# Patient Record
Sex: Female | Born: 1965
Health system: Southern US, Community
[De-identification: ages and names within clinical notes are randomized; demographics above are authoritative.]

## PROBLEM LIST (undated history)

## (undated) DIAGNOSIS — G43909 Migraine, unspecified, not intractable, without status migrainosus: Secondary | ICD-10-CM

## (undated) DIAGNOSIS — Z9884 Bariatric surgery status: Secondary | ICD-10-CM

## (undated) DIAGNOSIS — H579 Unspecified disorder of eye and adnexa: Secondary | ICD-10-CM

## (undated) DIAGNOSIS — D332 Benign neoplasm of brain, unspecified: Secondary | ICD-10-CM

## (undated) DIAGNOSIS — D649 Anemia, unspecified: Secondary | ICD-10-CM

## (undated) DIAGNOSIS — T7840XA Allergy, unspecified, initial encounter: Secondary | ICD-10-CM

## (undated) DIAGNOSIS — N920 Excessive and frequent menstruation with regular cycle: Secondary | ICD-10-CM

## (undated) DIAGNOSIS — I8390 Asymptomatic varicose veins of unspecified lower extremity: Secondary | ICD-10-CM

## (undated) DIAGNOSIS — D509 Iron deficiency anemia, unspecified: Secondary | ICD-10-CM

## (undated) DIAGNOSIS — IMO0002 Reserved for concepts with insufficient information to code with codable children: Secondary | ICD-10-CM

## (undated) DIAGNOSIS — N979 Female infertility, unspecified: Secondary | ICD-10-CM

## (undated) DIAGNOSIS — Z974 Presence of external hearing-aid: Secondary | ICD-10-CM

## (undated) DIAGNOSIS — R42 Dizziness and giddiness: Secondary | ICD-10-CM

## (undated) DIAGNOSIS — R000 Tachycardia, unspecified: Secondary | ICD-10-CM

## (undated) HISTORY — PX: DIAGNOSTIC LAPAROSCOPY: SUR761

## (undated) HISTORY — DX: Tachycardia, unspecified: R00.0

## (undated) HISTORY — DX: Dizziness and giddiness: R42

## (undated) HISTORY — DX: Presence of external hearing-aid: Z97.4

## (undated) HISTORY — DX: Reserved for concepts with insufficient information to code with codable children: IMO0002

## (undated) HISTORY — DX: Unspecified disorder of eye and adnexa: H57.9

## (undated) HISTORY — DX: Benign neoplasm of brain, unspecified: D33.2

## (undated) HISTORY — DX: Migraine, unspecified, not intractable, without status migrainosus: G43.909

## (undated) HISTORY — DX: Morbid (severe) obesity due to excess calories: E66.01

## (undated) HISTORY — PX: NASAL SINUS SURGERY: SHX719

## (undated) HISTORY — PX: TONSILLECTOMY: SUR1361

## (undated) HISTORY — DX: Allergy, unspecified, initial encounter: T78.40XA

## (undated) HISTORY — DX: Anemia, unspecified: D64.9

## (undated) HISTORY — DX: Female infertility, unspecified: N97.9

## (undated) HISTORY — DX: Asymptomatic varicose veins of unspecified lower extremity: I83.90

## (undated) HISTORY — DX: Excessive and frequent menstruation with regular cycle: N92.0

---
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
## 1992-10-19 HISTORY — PX: LAPAROSCOPIC CHOLECYSTECTOMY: SUR755

## 2007-10-02 DIAGNOSIS — J309 Allergic rhinitis, unspecified: Secondary | ICD-10-CM | POA: Insufficient documentation

## 2009-08-19 ENCOUNTER — Encounter: Payer: Self-pay | Admitting: Obstetrics & Gynecology

## 2009-08-19 ENCOUNTER — Ambulatory Visit (HOSPITAL_COMMUNITY): Admission: RE | Admit: 2009-08-19 | Discharge: 2009-08-19 | Payer: Self-pay | Admitting: Internal Medicine

## 2009-09-04 ENCOUNTER — Ambulatory Visit: Admission: RE | Admit: 2009-09-04 | Discharge: 2009-09-04 | Payer: Self-pay | Admitting: Gynecologic Oncology

## 2009-09-18 HISTORY — PX: LAPAROSCOPIC RADICAL TOTAL HYSTERECTOMY W/ NODE BIOPSY: SHX1934

## 2009-09-24 ENCOUNTER — Ambulatory Visit (HOSPITAL_COMMUNITY): Admission: AD | Admit: 2009-09-24 | Discharge: 2009-09-25 | Payer: Self-pay | Admitting: Obstetrics & Gynecology

## 2009-09-24 ENCOUNTER — Encounter: Payer: Self-pay | Admitting: Obstetrics & Gynecology

## 2010-01-15 ENCOUNTER — Other Ambulatory Visit: Admission: RE | Admit: 2010-01-15 | Discharge: 2010-01-15 | Payer: Self-pay | Admitting: Gynecologic Oncology

## 2010-01-15 ENCOUNTER — Ambulatory Visit: Admission: RE | Admit: 2010-01-15 | Discharge: 2010-01-15 | Payer: Self-pay | Admitting: Gynecologic Oncology

## 2010-06-19 HISTORY — PX: ROUX-EN-Y GASTRIC BYPASS: SHX1104

## 2010-07-19 DIAGNOSIS — Z9884 Bariatric surgery status: Secondary | ICD-10-CM

## 2010-07-19 HISTORY — DX: Bariatric surgery status: Z98.84

## 2010-12-18 HISTORY — PX: HERNIA REPAIR: SHX51

## 2010-12-24 ENCOUNTER — Other Ambulatory Visit (HOSPITAL_COMMUNITY)
Admission: RE | Admit: 2010-12-24 | Discharge: 2010-12-24 | Disposition: A | Discharge status: Home / Self Care | Payer: 59 | Source: Ambulatory Visit | Attending: Gynecologic Oncology | Admitting: Gynecologic Oncology

## 2010-12-24 ENCOUNTER — Other Ambulatory Visit: Payer: Self-pay | Admitting: Gynecologic Oncology

## 2010-12-24 ENCOUNTER — Ambulatory Visit: Payer: 59 | Attending: Gynecologic Oncology | Admitting: Gynecologic Oncology

## 2010-12-24 DIAGNOSIS — K5900 Constipation, unspecified: Secondary | ICD-10-CM | POA: Insufficient documentation

## 2010-12-24 DIAGNOSIS — Z9884 Bariatric surgery status: Secondary | ICD-10-CM | POA: Insufficient documentation

## 2010-12-24 DIAGNOSIS — Z8540 Personal history of malignant neoplasm of unspecified female genital organ: Secondary | ICD-10-CM | POA: Insufficient documentation

## 2010-12-24 DIAGNOSIS — C549 Malignant neoplasm of corpus uteri, unspecified: Secondary | ICD-10-CM | POA: Insufficient documentation

## 2010-12-24 DIAGNOSIS — Z9079 Acquired absence of other genital organ(s): Secondary | ICD-10-CM | POA: Insufficient documentation

## 2011-01-09 NOTE — Consult Note (Signed)
NAMEBRYLIN, STOPPER               ACCOUNT NO.:  [id number]  MEDICAL RECORD NO.:  [id number]          PATIENT TYPE:  LOCATION:                                 FACILITY:  PHYSICIAN:  Shawanda Sievert A. Duard Brady, MD    DATE OF BIRTH:  February 09, 1966  DATE OF CONSULTATION:  12/24/2010 DATE OF DISCHARGE:                                CONSULTATION   Judith Holland is a very pleasant 45 year old with stage IA grade 1 endometrioid adenocarcinoma.  She underwent laparoscopic hysterectomy, BSO and pelvic lymph node dissection in December 2010.  There was no residual tumor and she was a stage IA1.  The myometrium revealed adenomyosis, 13 lymph nodes were negative as were her washings.  I last saw her in March 2011 at which time her examination was unremarkable as was her Pap smears. She was seen by Dr. Hyacinth Meeker in September similarly with a negative examination.  She comes in today for followup.  Since we last saw her she has undergone a gastric bypass and has lost 110 pounds.  She also had a hernia repair at that time.  She underwent a Roux-en-Y.  She states that she was in the hospital for 6 days and really took about 6 weeks to recover, but she has been very pleased with how she has been doing.  She has not really started doing any strength exercises, but has really just been dietary modifications.  She had had an episode of bleeding last week.  It was not around the time of intercourse.  She was seen by Dr. Tresa Res in the clinic on Friday, who told her that she had some granulation tissue at the top of the vagina.  This was treated with silver nitrate.  She has had no bleeding since that time.  She does complain of some increased, what her husband perceives as vaginal dryness, though she herself has not been that cognizant of it.  She has had increasing issues with constipation since her surgery and she is on multiple stool softeners.  She does have followup with her surgeons coming up later this week for  evaluation status post hernia repair.  She is not sure if what she is feeling is just laxity in the abdominal wall versus a recurrent hernia.  They did repair the hernia, but did not put any mesh in at the time of her Roux-en-Y, as the Roux-en-Y was a contaminated case.  She otherwise denies any significant change in her bowel or bladder habits, any nausea, vomiting, fevers, chills.  PHYSICAL EXAMINATION:  VITAL SIGNS:  Weight 231 pounds, height 5 feet 7 for a BMI of 35, down from a BMI of 54.  Blood pressure 118/78, pulse 68, respirations 16, temperature 97.9. GENERAL:  A well-nourished, well-developed female in no acute distress. NECK:  Supple.  There is no lymphadenopathy.  No thyromegaly. LUNGS:  Clear to auscultation bilaterally. CARDIOVASCULAR:  Regular rate and rhythm. ABDOMEN:  Notable for well-healed surgical incisions.  The abdomen does have a sense of a protuberance supraumbilical that could just be a diastasis versus a laxity in the abdominal wall.  I do not appreciate a  distinct hernia.  The abdomen is soft, nontender, nondistended.  There is no hepatosplenomegaly. GROINS:  Negative for adenopathy. EXTREMITIES:  She has a compression stocking on her right leg status post varicose vein treatment. PELVIC:  External genitalia is within normal limits.  The vagina is somewhat atrophic.  The vaginal cuff is visualized.  There is no visible lesions.  ThinPrep Pap was submitted without difficulty.  Bimanual examination reveals no masses or nodularity.  Rectal confirms.  ASSESSMENT:  A 45 year old with a stage IA grade 1 endometrioid adenocarcinoma diagnosed and treated a year ago who clinically has no evidence of recurrent disease.  PLAN: 1. We will follow up on the results of her Pap smear from today.  She     will see Dr. Hyacinth Meeker in 6 months and return to see [place] in one year. 2. She was given some samples of lubricants to use for intercourse     p.r.n..  If the vaginal dryness  gets progressively worse, we could     always consider some topical estrogen in the vagina. 3. She was congratulated on her weight loss success.  She states that     she thinks she might want to lose about 60 to 70 more pounds, but     is really not focusing on the scale.  She really just wants to see     how she looks and see how she feels.     [name], MD     PAG/MEDQ  D:  12/24/2010  T:  12/24/2010  Job:  [id number]  cc:   Telford Nab, R.N. [address] [place], [place] [address]  M. Leda Quail, MD Fax: [phone]  Electronically Signed by Cleda Mccreedy MD on 12/25/2010 12:16:25 PM

## 2011-01-20 LAB — COMPREHENSIVE METABOLIC PANEL
AST: 28 U/L (ref 0–37)
Alkaline Phosphatase: 51 U/L (ref 39–117)
CO2: 30 mEq/L (ref 19–32)
Chloride: 102 mEq/L (ref 96–112)
Creatinine, Ser: 0.93 mg/dL (ref 0.4–1.2)
GFR calc Af Amer: >60 mL/min (ref >60)
GFR calc non Af Amer: >60 mL/min (ref >60)
Total Bilirubin: 0.9 mg/dL (ref 0.3–1.2)

## 2011-01-20 LAB — DIFFERENTIAL
Basophils Absolute: 0 10*3/uL (ref 0.0–0.1)
Basophils Relative: 0 % (ref 0–1)
Eosinophils Absolute: 0.1 10*3/uL (ref 0.0–0.7)
Eosinophils Relative: 1 % (ref 0–5)
Lymphocytes Relative: 18 % (ref 12–46)

## 2011-01-20 LAB — CBC
HCT: 38.8 % (ref 36.0–46.0)
HCT: 43 % (ref 36.0–46.0)
MCHC: 33.5 g/dL (ref 30.0–36.0)
MCV: 96.4 fL (ref 78.0–100.0)
MCV: 96.8 fL (ref 78.0–100.0)
Platelets: 193 10*3/uL (ref 150–400)
RBC: 4.46 MIL/uL (ref 3.87–5.11)
RDW: 12.4 % (ref 11.5–15.5)
WBC: 7.7 10*3/uL (ref 4.0–10.5)

## 2011-01-20 LAB — TYPE AND SCREEN: ABO/RH(D): A POS

## 2011-01-20 LAB — PREGNANCY, URINE

## 2011-01-22 LAB — URINALYSIS, ROUTINE W REFLEX MICROSCOPIC
Bilirubin Urine: NEGATIVE
Glucose, UA: NEGATIVE mg/dL
Ketones, ur: NEGATIVE mg/dL
Protein, ur: NEGATIVE mg/dL
Urobilinogen, UA: 0.2 mg/dL (ref 0.0–1.0)

## 2011-01-22 LAB — CBC
Hemoglobin: 14.7 g/dL (ref 12.0–15.0)
MCHC: 33.9 g/dL (ref 30.0–36.0)
MCV: 97.2 fL (ref 78.0–100.0)
RBC: 4.47 MIL/uL (ref 3.87–5.11)
WBC: 5.8 10*3/uL (ref 4.0–10.5)

## 2011-01-22 LAB — BASIC METABOLIC PANEL
CO2: 30 mEq/L (ref 19–32)
Calcium: 9.5 mg/dL (ref 8.4–10.5)
Chloride: 101 mEq/L (ref 96–112)
GFR calc Af Amer: >60 mL/min (ref >60)
Sodium: 135 mEq/L (ref 135–145)

## 2011-01-22 LAB — URINE MICROSCOPIC-ADD ON

## 2011-09-15 ENCOUNTER — Other Ambulatory Visit: Payer: Self-pay | Admitting: Dermatology

## 2011-10-22 ENCOUNTER — Telehealth: Payer: Self-pay | Admitting: *Deleted

## 2011-10-22 NOTE — Telephone Encounter (Signed)
Pt called sent chart for review of Dr. Myna Hidalgo. Per MD pt should continue follow up with PCP and if count drops further or she has any symptoms to call and schedule appointment. Pt aware

## 2011-12-31 ENCOUNTER — Ambulatory Visit: Payer: 59 | Admitting: Gynecologic Oncology

## 2012-02-09 ENCOUNTER — Encounter: Payer: Self-pay | Admitting: Gynecologic Oncology

## 2012-02-10 ENCOUNTER — Ambulatory Visit: Payer: 59 | Attending: Gynecologic Oncology | Admitting: Gynecologic Oncology

## 2012-02-10 ENCOUNTER — Encounter: Payer: Self-pay | Admitting: Gynecologic Oncology

## 2012-02-10 ENCOUNTER — Other Ambulatory Visit (HOSPITAL_COMMUNITY)
Admission: RE | Admit: 2012-02-10 | Discharge: 2012-02-10 | Disposition: A | Discharge status: Home / Self Care | Payer: 59 | Source: Ambulatory Visit | Attending: Gynecologic Oncology | Admitting: Gynecologic Oncology

## 2012-02-10 VITALS — BP 120/80 | HR 68 | Temp 98.0°F | Resp 18 | Ht 67.0 in | Wt 167.4 lb

## 2012-02-10 DIAGNOSIS — C549 Malignant neoplasm of corpus uteri, unspecified: Secondary | ICD-10-CM | POA: Insufficient documentation

## 2012-02-10 DIAGNOSIS — C541 Malignant neoplasm of endometrium: Secondary | ICD-10-CM

## 2012-02-10 DIAGNOSIS — Z01419 Encounter for gynecological examination (general) (routine) without abnormal findings: Secondary | ICD-10-CM | POA: Insufficient documentation

## 2012-02-10 DIAGNOSIS — Z79899 Other long term (current) drug therapy: Secondary | ICD-10-CM | POA: Insufficient documentation

## 2012-02-10 DIAGNOSIS — Z8542 Personal history of malignant neoplasm of other parts of uterus: Secondary | ICD-10-CM | POA: Insufficient documentation

## 2012-02-10 DIAGNOSIS — Z9884 Bariatric surgery status: Secondary | ICD-10-CM | POA: Insufficient documentation

## 2012-02-10 DIAGNOSIS — Z90710 Acquired absence of both cervix and uterus: Secondary | ICD-10-CM | POA: Insufficient documentation

## 2012-02-10 DIAGNOSIS — Z9079 Acquired absence of other genital organ(s): Secondary | ICD-10-CM | POA: Insufficient documentation

## 2012-02-10 NOTE — Progress Notes (Signed)
Consult Note: Gyn-Onc  Judith Holland 46 y.o. female  CC:  Chief Complaint  Patient presents with  . Endo ca    Follow up    HPI: Judith Holland is a very pleasant 46 year old with stage IA grade 1 endometrioid adenocarcinoma. She underwent laparoscopic hysterectomy, BSO and pelvic lymph node dissection in December 2010. There was no residual tumor and she was a stage IA1. The myometrium revealed adenomyosis, 13 lymph nodes were negative as were her washings. I last saw her in March 2012 at which time her examination was unremarkable as was her Pap smears. She was seen by Dr. Hyacinth Meeker in September similarly with a negative examination. She comes in today for followup. Since we last saw her she has undergone a gastric bypass and has lost and additional 70 pounds (total weight loss 180#). She also had a hernia repair at that time. She underwent a Roux-en-Y.   She has had increasing issues with constipation since her surgery and she is on multiple stool softeners. She does have some work related stress. She is at her goal weight. She's not doing strengthening exercises as often as she would like, however, she is walking regularly. She had a colonoscopy with Dr. Loreta Ave in December 2012 and the recommendation was for repeat in 5 years. She is up-to-date on her mammograms. Her liver function tests are slightly elevated and her white blood count is slightly decreased. This is felt to be secondary to weight loss. Her vitamin D level is normal and her other labs are unremarkable. She otherwise denies any significant change in her bowel or bladder habits, any nausea, vomiting, fevers, chills.   Interval History: As above  Review of Systems: As above  Current Meds:  Outpatient Encounter Prescriptions as of 02/10/2012  Medication Sig Dispense Refill  . buPROPion (WELLBUTRIN XL) 300 MG 24 hr tablet Take 300 mg by mouth daily.      . DULoxetine (CYMBALTA) 60 MG capsule Take 60 mg by mouth daily.      Marland Kitchen estradiol  (VIVELLE-DOT) 0.1 MG/24HR Place 1 patch onto the skin 2 (two) times a week.      . Fiber CHEW Chew 1 tablet by mouth daily.      . Fluticasone Propionate (FLONASE NA) Place into the nose 2 (two) times daily.      . Multiple Vitamins-Minerals (MULTIVITAMIN PO) Take 1 tablet by mouth daily.        Allergy: No Known Allergies  Social Hx:   History   Social History  . Marital Status: Married    Spouse Name: N/A    Number of Children: N/A  . Years of Education: N/A   Occupational History  . Not on file.   Social History Main Topics  . Smoking status: Never Smoker   . Smokeless tobacco: Not on file  . Alcohol Use: No  . Drug Use: No  . Sexually Active: Yes   Other Topics Concern  . Not on file   Social History Narrative  . No narrative on file    Past Surgical Hx:  Past Surgical History  Procedure Date  . Cesarean section     x2  . Diagnostic laparoscopy     for infertility that was negative  . Cholecystectomy 1994  . Tonsillectomy   . Nasal sinus surgery   . Abdominal hysterectomy 09/2009    Lap hyst, BSO, LND  . Roux-en-y gastric bypass   . Hernia repair     Past Medical Hx:  Past Medical History  Diagnosis Date  . Morbid obesity   . Rapid heartbeat   . Endometrioid adenocarcinoma     Stage IA grade 1  . Varicose veins     Family Hx:  Family History  Problem Relation Age of Onset  . Coronary artery disease Mother   . Colon cancer Father   . Coronary artery disease Father   . Diabetes Father   . Lung cancer Paternal Grandmother   . Prostate cancer Paternal Grandfather     Vitals:  Blood pressure 120/80, pulse 68, temperature 98 F (36.7 C), temperature source Oral, resp. rate 18, height 5\' 7"  (1.702 m), weight 167 lb 6.4 oz (75.932 kg).  Physical Exam: GENERAL: A well-nourished, well-developed female in no acute distress.  NECK: Supple. There is no lymphadenopathy. No thyromegaly.  LUNGS: Clear to auscultation bilaterally.  CARDIOVASCULAR:  Regular rate and rhythm.  ABDOMEN: Notable for well-healed surgical incisions. The abdomen does  have a sense of a protuberance supraumbilical that could just be a  diastasis versus a laxity in the abdominal wall. I do not appreciate a  distinct hernia. The abdomen is soft, nontender, nondistended. There  is no hepatosplenomegaly.  GROINS: Negative for adenopathy.  EXTREMITIES: She has a compression stocking on her right leg status  post varicose vein treatment.  PELVIC: External genitalia is within normal limits. The vagina is  somewhat atrophic. The vaginal cuff is visualized. There is no visible  lesions. ThinPrep Pap was submitted without difficulty. Bimanual  examination reveals no masses or nodularity. Rectal confirms.     Assessment/Plan:  ASSESSMENT: A 46 year old with a stage IA grade 1 endometrioid  adenocarcinoma diagnosed and treated 2.5 years ago who clinically has no  evidence of recurrent disease.  PLAN:  1. We will follow up on the results of her Pap smear from today. She  will see Dr. Hyacinth Meeker in 6 months and return to see [place] in one year.   Thornton Dohrmann A., MD 02/10/2012, 8:30 AM

## 2012-02-10 NOTE — Patient Instructions (Signed)
Return to clinic in 6 months with Dr. Hyacinth Meeker returned to our clinic in one year.

## 2012-02-10 NOTE — Progress Notes (Signed)
Addended by: Warner Mccreedy D on: 02/10/2012 08:42 AM   Modules accepted: Orders

## 2012-02-15 ENCOUNTER — Telehealth: Payer: Self-pay | Admitting: Gynecologic Oncology

## 2012-02-15 NOTE — Telephone Encounter (Signed)
Pt notified about pap results: negative.  No questions or concerns voiced. 

## 2012-12-21 ENCOUNTER — Encounter: Payer: Self-pay | Admitting: Obstetrics & Gynecology

## 2012-12-24 ENCOUNTER — Other Ambulatory Visit: Payer: Self-pay | Admitting: Obstetrics and Gynecology

## 2012-12-24 ENCOUNTER — Other Ambulatory Visit: Payer: Self-pay | Admitting: Obstetrics & Gynecology

## 2013-01-24 ENCOUNTER — Telehealth: Payer: Self-pay | Admitting: *Deleted

## 2013-01-24 MED ORDER — FROVATRIPTAN SUCCINATE 2.5 MG PO TABS: 2.5000 mg | ORAL_TABLET | ORAL | Status: DC | PRN

## 2013-01-24 MED ORDER — PROMETHAZINE HCL 25 MG PO TABS: 25.0000 mg | ORAL_TABLET | Freq: Four times a day (QID) | ORAL | Status: DC | PRN

## 2013-01-24 NOTE — Telephone Encounter (Signed)
Okay to call in Phenergan po 25mg  q 6 hours OR phenergan suppositories 25mg  pr x 6hr prn nausea.  #20/0RF.  Frova 2.5mg  at headache onset, may repeat 2 hours.  #9/1RF.  Thanks.

## 2013-01-24 NOTE — Telephone Encounter (Signed)
Left message for patient to call back with choice of med. sue

## 2013-01-24 NOTE — Addendum Note (Signed)
Addended by: Elnora Morrison on: 01/24/2013 10:00 AM   Modules accepted: Orders

## 2013-01-24 NOTE — Telephone Encounter (Signed)
Medication as ordered per Dr.Miller phoned in to [facility]. Patient request Phenergan tablets. sue

## 2013-01-24 NOTE — Telephone Encounter (Signed)
SALLY TEXT THIS MORNING TO LINDA OF STILL NOT FEELING WELL AND ASK IF YOU COULD CALL IN FOR HER PHENERGAN FOR STOMACHACHE AND HEADACHE. IN THE PAST DR. Tresa Res GAVE HER FROVA FOR HEADACHE AND IS REQUESTING THIS AGAIN IF YOU CAN.. NOT RESTING WELL WITH STOMACHACHE AND HEADACHE. PHARMACY IS [facility]. PLEASE ADVISE AND I WILL CALL HER . SUE. CHART IN YOUR OFFICE. THANKS

## 2013-03-23 ENCOUNTER — Other Ambulatory Visit: Payer: Self-pay

## 2013-03-23 MED ORDER — FLUTICASONE PROPIONATE 50 MCG/ACT NA SUSP: 2.0000 | Freq: Every day | NASAL | Status: DC

## 2013-03-23 MED ORDER — ZOLPIDEM TARTRATE 10 MG PO TABS: 10.0000 mg | ORAL_TABLET | Freq: Every evening | ORAL | Status: DC | PRN

## 2013-03-23 NOTE — Telephone Encounter (Signed)
Patient would like a 90 day supply per insurance

## 2013-03-27 ENCOUNTER — Other Ambulatory Visit: Payer: Self-pay | Admitting: Obstetrics and Gynecology

## 2013-03-27 NOTE — Telephone Encounter (Signed)
Last Aex 08/22/12 No Aex scheduled

## 2013-03-28 ENCOUNTER — Other Ambulatory Visit: Payer: Self-pay | Admitting: Gynecology

## 2013-03-28 MED ORDER — ESTRADIOL 0.1 MG/24HR TD PTTW: 1.0000 | MEDICATED_PATCH | TRANSDERMAL | Status: DC

## 2013-08-14 ENCOUNTER — Telehealth: Payer: Self-pay | Admitting: Emergency Medicine

## 2013-08-14 MED ORDER — ALPRAZOLAM 0.5 MG PO TABS: ORAL_TABLET | ORAL | Status: DC

## 2013-08-14 MED ORDER — FLUTICASONE PROPIONATE 50 MCG/ACT NA SUSP: 2.0000 | Freq: Every day | NASAL | Status: DC

## 2013-08-14 NOTE — Telephone Encounter (Signed)
Patient calling with request for Flonase refill.   Order placed, please co-sign. Thank you.

## 2013-08-14 NOTE — Telephone Encounter (Signed)
RX done.  Will be faxed to pharmacy.

## 2013-08-14 NOTE — Telephone Encounter (Signed)
Patient requesting refills for Xanax. Can you place Refill?

## 2013-08-14 NOTE — Telephone Encounter (Signed)
Patient requesting refill xanax. Okay to order per Dr. Farrel Gobble.   Dispense 40, rf0

## 2013-08-14 NOTE — Addendum Note (Signed)
Addended by: Jerene Bears on: 08/14/2013 01:09 PM   Modules accepted: Orders

## 2013-08-24 ENCOUNTER — Other Ambulatory Visit: Payer: Self-pay | Admitting: Emergency Medicine

## 2013-08-24 MED ORDER — ESTRADIOL 0.1 MG/24HR TD PTTW: 1.0000 | MEDICATED_PATCH | TRANSDERMAL | Status: DC

## 2013-08-24 NOTE — Telephone Encounter (Signed)
Rx for branded Minivelle to pharmacy.

## 2013-08-24 NOTE — Telephone Encounter (Addendum)
Patient requesting refill. Due for annual, she is aware. Annual Scheduled.  Order placed. Please review and  co-sign if you agree.

## 2013-09-27 ENCOUNTER — Encounter: Payer: Self-pay | Admitting: Obstetrics & Gynecology

## 2013-09-28 ENCOUNTER — Ambulatory Visit: Payer: Self-pay | Admitting: Obstetrics & Gynecology

## 2013-10-17 ENCOUNTER — Other Ambulatory Visit: Payer: Self-pay | Admitting: Emergency Medicine

## 2013-10-17 DIAGNOSIS — Z9884 Bariatric surgery status: Secondary | ICD-10-CM

## 2013-10-17 MED ORDER — ZOLPIDEM TARTRATE ER 12.5 MG PO TBCR: 12.5000 mg | EXTENDED_RELEASE_TABLET | Freq: Every evening | ORAL | Status: DC | PRN

## 2013-10-17 MED ORDER — ALPRAZOLAM 0.5 MG PO TABS: ORAL_TABLET | ORAL | Status: DC

## 2013-10-17 MED ORDER — BUPROPION HCL ER (XL) 300 MG PO TB24: 300.0000 mg | ORAL_TABLET | Freq: Every day | ORAL | Status: DC

## 2013-10-17 MED ORDER — SPIRONOLACTONE 100 MG PO TABS: 100.0000 mg | ORAL_TABLET | Freq: Every day | ORAL | Status: DC

## 2013-10-17 MED ORDER — SUMATRIPTAN SUCCINATE 100 MG PO TABS: 100.0000 mg | ORAL_TABLET | ORAL | Status: DC | PRN

## 2013-10-17 MED ORDER — DULOXETINE HCL 60 MG PO CPEP: 60.0000 mg | ORAL_CAPSULE | Freq: Every day | ORAL | Status: DC

## 2013-10-17 NOTE — Telephone Encounter (Signed)
Patient needs refills of medications. Has met deductible for year and needs to use FSA benefits prior to end of year.  AEX scheduled for 11/16/13.   Refills of Ambien 12.5 mg CR for 90 days.  Cymbalta 60 mg for 90 days.  Wellbutrin 300 mg 24 hours for 90 days.  Aldactone 100 mg for 90 days.    Patient also requests having bariatric labs drawn.  States will come in for fasting labs tomorrow.  Bariatic yearly labs on paper chart to your office. Orders placed and pended for your review.

## 2013-10-18 ENCOUNTER — Other Ambulatory Visit: Payer: Self-pay | Admitting: Obstetrics & Gynecology

## 2013-10-18 ENCOUNTER — Other Ambulatory Visit (INDEPENDENT_AMBULATORY_CARE_PROVIDER_SITE_OTHER): Payer: 59

## 2013-10-18 DIAGNOSIS — Z0183 Encounter for blood typing: Secondary | ICD-10-CM

## 2013-10-18 DIAGNOSIS — Z9884 Bariatric surgery status: Secondary | ICD-10-CM

## 2013-10-18 LAB — COMPREHENSIVE METABOLIC PANEL
Albumin: 4.3 g/dL (ref 3.5–5.2)
Alkaline Phosphatase: 66 U/L (ref 39–117)
BUN: 13 mg/dL (ref 6–23)
Glucose, Bld: 92 mg/dL (ref 70–99)
Potassium: 4.2 mEq/L (ref 3.5–5.3)
Total Bilirubin: 0.8 mg/dL (ref 0.3–1.2)
Total Protein: 6.8 g/dL (ref 6.0–8.3)

## 2013-10-18 LAB — HEMOGLOBIN A1C: Hgb A1c MFr Bld: 5.3 % (ref <5.7)

## 2013-10-18 LAB — HEMATOCRIT: HCT: 42.4 % (ref 36.0–46.0)

## 2013-10-18 LAB — LIPID PANEL
HDL: 71 mg/dL (ref >39)
LDL Cholesterol: 104 mg/dL — ABNORMAL HIGH (ref 0–99)
VLDL: 16 mg/dL (ref 0–40)

## 2013-10-18 LAB — FOLATE: Folate: 9.1 ng/mL

## 2013-10-18 LAB — VITAMIN B12: Vitamin B-12: 325 pg/mL (ref 211–911)

## 2013-10-19 LAB — VITAMIN D 25 HYDROXY (VIT D DEFICIENCY, FRACTURES): Vit D, 25-Hydroxy: 27 ng/mL — ABNORMAL LOW (ref 30–89)

## 2013-10-20 ENCOUNTER — Other Ambulatory Visit: Payer: Self-pay | Admitting: Obstetrics and Gynecology

## 2013-10-20 ENCOUNTER — Encounter: Payer: Self-pay | Admitting: Obstetrics & Gynecology

## 2013-10-20 DIAGNOSIS — R7989 Other specified abnormal findings of blood chemistry: Secondary | ICD-10-CM

## 2013-10-20 LAB — PARATHYROID HORMONE, INTACT (NO CA): PTH: 86.9 pg/mL — ABNORMAL HIGH (ref 14.0–72.0)

## 2013-10-20 LAB — HEMOGLOBIN: Hemoglobin: 14.7 g/dL (ref 12.0–15.0)

## 2013-10-24 LAB — VITAMIN A: Vitamin A (Retinoic Acid): 40 ug/dL (ref 38–98)

## 2013-10-24 LAB — VITAMIN E
Gamma-Tocopherol (Vit E): 1.2 mg/L (ref <=4.3)
Vitamin E (Alpha Tocopherol): 9.4 mg/L (ref 5.7–19.9)

## 2013-10-25 LAB — VITAMIN B6: Vitamin B6: 6.9 ng/mL (ref 2.1–21.7)

## 2013-11-10 ENCOUNTER — Encounter (INDEPENDENT_AMBULATORY_CARE_PROVIDER_SITE_OTHER): Payer: Self-pay | Admitting: Surgery

## 2013-11-10 ENCOUNTER — Ambulatory Visit (INDEPENDENT_AMBULATORY_CARE_PROVIDER_SITE_OTHER): Payer: Commercial Managed Care - PPO | Admitting: Surgery

## 2013-11-10 VITALS — BP 126/84 | HR 80 | Temp 98.4°F | Resp 14 | Ht 67.0 in | Wt 225.2 lb

## 2013-11-10 DIAGNOSIS — E559 Vitamin D deficiency, unspecified: Secondary | ICD-10-CM

## 2013-11-10 DIAGNOSIS — Z9884 Bariatric surgery status: Secondary | ICD-10-CM

## 2013-11-10 DIAGNOSIS — E215 Disorder of parathyroid gland, unspecified: Secondary | ICD-10-CM

## 2013-11-10 MED ORDER — VITAMIN D (ERGOCALCIFEROL) 1.25 MG (50000 UNIT) PO CAPS: 50000.0000 [IU] | ORAL_CAPSULE | ORAL | Status: DC

## 2013-11-10 NOTE — Progress Notes (Signed)
The patient comes in today for 2 reasons. First she is a post gastric bypass patient done by Dr. Armando Gang and September 2011. He initially was a 362 palpation to drop down into the 160s but is today at 225. She needs someone to follow her long-term.  The second issue is a very complete metabolic panel that was obtained by her physicians that showed a PTH level of 86.9 with a calcium of 9.4.  Her Vitamin D was low at 27.    I think this may be related to her low vitamin D level. I don't think she has hyperparathyroidism. Plan to try 50,000 units of ergocalciferol once a week for 15 weeks. She will then have a parathyroid hormone level, calcium level, and vitamin D level we drawn. I will see her back after that so that we can review the results.  I told her that I would try to update her procedure in our electronic health record and that we would be available to manage any post bypass issues that she had.  Plan return in 4 months

## 2013-11-16 ENCOUNTER — Ambulatory Visit: Payer: Self-pay | Admitting: Obstetrics & Gynecology

## 2013-11-29 ENCOUNTER — Encounter (INDEPENDENT_AMBULATORY_CARE_PROVIDER_SITE_OTHER): Payer: Self-pay

## 2013-12-05 ENCOUNTER — Ambulatory Visit: Payer: Self-pay | Admitting: Obstetrics & Gynecology

## 2013-12-09 ENCOUNTER — Other Ambulatory Visit: Payer: Self-pay | Admitting: Obstetrics & Gynecology

## 2013-12-09 MED ORDER — DULOXETINE HCL 60 MG PO CPEP: 60.0000 mg | ORAL_CAPSULE | Freq: Every day | ORAL | Status: DC

## 2013-12-09 MED ORDER — BUPROPION HCL ER (XL) 300 MG PO TB24: 300.0000 mg | ORAL_TABLET | Freq: Every day | ORAL | Status: DC

## 2013-12-09 NOTE — Progress Notes (Signed)
Pt called requesting rx for Wellbutrin 300 and cymbalta 60 called to [facility] on [address].  Rx usually comes from [facility].  Pt didn't know they were no longer open on Saturdays.  RF done as requested for 90 day.

## 2013-12-26 ENCOUNTER — Other Ambulatory Visit (INDEPENDENT_AMBULATORY_CARE_PROVIDER_SITE_OTHER): Payer: Self-pay

## 2013-12-26 DIAGNOSIS — E215 Disorder of parathyroid gland, unspecified: Secondary | ICD-10-CM

## 2013-12-26 DIAGNOSIS — Z9884 Bariatric surgery status: Secondary | ICD-10-CM

## 2013-12-26 DIAGNOSIS — E559 Vitamin D deficiency, unspecified: Secondary | ICD-10-CM

## 2013-12-26 NOTE — Addendum Note (Signed)
Addended by: Ivor Costa on: 12/26/2013 11:50 AM   Modules accepted: Orders

## 2014-01-12 ENCOUNTER — Other Ambulatory Visit: Payer: Self-pay | Admitting: Obstetrics & Gynecology

## 2014-01-23 ENCOUNTER — Encounter: Payer: Self-pay | Admitting: Obstetrics & Gynecology

## 2014-01-23 ENCOUNTER — Ambulatory Visit (INDEPENDENT_AMBULATORY_CARE_PROVIDER_SITE_OTHER): Payer: 59 | Admitting: Obstetrics & Gynecology

## 2014-01-23 VITALS — BP 120/82 | HR 80 | Temp 98.4°F | Resp 18 | Ht 67.0 in | Wt 228.0 lb

## 2014-01-23 DIAGNOSIS — Z124 Encounter for screening for malignant neoplasm of cervix: Secondary | ICD-10-CM

## 2014-01-23 DIAGNOSIS — R6889 Other general symptoms and signs: Secondary | ICD-10-CM

## 2014-01-23 DIAGNOSIS — R899 Unspecified abnormal finding in specimens from other organs, systems and tissues: Secondary | ICD-10-CM

## 2014-01-23 DIAGNOSIS — Z0000 Encounter for general adult medical examination without abnormal findings: Secondary | ICD-10-CM

## 2014-01-23 DIAGNOSIS — Z01419 Encounter for gynecological examination (general) (routine) without abnormal findings: Secondary | ICD-10-CM

## 2014-01-23 LAB — POCT URINALYSIS DIPSTICK
BILIRUBIN UA: NEGATIVE
Blood, UA: NEGATIVE
GLUCOSE UA: NEGATIVE
KETONES UA: NEGATIVE
Leukocytes, UA: NEGATIVE
Nitrite, UA: NEGATIVE
Protein, UA: NEGATIVE
Urobilinogen, UA: NEGATIVE
pH, UA: 5

## 2014-01-23 MED ORDER — FROVATRIPTAN SUCCINATE 2.5 MG PO TABS: 2.5000 mg | ORAL_TABLET | ORAL | Status: DC | PRN

## 2014-01-23 MED ORDER — EST ESTROGENS-METHYLTEST 1.25-2.5 MG PO TABS: 1.0000 | ORAL_TABLET | Freq: Every day | ORAL | Status: DC

## 2014-01-23 MED ORDER — BUPROPION HCL ER (XL) 300 MG PO TB24: 300.0000 mg | ORAL_TABLET | Freq: Every day | ORAL | Status: DC

## 2014-01-23 MED ORDER — DULOXETINE HCL 30 MG PO CPEP: 30.0000 mg | ORAL_CAPSULE | Freq: Every day | ORAL | Status: DC

## 2014-01-23 NOTE — Progress Notes (Signed)
48 y.o. G3P2 MarriedCaucasianF here for annual exam.  Last Pap 11/13.  No vaginal bleeding.  No painful intercourse.  Having some libido issues.  Used topical testosterone without success.  Feels like it made her varicose veins hurt more.    Questions Lynch syndrome testing.  Father with hx of colorectal cancer.  I have asked Dr. Alycia Rossetti about this in the past and she did not recommend it.    Feels if she is late changing a patch, she has a headache.  Would like to consider different HRT options.  Needs frova rx.  Used imitrex as is generic but patient had unacceptable side effects--took so long to wear off.  No LMP recorded. Patient has had a hysterectomy.          Sexually active: yes  The current method of family planning is status post hysterectomy.    Exercising: yes  weights, walk 2 days/ week Smoker:  no  Health Maintenance: Pap:  08/2012  History of abnormal Pap:  no MMG:  04/2013 BI-RADS 1: Neg Colonoscopy:  2012 BMD:   2011 TDaP:  06/17/10 Screening Labs: 09/2013, Hb 09/2013 : 14.7 , Urine today: Clear/ Neg   reports that she has never smoked. She has never used smokeless tobacco. She reports that she does not drink alcohol or use illicit drugs.  Past Medical History  Diagnosis Date  . Morbid obesity   . Rapid heartbeat   . Endometrioid adenocarcinoma     Stage IA grade 1  . Varicose veins   . Menorrhagia   . Migraines     migraine headaches  . Eye pressure     elevated  . Infertility, female     clomid and IUI x 3    Past Surgical History  Procedure Laterality Date  . Diagnostic laparoscopy      for infertility that was negative  . Cholecystectomy  1994  . Tonsillectomy    . Nasal sinus surgery    . Abdominal hysterectomy  09/2009    Lap hyst, BSO, LND  . Roux-en-y gastric bypass    . Hernia repair    . Cesarean section      x2    Current Outpatient Prescriptions  Medication Sig Dispense Refill  . ALPRAZolam (XANAX) 0.5 MG tablet Take one-half to one  tablet as needed for sleep or anxiety.  45 tablet  3  . buPROPion (WELLBUTRIN XL) 300 MG 24 hr tablet Take 1 tablet (300 mg total) by mouth daily.  90 tablet  0  . DULoxetine (CYMBALTA) 60 MG capsule Take 1 capsule (60 mg total) by mouth daily.  90 capsule  0  . estradiol (MINIVELLE) 0.1 MG/24HR patch Place 1 patch (0.1 mg total) onto the skin 2 (two) times a week.  24 patch  4  . fluticasone (FLONASE) 50 MCG/ACT nasal spray Place 2 sprays into the nose as needed.      . Multiple Vitamins-Minerals (MULTIVITAMIN PO) Take 1 tablet by mouth daily.      . promethazine (PHENERGAN) 25 MG tablet Take 1 tablet (25 mg total) by mouth every 6 (six) hours as needed for nausea.  20 tablet  0  . spironolactone (ALDACTONE) 100 MG tablet Take 1 tablet (100 mg total) by mouth daily.  90 tablet  3  . Vitamin D, Ergocalciferol, (DRISDOL) 50000 UNITS CAPS capsule Take 1 capsule (50,000 Units total) by mouth every 7 (seven) days.  15 capsule  1  . zolpidem (AMBIEN CR) 12.5 MG  CR tablet TAKE 1 TABLET BY MOUTH AT BEDTIME AS NEEDED FOR SLEEP  90 tablet  0   No current facility-administered medications for this visit.    Family History  Problem Relation Age of Onset  . Coronary artery disease Mother   . Heart disease Mother   . Colon cancer Father   . Coronary artery disease Father   . Diabetes Father   . Cancer Father     lung  . Heart disease Father   . Lung cancer Paternal Grandmother   . Prostate cancer Paternal Grandfather   . Cancer Paternal Grandfather     prostate  . Cancer Maternal Uncle     throat  . Cancer Maternal Grandmother     lung    ROS:  Pertinent items are noted in HPI.  Otherwise, a comprehensive ROS was negative.  Exam:   BP 120/82  Pulse 80  Resp 18  Ht 5\' 7"  (1.702 m)  Wt 228 lb (103.42 kg)  BMI 35.70 kg/m2  Weight change: +43#   Height: 5\' 7"  (170.2 cm)  Ht Readings from Last 3 Encounters:  01/23/14 5\' 7"  (1.702 m)  11/10/13 5\' 7"  (1.702 m)  02/10/12 5\' 7"  (1.702 m)     General appearance: alert, cooperative and appears stated age Head: Normocephalic, without obvious abnormality, atraumatic Neck: no adenopathy, supple, symmetrical, trachea midline and thyroid normal to inspection and palpation Lungs: clear to auscultation bilaterally Breasts: normal appearance, no masses or tenderness Heart: regular rate and rhythm Abdomen: soft, non-tender; bowel sounds normal; no masses,  no organomegaly Extremities: extremities normal, atraumatic, no cyanosis or edema Skin: Skin color, texture, turgor normal. No rashes or lesions Lymph nodes: Cervical, supraclavicular, and axillary nodes normal. No abnormal inguinal nodes palpated Neurologic: Grossly normal   Pelvic: External genitalia:  no lesions              Urethra:  normal appearing urethra with no masses, tenderness or lesions              Bartholins and Skenes: normal                 Vagina: normal appearing vagina with normal color and discharge, no lesions              Cervix: absent              Pap taken: yes Bimanual Exam:  Uterus:  uterus absent              Adnexa: no mass, fullness, tenderness               Rectovaginal: Confirms               Anus:  normal sphincter tone, no lesions  A:  Well Woman with normal exam H/O gastric bypass.  Currently still down #120 from surgery Osteopenia.  Check labs first.  Then see if needs BMD. On HRT Elevated PTH Decreased libido headaches H/O stage 1A endometrial adenocarcinoma Family hx of colon cancer.  Colonoscopy done 2012.  P:   mammogram pap smear Trial of estratest.  D/C Vivelle dot for now.   Frova 2.5mg  at headache onset.  Repeat 2 hours.  #27/4RF.  90 day supply Decreased Cymbalta to 30mg  daily.  Gay Filler will try to wean off of this if possible. wellbutrin Xl 300mg  daily.  90 day supply to pharmacy. PTH with calcium and Vit D.  Taking Vit D 50K weekly. return annually or prn  An  After Visit Summary was printed and given to the  patient.

## 2014-01-24 LAB — IPS PAP SMEAR ONLY

## 2014-01-25 LAB — VITAMIN D 25 HYDROXY (VIT D DEFICIENCY, FRACTURES): VIT D 25 HYDROXY: 27 ng/mL — AB (ref 30–89)

## 2014-01-25 LAB — PTH, INTACT AND CALCIUM
Calcium: 9.3 mg/dL (ref 8.4–10.5)
PTH: 52.4 pg/mL (ref 14.0–72.0)

## 2014-03-01 ENCOUNTER — Telehealth: Payer: Self-pay | Admitting: Obstetrics & Gynecology

## 2014-03-01 NOTE — Telephone Encounter (Signed)
Opened in error

## 2014-03-30 ENCOUNTER — Encounter: Payer: Self-pay | Admitting: Obstetrics & Gynecology

## 2014-03-30 MED ORDER — BUPROPION HCL ER (XL) 150 MG PO TB24: ORAL_TABLET | ORAL | Status: DC

## 2014-04-09 ENCOUNTER — Encounter: Payer: Self-pay | Admitting: Obstetrics & Gynecology

## 2014-04-09 MED ORDER — ZOLPIDEM TARTRATE 10 MG PO TABS: 10.0000 mg | ORAL_TABLET | Freq: Every evening | ORAL | Status: DC | PRN

## 2014-04-09 NOTE — Telephone Encounter (Signed)
Rx done for 10mg  and #90/0RF faxed to [facility].  No RF done in case need to increase back to the 12.5mg  dosage.

## 2014-04-11 ENCOUNTER — Ambulatory Visit: Payer: 59 | Admitting: Neurology

## 2014-04-16 ENCOUNTER — Encounter: Payer: Self-pay | Admitting: Neurology

## 2014-04-16 ENCOUNTER — Ambulatory Visit (INDEPENDENT_AMBULATORY_CARE_PROVIDER_SITE_OTHER): Payer: 59 | Admitting: Neurology

## 2014-04-16 VITALS — BP 134/90 | HR 75 | Ht 67.0 in | Wt 240.0 lb

## 2014-04-16 DIAGNOSIS — R269 Unspecified abnormalities of gait and mobility: Secondary | ICD-10-CM

## 2014-04-16 DIAGNOSIS — R2681 Unsteadiness on feet: Secondary | ICD-10-CM | POA: Insufficient documentation

## 2014-04-16 DIAGNOSIS — R42 Dizziness and giddiness: Secondary | ICD-10-CM

## 2014-04-16 MED ORDER — TOPIRAMATE 50 MG PO TABS: ORAL_TABLET | ORAL | Status: DC

## 2014-04-16 MED ORDER — RIZATRIPTAN BENZOATE 10 MG PO TBDP: 10.0000 mg | ORAL_TABLET | ORAL | Status: DC | PRN

## 2014-04-16 NOTE — Progress Notes (Signed)
PATIENT: Judith Holland DOB: [birth year]  HISTORICAL  Judith Holland is a 48 yo right-handed Caucasian female, referred by her primary care physician Dr. Melford Aase for evaluation of new-onset vertigo, loss of words.  She had a past medical history of obesity, status post gastric bypass surgery in 2011, with 200 pound weight loss, also with past medical history of depression, was on combination of Cymbalta, Wellbutrin treatment, recent adjustment of medication, tapering off Cymbalta, higher dose of Wellbutrin, 150 mg 3 tablets every day.  She presented recurrent episode of transient dizziness, since April 2015, the first episode happened while she rolled over at night in her bed, she felt her body was falling, few second, she was able to fall into sleep afterwards. Since the initial episode, she has multiple recurrent similar episode, transient, dizziness sensation, it can inducated by scanning quickly, or shifting her focus quickly.  She also reported episode of catching her right foot on the floor.  She had a history of migraine for many years, getting worse over the past one year, about 1-2 times each week, right retro-orbital area severe pounding headaches, with associated light noise sensitivity, lasting for 2-3 hours, usually relieved by lying down in dark quiet room, and sleep, she has tried Imitrex, not helpful, does not like the side effect, Triximet, which has been helpful, Frova, is helpful    REVIEW OF SYSTEMS: Full 14 system review of systems performed and notable only for insomnia allergies, runny nose  ALLERGIES: No Known Allergies  HOME MEDICATIONS: Current Outpatient Prescriptions on File Prior to Visit  Medication Sig Dispense Refill  . ALPRAZolam (XANAX) 0.5 MG tablet Take one-half to one tablet as needed for sleep or anxiety.  45 tablet  3  . buPROPion (WELLBUTRIN XL) 150 MG 24 hr tablet Take 3 tablets (424m) daily.  270 tablet  1  . estrogen-methylTESTOSTERone  (ESTRATEST) 1.25-2.5 MG per tablet Take 1 tablet by mouth daily.  90 tablet  4  . fluticasone (FLONASE) 50 MCG/ACT nasal spray Place 2 sprays into the nose as needed.      . Multiple Vitamins-Minerals (MULTIVITAMIN PO) Take 1 tablet by mouth daily.      .Marland Kitchenspironolactone (ALDACTONE) 100 MG tablet Take 1 tablet (100 mg total) by mouth daily.  90 tablet  3  . zolpidem (AMBIEN) 10 MG tablet Take 1 tablet (10 mg total) by mouth at bedtime as needed for sleep.  90 tablet  0    PAST MEDICAL HISTORY: Past Medical History  Diagnosis Date  . Morbid obesity   . Rapid heartbeat   . 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 Varicose veins   . Menorrhagia   . Migraines     migraine headaches  . Eye pressure     elevated  . Infertility, female     clomid and IUI x 3  . Vertigo     PAST SURGICAL HISTORY: Past Surgical History  Procedure Laterality Date  . Diagnostic laparoscopy      for infertility that was negative  . Laparoscopic cholecystectomy  1994  . Tonsillectomy      x 3  . Nasal sinus surgery    . Laparoscopic radical total hysterectomy w/ node biopsy  09/2009    with BSO  . 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 Hernia repair  3/12  . Cesarean section      x2    FAMILY HISTORY: Family History  Problem  Relation Age of Onset  . Coronary artery disease Mother   . Heart disease Mother   . Colon cancer Father   . Coronary artery disease Father   . Diabetes Father   . Cancer Father     lung  . Heart disease Father   . Lung cancer Paternal Grandmother   . Prostate cancer Paternal Grandfather   . Cancer Paternal Grandfather     prostate  . Cancer Maternal Uncle     throat  . Cancer Maternal Grandmother     lung    SOCIAL HISTORY:  History   Social History  . Marital Status: Married    Spouse Name: Merry Proud    Number of Children: 2  . Years of Education: college   Occupational History    [employer]   Social History Main Topics  .  Smoking status: Never Smoker   . Smokeless tobacco: Never Used  . Alcohol Use: No  . Drug Use: No  . Sexual Activity: Yes   Other Topics Concern  . Not on file   Social History Narrative   Patient lives at home with her husband Merry Proud). Patient has two children and works full time for [employer].   Caffeine patient drinks a very mild amount not daily.     PHYSICAL EXAM   Filed Vitals:   04/16/14 0904  BP: 134/90  Pulse: 75  Height: 5' 7"  (1.702 m)  Weight: 240 lb (108.863 kg)    Not recorded   Bp sitting 134/90, standing 120/84, lying down 107/67  Body mass index is 37.58 kg/(m^2).   Generalized: In no acute distress  Neck: Supple, no carotid bruits   Cardiac: Regular rate rhythm  Pulmonary: Clear to auscultation bilaterally  Musculoskeletal: No deformity  Neurological examination  Mentation: Alert oriented to time, place, history taking, and causual conversation  Cranial nerve II-XII: Pupils were equal round reactive to light. Extraocular movements were full.  Visual field were full on confrontational test. Bilateral fundi were sharp.  Facial sensation and strength were normal. Hearing was intact to finger rubbing bilaterally. Uvula tongue midline.  Head turning and shoulder shrug and were normal and symmetric.Tongue protrusion into cheek strength was normal.  Motor: Normal tone, bulk and strength.  Sensory: Intact to fine touch, pinprick, preserved vibratory sensation, and proprioception at toes.  Coordination: Normal finger to nose, heel-to-shin bilaterally there was no truncal ataxia  Gait: Rising up from seated position without assistance, normal stance, without trunk ataxia, moderate stride, good arm swing, smooth turning, able to perform tiptoe, and heel walking without difficulty.   Romberg signs: Negative  Deep tendon reflexes: Brachioradialis 3/3, biceps3/3, triceps 3/3, patellar3/3, Achilles 2/2, plantar responses were  flexor bilaterally.   DIAGNOSTIC DATA (LABS, IMAGING, TESTING) - I reviewed patient records, labs, notes, testing and imaging myself where available.  Lab Results  Component Value Date   WBC 10.8* 09/25/2009   HGB 14.7 10/18/2013   HCT 42.4 10/18/2013   MCV 96.8 09/25/2009   PLT 193 09/25/2009      Component Value Date/Time   NA 137 10/18/2013 1006   K 4.2 10/18/2013 1006   CL 99 10/18/2013 1006   CO2 28 10/18/2013 1006   GLUCOSE 92 10/18/2013 1006   BUN 13 10/18/2013 1006   CREATININE 0.88 10/18/2013 1006   CREATININE 0.93 09/20/2009 1200   CALCIUM 9.3 01/24/2014 0902   PROT 6.8 10/18/2013 1006   ALBUMIN 4.3 10/18/2013 1006  AST 34 10/18/2013 1006   ALT 24 10/18/2013 1006   ALKPHOS 66 10/18/2013 1006   BILITOT 0.8 10/18/2013 1006   GFRNONAA >60 09/20/2009 1200   GFRAA  Value: >60        The eGFR has been calculated using the MDRD equation. This calculation has not been validated in all clinical situations. eGFR's persistently <60 mL/min signify possible Chronic Kidney Disease. 09/20/2009 1200   Lab Results  Component Value Date   CHOL 191 10/18/2013   HDL 71 10/18/2013   LDLCALC 104* 10/18/2013   TRIG 81 10/18/2013   CHOLHDL 2.7 10/18/2013   Lab Results  Component Value Date   HGBA1C 5.3 10/18/2013   Lab Results  Component Value Date   VITAMINB12 325 10/18/2013   ASSESSMENT AND PLAN  WILLENA JEANCHARLES is a 48 y.o. female complains of  frequent recurrent episode of transient dizziness, she has past medical history of obesity, status post gastric bypass surgery, migraine headaches,   1. migraine headaches,  will add on preventive medication Topamax, Maxalt as needed, Riboflavin, magnesium oxide is also suggested. 2, her recurrent episodes of transient dizziness could due to menaces side effect, vestibular malfunction, she does show signs of brisk reflexes, we will proceed with MRI of the brain, 3. Return to clinic in one month   Marcial Pacas, M.D. Ph.D.  [facility] [address] [place], [place] [address] [phone]

## 2014-04-16 NOTE — Patient Instructions (Signed)
Magnesium oxide 400 mg twice a day Riboflavin  100 mg twice a day 

## 2014-04-18 ENCOUNTER — Ambulatory Visit (INDEPENDENT_AMBULATORY_CARE_PROVIDER_SITE_OTHER): Payer: 59

## 2014-04-18 DIAGNOSIS — R42 Dizziness and giddiness: Secondary | ICD-10-CM

## 2014-04-18 DIAGNOSIS — R269 Unspecified abnormalities of gait and mobility: Secondary | ICD-10-CM

## 2014-04-18 DIAGNOSIS — R2681 Unsteadiness on feet: Secondary | ICD-10-CM

## 2014-04-19 ENCOUNTER — Telehealth: Payer: Self-pay | Admitting: Neurology

## 2014-04-19 NOTE — Telephone Encounter (Signed)
I have called her, MRI brain showed   1. Multiple (>20) small, round, periventricular and subcortical and juxtacortical T2 hyperintensities. These findings are non-specific and considerations include autoimmune, inflammatory, post-infectious, microvascular ischemic or migraine associated etiologies.  2. No acute findings.  She has tried topamax, causing dizziness, she is titrating up dose slowly, no migraine in past few days. Keep July 30th 2015 appt, likely will initiate further evaluation for her abnormal MRI.

## 2014-04-22 ENCOUNTER — Encounter: Payer: Self-pay | Admitting: Neurology

## 2014-04-24 NOTE — Telephone Encounter (Signed)
I have called her again, explained MRI was mildly abnormal, likely needs more testing.  Hinton Dyer, Please give her a follow up visit with me. next Monday July 13th morning 8:30am, for me to go over MRI with her.

## 2014-04-25 ENCOUNTER — Ambulatory Visit (INDEPENDENT_AMBULATORY_CARE_PROVIDER_SITE_OTHER): Payer: 59 | Admitting: Neurology

## 2014-04-25 ENCOUNTER — Encounter: Payer: Self-pay | Admitting: Neurology

## 2014-04-25 VITALS — BP 136/87 | HR 73 | Ht 67.0 in | Wt 240.0 lb

## 2014-04-25 DIAGNOSIS — R2681 Unsteadiness on feet: Secondary | ICD-10-CM

## 2014-04-25 DIAGNOSIS — Z9884 Bariatric surgery status: Secondary | ICD-10-CM

## 2014-04-25 DIAGNOSIS — R42 Dizziness and giddiness: Secondary | ICD-10-CM

## 2014-04-25 DIAGNOSIS — R269 Unspecified abnormalities of gait and mobility: Secondary | ICD-10-CM

## 2014-04-25 NOTE — Progress Notes (Signed)
PATIENT: Judith Holland DOB: 1966-02-21  HISTORICAL  Judith Holland is a 48 yo right-handed Caucasian female, referred by her primary care physician Dr. Melford Aase for evaluation of new-onset vertigo, loss of words.  She had a past medical history of obesity, status post gastric bypass surgery in 2011, with 200 pound weight loss, also with past medical history of depression, was on combination of Cymbalta, Wellbutrin treatment, recent adjustment of medication, tapering off Cymbalta, higher dose of Wellbutrin, 150 mg 3 tablets every day.  She presented recurrent episode of transient dizziness, since April 2015, the first episode happened while she rolled over at night in her bed, she felt her body was falling, few second, she was able to fall into sleep afterwards. Since the initial episode, she has multiple recurrent similar episode, transient, dizziness sensation, it can inducated by turning quickly, or shifting her focus quickly.  She also reported episode of catching her right foot on the floor.  She had a history of migraine for many years, getting worse over the past one year, about 1-2 times each week, right retro-orbital area severe pounding headaches, with associated light noise sensitivity, lasting for 2-3 hours, usually relieved by lying down in dark quiet room, and sleep, she has tried Imitrex, not helpful, does not like the side effect, Triximet, which has been helpful, Frova, is helpful   UPDATE July 8th 2015:  She continued to have transient dizziness, difficulty focusing, made worse by taking Topamax, she has not had a headache for a while  We have reviewed MRI of the brain together,  Multiple (>20) small, round, periventricular and subcortical and juxtacortical T2 hyperintensities. These findings are non-specific and considerations include autoimmune, inflammatory, post-infectious, microvascular ischemic or migraine associated etiologies.   Laboratory evaluation in December  2014,  hemoglobin 14.7, was normal vitamin B6, B12 327, mildly low vitamin D 27, normal vitamin D, prealbumin, folic acid, ferritin, normal PTH, calcium level,   REVIEW OF SYSTEMS: Full 14 system review of systems performed and notable only for insomnia allergies, runny nose  ALLERGIES: No Known Allergies  HOME MEDICATIONS: Current Outpatient Prescriptions on File Prior to Visit  Medication Sig Dispense Refill  . ALPRAZolam (XANAX) 0.5 MG tablet Take one-half to one tablet as needed for sleep or anxiety.  45 tablet  3  . buPROPion (WELLBUTRIN XL) 150 MG 24 hr tablet Take 3 tablets (432m) daily.  270 tablet  1  . estrogen-methylTESTOSTERone (ESTRATEST) 1.25-2.5 MG per tablet Take 1 tablet by mouth daily.  90 tablet  4  . fluticasone (FLONASE) 50 MCG/ACT nasal spray Place 2 sprays into the nose as needed.      . Multiple Vitamins-Minerals (MULTIVITAMIN PO) Take 1 tablet by mouth daily.      .Marland Kitchenspironolactone (ALDACTONE) 100 MG tablet Take 1 tablet (100 mg total) by mouth daily.  90 tablet  3  . zolpidem (AMBIEN) 10 MG tablet Take 1 tablet (10 mg total) by mouth at bedtime as needed for sleep.  90 tablet  0    PAST MEDICAL HISTORY: Past Medical History  Diagnosis Date  . Morbid obesity   . Rapid heartbeat   . 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 Varicose veins   . Menorrhagia   . Migraines     migraine headaches  . Eye pressure     elevated  . Infertility, female     clomid and IUI x 3  . Vertigo  PAST SURGICAL HISTORY: Past Surgical History  Procedure Laterality Date  . Diagnostic laparoscopy      for infertility that was negative  . Laparoscopic cholecystectomy  1994  . Tonsillectomy      x 3  . Nasal sinus surgery    . Laparoscopic radical total hysterectomy w/ node biopsy  09/2009    with BSO  . 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 Hernia repair  3/12  . Cesarean section      x2    FAMILY HISTORY: Family History  Problem Relation  Age of Onset  . Coronary artery disease Mother   . Heart disease Mother   . Colon cancer Father   . Coronary artery disease Father   . Diabetes Father   . Cancer Father     lung  . Heart disease Father   . Lung cancer Paternal Grandmother   . Prostate cancer Paternal Grandfather   . Cancer Paternal Grandfather     prostate  . Cancer Maternal Uncle     throat  . Cancer Maternal Grandmother     lung    SOCIAL HISTORY:  History   Social History  . Marital Status: Married    Spouse Name: Merry Proud    Number of Children: 2  . Years of Education: college   Occupational History    [employer]   Social History Main Topics  . Smoking status: Never Smoker   . Smokeless tobacco: Never Used  . Alcohol Use: No  . Drug Use: No  . Sexual Activity: Yes   Other Topics Concern  . Not on file   Social History Narrative   Patient lives at home with her husband Merry Proud). Patient has two children and works full time for [employer].   Caffeine patient drinks a very mild amount not daily.     PHYSICAL EXAM   Filed Vitals:   04/25/14 1546  BP: 136/87  Pulse: 73  Height: 5' 7"  (1.702 m)  Weight: 240 lb (108.863 kg)    Not recorded   Bp sitting 134/90, standing 120/84, lying down 107/67  Body mass index is 37.58 kg/(m^2).   Generalized: In no acute distress  Neck: Supple, no carotid bruits   Cardiac: Regular rate rhythm  Pulmonary: Clear to auscultation bilaterally  Musculoskeletal: No deformity  Neurological examination  Mentation: Alert oriented to time, place, history taking, and causual conversation  Cranial nerve II-XII: Pupils were equal round reactive to light. Extraocular movements were full.  Visual field were full on confrontational test. Bilateral fundi were sharp.  Facial sensation and strength were normal. Hearing was intact to finger rubbing bilaterally. Uvula tongue midline.  Head turning and shoulder shrug and were normal  and symmetric.Tongue protrusion into cheek strength was normal.  Motor: Normal tone, bulk and strength.  Sensory: Intact to fine touch, pinprick, preserved vibratory sensation, and proprioception at toes.  Coordination: Normal finger to nose, heel-to-shin bilaterally there was no truncal ataxia  Gait: Rising up from seated position without assistance, normal stance, without trunk ataxia, moderate stride, good arm swing, smooth turning, able to perform tiptoe, and heel walking without difficulty.   Romberg signs: Negative  Deep tendon reflexes: Brachioradialis 3/3, biceps3/3, triceps 3/3, patellar3/3, Achilles 2/2, plantar responses were flexor bilaterally.   DIAGNOSTIC DATA (LABS, IMAGING, TESTING) - I reviewed patient records, labs, notes, testing and imaging myself where available.  Lab Results  Component Value Date   WBC 10.8*  09/25/2009   HGB 14.7 10/18/2013   HCT 42.4 10/18/2013   MCV 96.8 09/25/2009   PLT 193 09/25/2009      Component Value Date/Time   NA 137 10/18/2013 1006   K 4.2 10/18/2013 1006   CL 99 10/18/2013 1006   CO2 28 10/18/2013 1006   GLUCOSE 92 10/18/2013 1006   BUN 13 10/18/2013 1006   CREATININE 0.88 10/18/2013 1006   CREATININE 0.93 09/20/2009 1200   CALCIUM 9.3 01/24/2014 0902   PROT 6.8 10/18/2013 1006   ALBUMIN 4.3 10/18/2013 1006   AST 34 10/18/2013 1006   ALT 24 10/18/2013 1006   ALKPHOS 66 10/18/2013 1006   BILITOT 0.8 10/18/2013 1006   GFRNONAA >60 09/20/2009 1200   GFRAA  Value: >60        The eGFR has been calculated using the MDRD equation. This calculation has not been validated in all clinical situations. eGFR's persistently <60 mL/min signify possible Chronic Kidney Disease. 09/20/2009 1200   Lab Results  Component Value Date   CHOL 191 10/18/2013   HDL 71 10/18/2013   LDLCALC 104* 10/18/2013   TRIG 81 10/18/2013   CHOLHDL 2.7 10/18/2013   Lab Results  Component Value Date   HGBA1C 5.3 10/18/2013   Lab Results  Component Value  Date   VITAMINB12 325 10/18/2013   ASSESSMENT AND PLAN  Judith Holland is a 48 y.o. female complains of  frequent recurrent episode of transient dizziness, she has past medical history of obesity, status post gastric bypass surgery, migraine headaches,   1. migraine headaches,  Is better now, Maxalt as needed, Riboflavin, magnesium oxide is also suggested. 2, her recurrent episodes of transient dizziness could due to medicine side effect, lack of sleep, mood disorder, less likely related to her MRI findings.  3. differentiation diagnosis of her abnormal MRI brain including small vessel disease, migraine-related findings, inflammatory process, remote possibility of multiple sclerosis, laboratory evaluation, lumbar puncture, 3. Return to clinic in one month   Marcial Pacas, M.D. Ph.D.  [facility] [address] [place], [place] [address] [phone]

## 2014-04-26 ENCOUNTER — Telehealth: Payer: Self-pay | Admitting: Neurology

## 2014-04-26 NOTE — Telephone Encounter (Signed)
Patient calling to request that her bloodwork orders be changed from [facility] to [facility] because she would like to have it done at her work. Patient was here yesterday and was unable to get labs done because it was past 4:15. Please return call to patient and advise.

## 2014-04-30 ENCOUNTER — Encounter: Payer: Self-pay | Admitting: *Deleted

## 2014-04-30 NOTE — Progress Notes (Signed)
This encounter was created in error - please disregard.

## 2014-04-30 NOTE — Telephone Encounter (Signed)
Patient still waiting on getting her labwork orders changed so that she can have it done at her work, please return call to patient and advise.

## 2014-05-01 NOTE — Telephone Encounter (Signed)
Lab ordered, dana, please fax.

## 2014-05-01 NOTE — Telephone Encounter (Signed)
DONE

## 2014-05-02 ENCOUNTER — Other Ambulatory Visit: Payer: Self-pay | Admitting: Neurology

## 2014-05-03 LAB — LYME ABY, WSTRN BLT IGG & IGM W/BANDS
B burgdorferi IgG Abs (IB): NEGATIVE
B burgdorferi IgM Abs (IB): NEGATIVE
LYME DISEASE 41 KD IGG: NONREACTIVE
LYME DISEASE 45 KD IGG: NONREACTIVE
LYME DISEASE 58 KD IGG: NONREACTIVE
LYME DISEASE 66 KD IGG: NONREACTIVE
LYME DISEASE 93 KD IGG: NONREACTIVE
Lyme Disease 18 kD IgG: NONREACTIVE
Lyme Disease 23 kD IgG: NONREACTIVE
Lyme Disease 23 kD IgM: NONREACTIVE
Lyme Disease 28 kD IgG: NONREACTIVE
Lyme Disease 30 kD IgG: NONREACTIVE
Lyme Disease 39 kD IgG: NONREACTIVE
Lyme Disease 39 kD IgM: NONREACTIVE
Lyme Disease 41 kD IgM: NONREACTIVE

## 2014-05-03 LAB — B. BURGDORFI ANTIBODIES: B burgdorferi Ab IgG+IgM: 0.37 {ISR}

## 2014-05-03 LAB — ANA: ANA: NEGATIVE

## 2014-05-04 ENCOUNTER — Telehealth: Payer: Self-pay | Admitting: Neurology

## 2014-05-04 LAB — SPEP & IFE WITH QIG
Albumin ELP: 61.3 % (ref 55.8–66.1)
Alpha-1-Globulin: 3.8 % (ref 2.9–4.9)
Alpha-2-Globulin: 9.6 % (ref 7.1–11.8)
Beta 2: 4.8 % (ref 3.2–6.5)
Beta Globulin: 7.9 % — ABNORMAL HIGH (ref 4.7–7.2)
Gamma Globulin: 12.6 % (ref 11.1–18.8)
IGG (IMMUNOGLOBIN G), SERUM: 846 mg/dL (ref 690–1700)
IgA: 218 mg/dL (ref 69–380)
IgM, Serum: 112 mg/dL (ref 52–322)
TOTAL PROTEIN, SERUM ELECTROPHOR: 6.7 g/dL (ref 6.0–8.3)

## 2014-05-04 NOTE — Telephone Encounter (Signed)
Call her for normal IFPE, negative ANA, lyme titer. LP is pending.

## 2014-05-09 ENCOUNTER — Telehealth: Payer: Self-pay | Admitting: Neurology

## 2014-05-09 ENCOUNTER — Telehealth: Payer: Self-pay | Admitting: *Deleted

## 2014-05-09 NOTE — Telephone Encounter (Signed)
Patient returning our call to state that she just needed to reschedule her LP, she just needs to time it better with her work schedule.

## 2014-05-09 NOTE — Telephone Encounter (Signed)
Called patient to find out why LP was cancelled? Left message asking patient to call office back and let us know if she plans to reschedule.

## 2014-05-10 ENCOUNTER — Inpatient Hospital Stay: Admission: RE | Admit: 2014-05-10 | Payer: 59 | Source: Ambulatory Visit

## 2014-05-17 ENCOUNTER — Ambulatory Visit: Payer: 59 | Admitting: Neurology

## 2014-05-25 ENCOUNTER — Encounter: Payer: Self-pay | Admitting: Neurology

## 2014-06-29 ENCOUNTER — Telehealth: Payer: Self-pay | Admitting: Emergency Medicine

## 2014-06-29 MED ORDER — ALPRAZOLAM 0.5 MG PO TABS: ORAL_TABLET | ORAL | Status: DC

## 2014-06-29 MED ORDER — ESTRADIOL 0.1 MG/24HR TD PTTW: 1.0000 | MEDICATED_PATCH | TRANSDERMAL | Status: DC

## 2014-06-29 MED ORDER — ZOLPIDEM TARTRATE ER 6.25 MG PO TBCR: 6.2500 mg | EXTENDED_RELEASE_TABLET | Freq: Every evening | ORAL | Status: DC | PRN

## 2014-06-29 NOTE — Telephone Encounter (Signed)
Patient requesting refill/change of medications.  Recent evaluation for dizziness/vertigo. Felt dizziness may be r/t to Estratest. Patient decided to dc Estratest about 3 weeks ago and has had some slight improvement of dizziness symptoms. Using prior rx of Vivelle 0.1 mg patches. Needs refill of Vivelle if Dr. Sabra Heck agreeable.  Patient reports prior rx of Ambien 12.5 mg CR and felt it may be too strong. Patient is unable to fall asleep after waking up in the middle of the night to use the restroom. Wondering if she can try Ambien 6.25 CR to see if that works for her.  Also, patient requests refill of Xanax 0.5 mg.  Uses [facility].

## 2014-06-29 NOTE — Telephone Encounter (Signed)
Vivelle dot sent electronically to pharmacy.  Xanax and ambien cr 6.25mg  printed for signature and faxing.

## 2014-07-26 ENCOUNTER — Telehealth: Payer: Self-pay | Admitting: Emergency Medicine

## 2014-07-26 MED ORDER — ZOLPIDEM TARTRATE ER 12.5 MG PO TBCR: 12.5000 mg | EXTENDED_RELEASE_TABLET | Freq: Every evening | ORAL | Status: DC | PRN

## 2014-07-26 NOTE — Telephone Encounter (Signed)
Dr. Sabra Heck,  Spoke with patient and she is having difficulty with sleep with Ambien CR 6.25. Unable to sleep through the night and has ongoing wakefulness at night. Requesting to return back to Ambien 12.5 CR as she was on prior as that has worked the best.   Requests refill to [facility].

## 2014-07-26 NOTE — Telephone Encounter (Signed)
Rx printed for 12.5mg .  #90/1RF.

## 2014-08-20 ENCOUNTER — Encounter: Payer: Self-pay | Admitting: Neurology

## 2014-09-07 ENCOUNTER — Ambulatory Visit: Payer: 59 | Admitting: Neurology

## 2014-09-17 ENCOUNTER — Ambulatory Visit: Payer: 59 | Admitting: Neurology

## 2014-09-27 ENCOUNTER — Other Ambulatory Visit: Payer: Self-pay | Admitting: Obstetrics & Gynecology

## 2014-09-27 NOTE — Telephone Encounter (Signed)
Medication refill request: Wellbutrin XL 150 mg  Last AEX:  01/23/14 with Dr. Sabra Heck  Next AEX: 02/08/15 with Dr. Sabra Heck Last MMG (if hormonal medication request): N/A Refill authorized: #[id number] rfs ?, please advise

## 2014-10-05 ENCOUNTER — Ambulatory Visit: Payer: 59 | Admitting: Obstetrics & Gynecology

## 2014-10-10 ENCOUNTER — Ambulatory Visit: Payer: 59 | Admitting: Obstetrics & Gynecology

## 2014-10-17 ENCOUNTER — Encounter: Payer: Self-pay | Admitting: Obstetrics & Gynecology

## 2014-10-17 ENCOUNTER — Other Ambulatory Visit: Payer: Self-pay | Admitting: Obstetrics & Gynecology

## 2014-10-17 ENCOUNTER — Ambulatory Visit (INDEPENDENT_AMBULATORY_CARE_PROVIDER_SITE_OTHER): Payer: 59 | Admitting: Obstetrics & Gynecology

## 2014-10-17 VITALS — BP 118/82 | HR 60 | Resp 20 | Wt 231.0 lb

## 2014-10-17 DIAGNOSIS — D72819 Decreased white blood cell count, unspecified: Secondary | ICD-10-CM

## 2014-10-17 DIAGNOSIS — C541 Malignant neoplasm of endometrium: Secondary | ICD-10-CM

## 2014-10-17 DIAGNOSIS — Z9884 Bariatric surgery status: Secondary | ICD-10-CM

## 2014-10-17 LAB — CBC
HEMATOCRIT: 42.1 % (ref 36.0–46.0)
HEMOGLOBIN: 14.8 g/dL (ref 12.0–15.0)
MCH: 31.6 pg (ref 26.0–34.0)
MCHC: 35.2 g/dL (ref 30.0–36.0)
MCV: 90 fL (ref 78.0–100.0)
MPV: 10.5 fL (ref 8.6–12.4)
Platelets: 224 10*3/uL (ref 150–400)
RBC: 4.68 MIL/uL (ref 3.87–5.11)
RDW: 13.6 % (ref 11.5–15.5)
WBC: 3.5 10*3/uL — AB (ref 4.0–10.5)

## 2014-10-17 LAB — HEMOGLOBIN A1C
Hgb A1c MFr Bld: 5.3 % (ref <5.7)
MEAN PLASMA GLUCOSE: 105 mg/dL (ref <117)

## 2014-10-17 LAB — LIPID PANEL
CHOL/HDL RATIO: 2.9 ratio
CHOLESTEROL: 161 mg/dL (ref 0–200)
HDL: 56 mg/dL (ref >39)
LDL Cholesterol: 82 mg/dL (ref 0–99)
Triglycerides: 117 mg/dL (ref <150)
VLDL: 23 mg/dL (ref 0–40)

## 2014-10-17 LAB — COMPREHENSIVE METABOLIC PANEL
ALT: 15 U/L (ref 0–35)
AST: 22 U/L (ref 0–37)
Albumin: 4.3 g/dL (ref 3.5–5.2)
Alkaline Phosphatase: 69 U/L (ref 39–117)
BUN: 13 mg/dL (ref 6–23)
CALCIUM: 9.3 mg/dL (ref 8.4–10.5)
CHLORIDE: 106 meq/L (ref 96–112)
CO2: 25 meq/L (ref 19–32)
CREATININE: 0.95 mg/dL (ref 0.50–1.10)
Glucose, Bld: 87 mg/dL (ref 70–99)
Potassium: 4.2 mEq/L (ref 3.5–5.3)
Sodium: 139 mEq/L (ref 135–145)
Total Bilirubin: 0.6 mg/dL (ref 0.2–1.2)
Total Protein: 6.8 g/dL (ref 6.0–8.3)

## 2014-10-17 MED ORDER — DULOXETINE HCL 60 MG PO CPEP: 60.0000 mg | ORAL_CAPSULE | Freq: Every day | ORAL | Status: DC

## 2014-10-17 NOTE — Progress Notes (Signed)
Subjective:     Patient ID: Judith Holland, female   DOB: 1966/09/08, 48 y.o.   MRN: [id number]  HPI Very nice 48 yo G2p2 MWF here for final pap after having endometrial cancer 5 years ago.  Pt denies vaginal bleeding or pain.  Does want to discuss anti-depressants.  She was on Cymbalta but stopped due to vertigo and headaches.  Pt has seen neurologist and is on Topamax 50mg , 2 tabs, BID.  Headaches are much better.  Pt "figured out" that dizziness was due to [place] and is now back on vivelle dot.  Feels the Cymbalta and Wellbutrin together worked better for her.  She would like to change back to this.  Review of Systems  All other systems reviewed and are negative.      Objective:   Physical Exam  Constitutional: She is oriented to person, place, and time. She appears well-developed and well-nourished.  Abdominal: Soft. Bowel sounds are normal.  Genitourinary: Vagina normal. There is no rash, tenderness or lesion on the right labia. There is no rash, tenderness or lesion on the left labia.  Cervix and uterus are absent.  Pap obtained.  Possible enlarged bladder noted.  Under sterile conditions PVR obtained.  ~35cc.  Repeat BME, fullness not noted.  Rectal exam negative.  Lymphadenopathy:       Right: No inguinal adenopathy present.       Left: No inguinal adenopathy present.  Neurological: She is alert and oriented to person, place, and time.  Skin: Skin is warm and dry.  Psychiatric: She has a normal mood and affect.       Assessment:     H/O endometrial cancer Depression     Plan:     Pap obtained Pt will decrease Wellbutrin XL to 300mg  daily and then restart Cymbalta 60mg  daily.  Rx to pharmacy.

## 2014-10-17 NOTE — Addendum Note (Signed)
Addended by: Charmayne Sheer on: 10/17/2014 05:06 PM   Modules accepted: Orders

## 2014-10-17 NOTE — Progress Notes (Signed)
Orders placed for bariatric labs.  Pt to have drawn today.

## 2014-10-18 LAB — VITAMIN B12: Vitamin B-12: 364 pg/mL (ref 211–911)

## 2014-10-18 LAB — PREALBUMIN: Prealbumin: 24.9 mg/dL (ref 17.0–34.0)

## 2014-10-18 LAB — FERRITIN: Ferritin: 10 ng/mL (ref 10–291)

## 2014-10-18 LAB — FOLATE: Folate: 7.2 ng/mL

## 2014-10-18 LAB — PARATHYROID HORMONE, INTACT (NO CA): PTH: 54 pg/mL (ref 14–64)

## 2014-10-21 LAB — VITAMIN E
GAMMA-TOCOPHEROL (VIT E): 1 mg/L (ref <=4.3)
VITAMIN E (ALPHA TOCOPHEROL): 9.5 mg/L (ref 5.7–19.9)

## 2014-10-21 LAB — VITAMIN B6: VITAMIN B6: 6.2 ng/mL (ref 2.1–21.7)

## 2014-10-22 NOTE — Addendum Note (Signed)
Addended by: Megan Salon on: 10/22/2014 07:27 AM   Modules accepted: Orders

## 2014-10-23 LAB — IPS PAP TEST WITH REFLEX TO HPV

## 2014-10-23 MED ORDER — FLUCONAZOLE 150 MG PO TABS: 150.0000 mg | ORAL_TABLET | Freq: Once | ORAL | Status: DC

## 2014-10-23 NOTE — Addendum Note (Signed)
Addended by: Megan Salon on: 10/23/2014 10:42 AM   Modules accepted: Orders

## 2015-01-03 DIAGNOSIS — Z01419 Encounter for gynecological examination (general) (routine) without abnormal findings: Secondary | ICD-10-CM

## 2015-01-14 ENCOUNTER — Other Ambulatory Visit: Payer: Self-pay | Admitting: Obstetrics & Gynecology

## 2015-01-14 MED ORDER — ZOLPIDEM TARTRATE ER 12.5 MG PO TBCR: 12.5000 mg | EXTENDED_RELEASE_TABLET | Freq: Every evening | ORAL | Status: DC | PRN

## 2015-01-14 MED ORDER — ALPRAZOLAM 0.5 MG PO TABS: ORAL_TABLET | ORAL | Status: DC

## 2015-01-14 NOTE — Telephone Encounter (Signed)
Medication refill request: Aldactone 100 mg Last AEX:  01/23/14  Next AEX: 02/08/15  Last MMG (if hormonal medication request): 08/21/14 [id number]:Neg Refill authorized: 10/17/13 #90/3R .Today please advise.

## 2015-01-15 ENCOUNTER — Telehealth: Payer: Self-pay | Admitting: *Deleted

## 2015-01-15 NOTE — Telephone Encounter (Signed)
Fax from [facility] wanting to clarify prescription for Xanax 0.5 mg tablet.  Dr. Sabra Heck rx was written as: Take one-half to one tablet as needed for sleep or anxiety.   Pharmacy is wanting to know how many times daily, please advise.

## 2015-01-15 NOTE — Telephone Encounter (Signed)
1/2 to 1 tab daily as needed prn sleep or anxiety.  Thank you.

## 2015-01-17 NOTE — Telephone Encounter (Signed)
[facility] and s/w pharmacist and clarified rx as prescribed 1/2 to 1 tab daily prn sleep or anxiety.

## 2015-02-08 ENCOUNTER — Ambulatory Visit: Payer: 59 | Admitting: Obstetrics & Gynecology

## 2015-04-19 ENCOUNTER — Other Ambulatory Visit: Payer: Self-pay | Admitting: Obstetrics & Gynecology

## 2015-04-19 ENCOUNTER — Other Ambulatory Visit: Payer: Self-pay | Admitting: Neurology

## 2015-04-19 MED ORDER — ALPRAZOLAM 0.5 MG PO TABS: ORAL_TABLET | ORAL | Status: DC

## 2015-04-19 NOTE — Telephone Encounter (Signed)
Medication refill request: Wellbutrin  Last AEX:  01/23/14  Next AEX: ? Last MMG (if hormonal medication request): 08/21/14  Refill authorized: please advise

## 2015-05-06 ENCOUNTER — Other Ambulatory Visit: Payer: Self-pay | Admitting: Obstetrics and Gynecology

## 2015-05-06 ENCOUNTER — Telehealth: Payer: Self-pay | Admitting: Emergency Medicine

## 2015-05-06 DIAGNOSIS — G43809 Other migraine, not intractable, without status migrainosus: Secondary | ICD-10-CM

## 2015-05-06 MED ORDER — PROMETHAZINE HCL 12.5 MG PO TABS: ORAL_TABLET | ORAL | Status: DC

## 2015-05-06 NOTE — Telephone Encounter (Signed)
Patient requested rx change location.  Called [facility] and spoke with [place]. Advised to DC order for Phenergan 12.5 mg as it will be transferred to [facility] [address].  Rx sent to [facility] [address] per patient request.   Routing to provider for final review. Patient agreeable to disposition. Will close encounter.

## 2015-05-16 ENCOUNTER — Telehealth: Payer: Self-pay | Admitting: Neurology

## 2015-05-16 MED ORDER — TOPIRAMATE 50 MG PO TABS: ORAL_TABLET | ORAL | Status: DC

## 2015-05-16 NOTE — Telephone Encounter (Signed)
Pt needs refill on topiramate (TOPAMAX) 50 MG tablet, yearly appt has been scheduled . Needs medication until then. Thank you

## 2015-05-16 NOTE — Telephone Encounter (Signed)
Patient was last seen by Dr Krista Blue.  This med was prescribed at [place] on 04/16/2014.  Refill sent to last until appt.

## 2015-05-28 ENCOUNTER — Other Ambulatory Visit: Payer: Self-pay | Admitting: Obstetrics & Gynecology

## 2015-05-28 MED ORDER — TOPIRAMATE 50 MG PO TABS: ORAL_TABLET | ORAL | Status: DC

## 2015-05-29 ENCOUNTER — Ambulatory Visit: Payer: 59 | Admitting: Neurology

## 2015-07-10 ENCOUNTER — Ambulatory Visit: Payer: 59 | Admitting: Neurology

## 2015-07-24 ENCOUNTER — Other Ambulatory Visit: Payer: Self-pay | Admitting: Obstetrics & Gynecology

## 2015-07-24 NOTE — Telephone Encounter (Signed)
Medication refill request: Vivelle dot  Last AEX:  01/23/14 SM Next AEX: ? Last MMG (if hormonal medication request): 08/21/14 BIRADS1:neg Refill authorized: 06/29/14 #24patch/ 4R. Today please advise.

## 2015-07-25 NOTE — Telephone Encounter (Signed)
Pt notified refill sent to pharmacy.  Closing encounter.

## 2015-08-29 ENCOUNTER — Other Ambulatory Visit: Payer: Self-pay | Admitting: Emergency Medicine

## 2015-08-29 MED ORDER — ESTRADIOL 1 MG PO TABS: 1.0000 mg | ORAL_TABLET | Freq: Every day | ORAL | Status: DC

## 2015-08-29 NOTE — Progress Notes (Signed)
The patient forgot to change her estrogen patch, heading out of town after work, having hot flashes. Estradiol 1 mg called in to the pharmacy. She is very worried about recent MRI imaging. Prior imaging reviewed with her.  She has spoken to her primary, will plan on seeing neurology for a second opinion.

## 2015-09-11 ENCOUNTER — Ambulatory Visit (INDEPENDENT_AMBULATORY_CARE_PROVIDER_SITE_OTHER): Payer: 59 | Admitting: Obstetrics & Gynecology

## 2015-09-11 ENCOUNTER — Encounter: Payer: Self-pay | Admitting: Obstetrics & Gynecology

## 2015-09-11 ENCOUNTER — Telehealth: Payer: Self-pay | Admitting: Emergency Medicine

## 2015-09-11 VITALS — BP 110/68 | HR 64 | Resp 18 | Ht 67.0 in | Wt 221.2 lb

## 2015-09-11 DIAGNOSIS — Z01419 Encounter for gynecological examination (general) (routine) without abnormal findings: Secondary | ICD-10-CM

## 2015-09-11 DIAGNOSIS — Z9889 Other specified postprocedural states: Secondary | ICD-10-CM | POA: Diagnosis not present

## 2015-09-11 DIAGNOSIS — Z0000 Encounter for general adult medical examination without abnormal findings: Secondary | ICD-10-CM | POA: Diagnosis not present

## 2015-09-11 DIAGNOSIS — Z124 Encounter for screening for malignant neoplasm of cervix: Secondary | ICD-10-CM | POA: Diagnosis not present

## 2015-09-11 DIAGNOSIS — N393 Stress incontinence (female) (male): Secondary | ICD-10-CM

## 2015-09-11 DIAGNOSIS — Z9884 Bariatric surgery status: Secondary | ICD-10-CM

## 2015-09-11 LAB — POCT URINALYSIS DIPSTICK
Bilirubin, UA: NEGATIVE
Glucose, UA: NEGATIVE
Ketones, UA: NEGATIVE
Leukocytes, UA: NEGATIVE
NITRITE UA: NEGATIVE
PH UA: 5
Protein, UA: NEGATIVE
RBC UA: NEGATIVE
UROBILINOGEN UA: NEGATIVE

## 2015-09-11 LAB — LIPID PANEL
CHOLESTEROL: 174 mg/dL (ref 125–200)
HDL: 76 mg/dL (ref >=46)
LDL CALC: 85 mg/dL (ref <130)
Total CHOL/HDL Ratio: 2.3 Ratio (ref <=5.0)
Triglycerides: 67 mg/dL (ref <150)
VLDL: 13 mg/dL (ref <30)

## 2015-09-11 LAB — CBC
HCT: 39.4 % (ref 36.0–46.0)
Hemoglobin: 13.2 g/dL (ref 12.0–15.0)
MCH: 31.1 pg (ref 26.0–34.0)
MCHC: 33.5 g/dL (ref 30.0–36.0)
MCV: 92.7 fL (ref 78.0–100.0)
MPV: 10.9 fL (ref 8.6–12.4)
Platelets: 210 10*3/uL (ref 150–400)
RBC: 4.25 MIL/uL (ref 3.87–5.11)
RDW: 13.1 % (ref 11.5–15.5)
WBC: 3.1 10*3/uL — AB (ref 4.0–10.5)

## 2015-09-11 LAB — FERRITIN: FERRITIN: 7 ng/mL — AB (ref 10–291)

## 2015-09-11 LAB — VITAMIN B12: VITAMIN B 12: 480 pg/mL (ref 211–911)

## 2015-09-11 LAB — HEMOGLOBIN, FINGERSTICK: HEMOGLOBIN, FINGERSTICK: 13.2 g/dL (ref 12.0–16.0)

## 2015-09-11 LAB — TSH: TSH: 1.648 u[IU]/mL (ref 0.350–4.500)

## 2015-09-11 LAB — FOLATE: FOLATE: 9.4 ng/mL

## 2015-09-11 MED ORDER — DESVENLAFAXINE SUCCINATE ER 50 MG PO TB24: 50.0000 mg | ORAL_TABLET | Freq: Every day | ORAL | Status: DC

## 2015-09-11 MED ORDER — SPIRONOLACTONE 100 MG PO TABS: 100.0000 mg | ORAL_TABLET | Freq: Two times a day (BID) | ORAL | Status: DC

## 2015-09-11 MED ORDER — BUPROPION HCL ER (XL) 300 MG PO TB24: 300.0000 mg | ORAL_TABLET | Freq: Every day | ORAL | Status: DC

## 2015-09-11 MED ORDER — ESTRADIOL 0.1 MG/24HR TD PTTW: MEDICATED_PATCH | TRANSDERMAL | Status: DC

## 2015-09-11 NOTE — Telephone Encounter (Signed)
Calling [employer] [place] to obtain information regarding pre-authorization for Intone. Number [id number]

## 2015-09-11 NOTE — Progress Notes (Signed)
Patient ID: Judith Holland, female   DOB: January 05, 1966, 49 y.o.   MRN: [id number]   49 y.o. G3P2 MarriedCaucasianF here for annual exam.  Denies vaginal bleeding.    Pt reports she wants to talk about SUI.  She leaks when she coughs and laughs.  Sometimes she has so much urgency as well that she can't get to the bathroom until she leaks.  She reports vaginal pressure at times as well.  She's had gastric bypass and feels like she could really improve her quality of life with improved rectal tone.  She's been doing focused Kegel exercises over the last year and she hasn't noticed any improvement.  She has to really focus on her Kegel exercises to do them "adequately".  Would like to consider other options.  Not interested in surgery if at all possible.    Pt feels like her hair is thinning.  Pt reports she's had more headaches this year.  She's had some elevated BS's at home.  HbA1C with Dr. Melford Aase was 4.8.      No LMP recorded. Patient has had a hysterectomy.          Sexually active: Yes.    The current method of family planning is status post hysterectomy.    Exercising: No.   Smoker:  no  Health Maintenance: Pap:  10-18-14 Neg History of abnormal Pap:  no MMG:  08-21-14 3D Density Cat.B/Neg/BiRads1;[facility] Colonoscopy:  2012 BMD:   2011 TDaP:  06-17-10 Screening Labs: today, Hb today: 13.2,Urine today: Neg   reports that she has never smoked. She has never used smokeless tobacco. She reports that she does not drink alcohol or use illicit drugs.  Past Medical History  Diagnosis Date  . Morbid obesity ([place])   . Rapid heartbeat   . Endometrioid adenocarcinoma     Stage IA grade 1  . Varicose veins   . Menorrhagia   . Migraines     migraine headaches  . Eye pressure     elevated  . Infertility, female     clomid and IUI x 3  . Vertigo     Past Surgical History  Procedure Laterality Date  . Diagnostic laparoscopy      for infertility that was negative  . Laparoscopic  cholecystectomy  1994  . Tonsillectomy      x 3  . Nasal sinus surgery    . Laparoscopic radical total hysterectomy w/ node biopsy  09/2009    with BSO  . 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 Hernia repair  3/12  . Cesarean section      x2    Current Outpatient Prescriptions  Medication Sig Dispense Refill  . diclofenac (VOLTAREN) 75 MG EC tablet Take 75 mg by mouth as needed.    Marland Kitchen estradiol (VIVELLE-DOT) 0.1 MG/24HR patch PLACE 1 PATCH 0NTO THE SKIN 2 TIMES A WEEK.    . Eszopiclone (ESZOPICLONE) 3 MG TABS Take 3 mg by mouth at bedtime.    . topiramate (TOPAMAX) 50 MG tablet TAKE 2 TABLETS BY MOUTH 2 TIMES A DAY    . ALPRAZolam (XANAX) 0.5 MG tablet Take one-half to one tablet as needed for sleep or anxiety. 45 tablet 3  . buPROPion (WELLBUTRIN XL) 150 MG 24 hr tablet TAKE 3 TABLETS BY MOUTH DAILY 270 tablet 3  . estradiol (ESTRACE) 1 MG tablet Take 1 tablet (1 mg total) by mouth daily. (Patient taking differently: Take 1 mg by mouth as needed. ) 30 tablet  0  . metroNIDAZOLE (METROGEL) 0.75 % gel Apply 1 application topically 2 (two) times daily.  3  . Multiple Vitamins-Minerals (MULTIVITAMIN PO) Take 1 tablet by mouth daily.    . promethazine (PHENERGAN) 12.5 MG tablet 1-2 tablets po q4-6 hours prn nausea 30 tablet 0  . spironolactone (ALDACTONE) 100 MG tablet TAKE 1 TABLET BY MOUTH ONCE DAILY 90 tablet 2   No current facility-administered medications for this visit.    Family History  Problem Relation Age of Onset  . Coronary artery disease Mother   . Heart disease Mother   . Colon cancer Father   . Coronary artery disease Father   . Diabetes Father   . Cancer Father     lung  . Heart disease Father   . Lung cancer Paternal Grandmother   . Prostate cancer Paternal Grandfather   . Cancer Paternal Grandfather     prostate  . Cancer Maternal Uncle     throat  . Cancer Maternal Grandmother     lung    ROS:  Pertinent items are noted in HPI.  Otherwise, a comprehensive ROS  was negative.  Exam:   BP 110/68 mmHg  Pulse 64  Resp 18  Ht 5\' 7"  (1.702 m)  Wt 221 lb 3.2 oz (100.336 kg)  BMI 34.64 kg/m2  Weight change: -7#   Height: 5\' 7"  (170.2 cm)  Ht Readings from Last 3 Encounters:  09/11/15 5\' 7"  (1.702 m)  04/25/14 5\' 7"  (1.702 m)  04/16/14 5\' 7"  (1.702 m)    General appearance: alert, cooperative and appears stated age Head: Normocephalic, without obvious abnormality, atraumatic Neck: no adenopathy, supple, symmetrical, trachea midline and thyroid normal to inspection and palpation Lungs: clear to auscultation bilaterally Breasts: normal appearance, no masses or tenderness Heart: regular rate and rhythm Abdomen: soft, non-tender; bowel sounds normal; no masses,  no organomegaly Extremities: extremities normal, atraumatic, no cyanosis or edema Skin: Skin color, texture, turgor normal. No rashes or lesions Lymph nodes: Cervical, supraclavicular, and axillary nodes normal. No abnormal inguinal nodes palpated Neurologic: Grossly normal   Pelvic: External genitalia:  no lesions              Urethra:  normal appearing urethra with no masses, tenderness or lesions              Bartholins and Skenes: normal                 Vagina: normal appearing vagina with normal color and discharge, no lesions              Cervix: absent              Pap taken: Yes.   Bimanual Exam:  Uterus:  uterus absent              Adnexa: normal adnexa and no mass, fullness, tenderness               Rectovaginal: Confirms               Anus:  normal sphincter tone, no lesions  Pt can perform a Kegel but the pelvic musculature is weak  A:  Well Woman with normal exam H/O gastric bypass. Down ~#130 since surgery Osteopenia. Plan BMD this year.  H/O elevated PTH.  Normal last two years.   H/O stage 1A endometrial adenocarcinoma.  S/P robotic TLH/BSO/LND 12/10.  Released from Gyn/onc.  On HRT. Decreased libido H/O headaches, stress and sinus related Family hx of  colon  cancer. Colonoscopy done 2012. SUI, urinary urgency  P: Mammogram yearly.  Scheduled for 12/16. Pap smear yearly.  Obtained today. RF for Vivelle 0.1mg  patches.  Trial of estratest last year but pt did not tolerate this well.  On diclofenac for migraines Now on Prestiq 50mg  daily.  Has RX. Wellbutrin XL 300mg  daily. 90 day supply to pharmacy. Labs drawn today:  PTH, Vit B6, CMp, Bit E, Prealbumin, Folate, Ferritin, Vit B12, Lipids, CBC, TSH D/W pt trial of in-tone to improve pelvic floor and strength return annually or prn

## 2015-09-15 LAB — VITAMIN E
GAMMA-TOCOPHEROL (VIT E): 1.2 mg/L (ref <=4.3)
Vitamin E (Alpha Tocopherol): 11.2 mg/L (ref 5.7–19.9)

## 2015-09-16 LAB — IPS PAP SMEAR ONLY

## 2015-09-16 LAB — VITAMIN B6: VITAMIN B6: 28 ng/mL — AB (ref 2.1–21.7)

## 2015-09-17 ENCOUNTER — Encounter: Payer: Self-pay | Admitting: Obstetrics & Gynecology

## 2015-09-18 MED ORDER — FLUCONAZOLE 150 MG PO TABS: 150.0000 mg | ORAL_TABLET | Freq: Once | ORAL | Status: DC

## 2015-09-18 NOTE — Telephone Encounter (Signed)
Dr. Sabra Heck,  Patient was given trial of Lunesta 3 mg qhs by PCP after having memory problems with Ambien. Using Lunesta and feeling improved. She is wondering if you can fill Lunesta or does PCP need to fill?

## 2015-09-18 NOTE — Telephone Encounter (Signed)
Rx to pharmacy completed after MyChart request sent.

## 2015-09-20 MED ORDER — ESZOPICLONE 3 MG PO TABS: 3.0000 mg | ORAL_TABLET | Freq: Every day | ORAL | Status: DC

## 2015-09-24 NOTE — Telephone Encounter (Signed)
Confirmed with [facility] of [place] that this group number does not require prior authorization for Durable Medical Equipment call reference number [id number].

## 2015-11-07 NOTE — Telephone Encounter (Signed)
Jacquelyn from [facility] returning your call in regards to patient's in tone device. Best # to reach: [phone]

## 2015-11-07 NOTE — Telephone Encounter (Signed)
Fax documents and order for Intone faxed to [facility] at [phone] for ordering processing at this time.

## 2015-11-11 NOTE — Telephone Encounter (Signed)
Called and spoke with Judith Holland. She advised that patients coverage through primary insurance does not cover Intone device. Advised I would speak with patient and return call.  Will close encounter and update to Dr. Sabra Heck.

## 2015-11-22 MED FILL — TOPIRAMATE 50 MG TABLET: 50 | 90 days supply | Qty: 360 | Fill #1

## 2015-11-22 MED FILL — ESZOPICLONE 3 MG TABLET: 3 | 30 days supply | Qty: 30 | Fill #2

## 2015-11-22 MED FILL — ESTRADIOL 0.1 MG PATCH: 0.1 | 84 days supply | Qty: 24 | Fill #1

## 2015-12-11 ENCOUNTER — Ambulatory Visit: Payer: 59 | Admitting: Licensed Clinical Social Worker

## 2015-12-17 MED FILL — PRISTIQ ER 100 MG TABLET: 100 | 90 days supply | Qty: 90 | Fill #0

## 2015-12-17 MED FILL — BUPROPION HCL XL 300 MG TAB: 300 | 90 days supply | Qty: 90 | Fill #1

## 2015-12-19 MED FILL — ESZOPICLONE 3 MG TABLET: 3 | 30 days supply | Qty: 30 | Fill #3

## 2016-01-13 ENCOUNTER — Telehealth: Payer: Self-pay | Admitting: Obstetrics & Gynecology

## 2016-01-13 NOTE — Telephone Encounter (Signed)
BMD order placed to [facility].  H/O elevated PTH in past.  Last two levels have been normal.

## 2016-01-21 DIAGNOSIS — H5213 Myopia, bilateral: Secondary | ICD-10-CM | POA: Diagnosis not present

## 2016-01-21 DIAGNOSIS — H524 Presbyopia: Secondary | ICD-10-CM | POA: Diagnosis not present

## 2016-01-22 MED FILL — metroNIDAZOLE 0.75 % GEL: 0.75 | 20 days supply | Qty: 45 | Fill #1

## 2016-01-22 MED FILL — ESZOPICLONE 3 MG TABLET: 3 | 30 days supply | Qty: 30 | Fill #4

## 2016-02-06 ENCOUNTER — Other Ambulatory Visit: Payer: Self-pay | Admitting: Obstetrics & Gynecology

## 2016-02-06 MED ORDER — ALPRAZOLAM 0.5 MG PO TABS: ORAL_TABLET | ORAL | Status: DC

## 2016-02-06 NOTE — Telephone Encounter (Signed)
Patient request refill of Xanax 0.5 mg.  Last annual exam 09/11/15

## 2016-02-07 ENCOUNTER — Telehealth: Payer: Self-pay | Admitting: *Deleted

## 2016-02-07 NOTE — Telephone Encounter (Signed)
Can you call and clarify rx for pt?  I think it is for alpraxolam 0.5mg  1 -1 1/2 tabs nightly prn insomnia.  #45 tabs/5RF given.

## 2016-02-07 NOTE — Telephone Encounter (Signed)
Fax from [facility] states "Please clarify how many times daily patient can use, thank you".  Dr. Sabra Heck please advise.

## 2016-02-10 MED FILL — ALPRAZolam 0.5 MG TABS: 0.5 | 30 days supply | Qty: 45 | Fill #0

## 2016-02-10 NOTE — Telephone Encounter (Signed)
[place] and s/w pharmacist [facility] rx given as take 1-1 1/2 tablets nightly prn insomnia #45/5 rfs.  Routed to provider for review, encounter closed.

## 2016-02-14 MED FILL — FLUTICASONE PROP 50 MCG SPR: 50 | 30 days supply | Qty: 16 | Fill #1

## 2016-02-14 MED FILL — FLUCONAZOLE 150 MG TABLET: 150 | 3 days supply | Qty: 2 | Fill #1

## 2016-02-21 MED FILL — ESZOPICLONE 3 MG TABLET: 3 | 30 days supply | Qty: 30 | Fill #5

## 2016-03-23 ENCOUNTER — Other Ambulatory Visit: Payer: Self-pay | Admitting: Obstetrics & Gynecology

## 2016-03-23 MED FILL — ESTRADIOL 0.1 MG PATCH: 0.1 | 84 days supply | Qty: 24 | Fill #2

## 2016-03-23 NOTE — Telephone Encounter (Signed)
Routing refill request to Dr. Miller

## 2016-03-25 DIAGNOSIS — Z1231 Encounter for screening mammogram for malignant neoplasm of breast: Secondary | ICD-10-CM | POA: Diagnosis not present

## 2016-03-25 MED FILL — ESZOPICLONE 3 MG TABLET: 3 | 30 days supply | Qty: 30 | Fill #0

## 2016-03-25 MED FILL — DESVENLAFAXINE SUC ER 100 M: 100 | 90 days supply | Qty: 90 | Fill #1

## 2016-04-08 ENCOUNTER — Encounter: Payer: Self-pay | Admitting: Obstetrics & Gynecology

## 2016-04-22 MED FILL — ALPRAZolam 0.5 MG TABS: 0.5 | 30 days supply | Qty: 45 | Fill #1

## 2016-04-22 MED FILL — BUPROPION HCL XL 300 MG TAB: 300 | 90 days supply | Qty: 90 | Fill #2

## 2016-04-22 MED FILL — ESZOPICLONE 3 MG TABLET: 3 | 30 days supply | Qty: 30 | Fill #1

## 2016-04-22 MED FILL — DICLOFENAC SOD EC 75 MG TAB: 75 | 30 days supply | Qty: 60 | Fill #1

## 2016-04-29 ENCOUNTER — Other Ambulatory Visit: Payer: Self-pay | Admitting: *Deleted

## 2016-04-29 NOTE — Telephone Encounter (Signed)
Medication refill request: Vivelle Dot and Spironolactone  Last AEX:  09/11/15 SM Next AEX: None Last MMG (if hormonal medication request): BIRADS1:neg Refill authorized: 09/11/15 Vivelle Dot 324/4R. Spironolactone #180tabs/4R.

## 2016-05-01 MED ORDER — SPIRONOLACTONE 100 MG PO TABS: 100.0000 mg | ORAL_TABLET | Freq: Two times a day (BID) | ORAL | Status: DC

## 2016-05-01 MED ORDER — ESTRADIOL 0.1 MG/24HR TD PTTW: MEDICATED_PATCH | TRANSDERMAL | Status: DC

## 2016-05-01 MED FILL — SPIRONOLACTONE 100 MG TAB: 100 | 90 days supply | Qty: 180 | Fill #0

## 2016-05-22 MED FILL — ESZOPICLONE 3 MG TABLET: 3 | 30 days supply | Qty: 30 | Fill #2

## 2016-05-22 MED FILL — ALPRAZolam 0.5 MG TABS: 0.5 | 30 days supply | Qty: 45 | Fill #2

## 2016-06-03 ENCOUNTER — Encounter: Payer: Self-pay | Admitting: Obstetrics and Gynecology

## 2016-06-03 ENCOUNTER — Ambulatory Visit (INDEPENDENT_AMBULATORY_CARE_PROVIDER_SITE_OTHER): Payer: 59 | Admitting: Obstetrics and Gynecology

## 2016-06-03 VITALS — BP 122/88 | HR 80 | Resp 15

## 2016-06-03 DIAGNOSIS — Z1272 Encounter for screening for malignant neoplasm of vagina: Secondary | ICD-10-CM | POA: Diagnosis not present

## 2016-06-03 DIAGNOSIS — N898 Other specified noninflammatory disorders of vagina: Secondary | ICD-10-CM

## 2016-06-03 DIAGNOSIS — Z8542 Personal history of malignant neoplasm of other parts of uterus: Secondary | ICD-10-CM

## 2016-06-03 DIAGNOSIS — N761 Subacute and chronic vaginitis: Secondary | ICD-10-CM | POA: Diagnosis not present

## 2016-06-03 DIAGNOSIS — N939 Abnormal uterine and vaginal bleeding, unspecified: Secondary | ICD-10-CM

## 2016-06-03 LAB — POCT URINALYSIS DIPSTICK
BILIRUBIN UA: NEGATIVE
Blood, UA: NEGATIVE
GLUCOSE UA: NEGATIVE
KETONES UA: NEGATIVE
Leukocytes, UA: NEGATIVE
Nitrite, UA: NEGATIVE
Protein, UA: NEGATIVE
UROBILINOGEN UA: NEGATIVE
pH, UA: 6.5

## 2016-06-03 NOTE — Progress Notes (Signed)
GYNECOLOGY  VISIT   HPI: 50 y.o.   Married  Caucasian  female   G3P2 with No LMP recorded. Patient has had a hysterectomy.   here c/o vaginal bleeding this morning. She noticed a few spots of blood on the toilet tissue this morning, none since. Since the blood she has had slight vulvar irritation. No increased vaginal discharge. No urinary frequency, urgency or dysuria.  She has a h/o a TLH/BSO for endometrial cancer.    GYNECOLOGIC HISTORY: No LMP recorded. Patient has had a hysterectomy. Contraception:hysterectomy  Menopausal hormone therapy: estradiol        OB History    Gravida Para Term Preterm AB Living   3 2       2    SAB TAB Ectopic Multiple Live Births                     Patient Active Problem List   Diagnosis Date Noted  . Stress incontinence 09/11/2015  . Unsteady gait 04/16/2014  . Vertigo   . 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 Endometrial adenocarcinoma ([place]) 02/10/2012  . Allergic rhinitis 10/02/2007    Past Medical History:  Diagnosis Date  . Endometrioid adenocarcinoma    Stage IA grade 1  . Eye pressure    elevated  . Infertility, female    clomid and IUI x 3  . Menorrhagia   . Migraines    migraine headaches  . Morbid obesity ([place])   . Rapid heartbeat   . Varicose veins   . Vertigo     Past Surgical History:  Procedure Laterality Date  . CESAREAN SECTION     x2  . DIAGNOSTIC LAPAROSCOPY     for infertility that was negative  . HERNIA REPAIR  3/12  . LAPAROSCOPIC CHOLECYSTECTOMY  1994  . LAPAROSCOPIC RADICAL TOTAL HYSTERECTOMY W/ NODE BIOPSY  12/10   with BSO  . NASAL SINUS SURGERY    . 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 TONSILLECTOMY     x 3    Current Outpatient Prescriptions  Medication Sig Dispense Refill  . ALPRAZolam (XANAX) 0.5 MG tablet Take one-half to one tablet as needed for sleep or anxiety. 45 tablet 3  . buPROPion (WELLBUTRIN XL) 300 MG 24 hr tablet Take 1 tablet (300 mg total) by mouth daily.  90 tablet 4  . desvenlafaxine (PRISTIQ) 50 MG 24 hr tablet Take 1 tablet (50 mg total) by mouth daily. 90 tablet 4  . diclofenac (VOLTAREN) 75 MG EC tablet Take 75 mg by mouth as needed.    Marland Kitchen estradiol (VIVELLE-DOT) 0.1 MG/24HR patch PLACE 1 PATCH 0NTO THE SKIN 2 TIMES A WEEK. 24 patch 4  . Eszopiclone 3 MG TABS TAKE 1 TABLET BY MOUTH AT BEDTIME 30 tablet 5  . metroNIDAZOLE (METROGEL) 0.75 % gel Apply 1 application topically 2 (two) times daily.  3  . Multiple Vitamins-Minerals (MULTIVITAMIN PO) Take 1 tablet by mouth daily.    . promethazine (PHENERGAN) 12.5 MG tablet 1-2 tablets po q4-6 hours prn nausea 30 tablet 0  . spironolactone (ALDACTONE) 100 MG tablet Take 1 tablet (100 mg total) by mouth 2 (two) times daily. 180 tablet 4  . topiramate (TOPAMAX) 50 MG tablet TAKE 2 TABLETS BY MOUTH 2 TIMES A DAY     No current facility-administered medications for this visit.      ALLERGIES: Review of patient's allergies indicates no known allergies.  Family History  Problem  Relation Age of Onset  . Coronary artery disease Mother   . Heart disease Mother   . Colon cancer Father   . Coronary artery disease Father   . Diabetes Father   . Cancer Father     lung  . Heart disease Father   . Prostate cancer Paternal Grandfather   . Cancer Paternal Grandfather     prostate  . Cancer Maternal Uncle     throat  . Lung cancer Paternal Grandmother   . Cancer Maternal Grandmother     lung    Social History   Social History  . Marital status: Married    Spouse name: Merry Proud  . Number of children: 2  . Years of education: college   Occupational History  .      [employer]   Social History Main Topics  . Smoking status: Never Smoker  . Smokeless tobacco: Never Used  . Alcohol use No  . Drug use: No  . Sexual activity: Yes    Partners: Male    Birth control/ protection: Surgical     Comment: Hyst   Other Topics Concern  . Not on file   Social History Narrative   Patient lives  at home with her husband Merry Proud). Patient has two children and works full time for [employer].   Caffeine patient drinks a very mild amount not daily.    Review of Systems  Constitutional: Negative.   HENT: Negative.   Eyes: Negative.   Respiratory: Negative.   Cardiovascular: Negative.   Gastrointestinal: Negative.   Genitourinary:       Vaginal bleeding  Musculoskeletal: Negative.   Skin: Negative.   Neurological: Negative.   Endo/Heme/Allergies: Negative.   Psychiatric/Behavioral: Negative.     PHYSICAL EXAMINATION:    BP 122/88 (BP Location: Right Arm, Patient Position: Sitting, Cuff Size: Normal)   Pulse 80   Resp 15     General appearance: alert, cooperative and appears stated age Abdomen: soft, non-tender; bowel sounds normal; no masses,  no organomegaly  Pelvic: External genitalia:  no lesions              Urethra:  normal appearing urethra with no masses, tenderness or lesions              Bartholins and Skenes: normal                 Vagina: normal appearing vagina a slight increase in watery white vaginal discharge. There are a few small spots on the posterior vaginal wall about 1.5 cm below the cuff that appear like granulation tissue. A pap smear was obtained of this area and of the vaginal cuff. The largest of the lesions was biopsied with a tishler biopsy forcep after cleansing with betadine. Base treated with silver nitrate.              Cervix: absent              Bimanual Exam:  Uterus:  uterus absent              Adnexa: no mass, fullness, tenderness               Chaperone was present for exam.  ASSESSMENT Light vaginal bleeding Small lesions on the posterior vaginal wall, appear like granulation tissue, biopsy done Vaginal discharge    PLAN Vaginal pap Vaginal biopsy Wet prep probe Urine dip negative for blood   An After Visit  Summary was printed and given to the patient.

## 2016-06-04 ENCOUNTER — Other Ambulatory Visit: Payer: Self-pay | Admitting: Obstetrics and Gynecology

## 2016-06-04 DIAGNOSIS — N939 Abnormal uterine and vaginal bleeding, unspecified: Secondary | ICD-10-CM | POA: Diagnosis not present

## 2016-06-04 LAB — WET PREP BY MOLECULAR PROBE
Candida species: POSITIVE — AB
Gardnerella vaginalis: NEGATIVE
Trichomonas vaginosis: NEGATIVE

## 2016-06-04 MED ORDER — FLUCONAZOLE 150 MG PO TABS: ORAL_TABLET | ORAL | 0 refills | Status: DC

## 2016-06-08 ENCOUNTER — Other Ambulatory Visit: Payer: Self-pay | Admitting: Obstetrics and Gynecology

## 2016-06-08 DIAGNOSIS — R790 Abnormal level of blood mineral: Secondary | ICD-10-CM

## 2016-06-08 DIAGNOSIS — D72819 Decreased white blood cell count, unspecified: Secondary | ICD-10-CM

## 2016-06-08 DIAGNOSIS — E672 Megavitamin-B6 syndrome: Secondary | ICD-10-CM

## 2016-06-08 LAB — IPS PAP TEST WITH REFLEX TO HPV

## 2016-06-15 MED FILL — FLUCONAZOLE 150 MG TABLET: 150 | 3 days supply | Qty: 2 | Fill #0

## 2016-06-19 ENCOUNTER — Other Ambulatory Visit: Payer: Self-pay | Admitting: Obstetrics and Gynecology

## 2016-06-19 MED ORDER — TOPIRAMATE 50 MG PO TABS: ORAL_TABLET | ORAL | 1 refills | Status: DC

## 2016-06-19 MED FILL — ALPRAZolam 0.5 MG TABS: 0.5 | 30 days supply | Qty: 45 | Fill #3

## 2016-06-19 MED FILL — ESZOPICLONE 3 MG TABLET: 3 | 30 days supply | Qty: 30 | Fill #3

## 2016-06-19 MED FILL — TOPIRAMATE 50 MG TABLET: 50 | 90 days supply | Qty: 360 | Fill #0

## 2016-06-19 NOTE — Progress Notes (Signed)
Refill for topamax sent.  

## 2016-06-26 ENCOUNTER — Ambulatory Visit (INDEPENDENT_AMBULATORY_CARE_PROVIDER_SITE_OTHER): Payer: 59 | Admitting: Obstetrics & Gynecology

## 2016-06-26 VITALS — BP 126/90 | HR 64 | Resp 14

## 2016-06-26 DIAGNOSIS — Z8542 Personal history of malignant neoplasm of other parts of uterus: Secondary | ICD-10-CM

## 2016-06-26 DIAGNOSIS — N939 Abnormal uterine and vaginal bleeding, unspecified: Secondary | ICD-10-CM

## 2016-07-04 ENCOUNTER — Encounter: Payer: Self-pay | Admitting: Obstetrics & Gynecology

## 2016-07-04 NOTE — Progress Notes (Signed)
GYNECOLOGY  VISIT   HPI: 50 y.o. G3P2 Married Caucasian female with recurrent vaginal bleeding.  Pt with hx of TLH/BSO 09/24/09 for endometrial adenocarcinoma.  Pt swa Dr. Bryon Lions 06/03/16 for the same issue.  Pap and tissue biopsy was obtained.  Pap was normal and biopsy showed inflammation.  There was yeast on the Pap smear.    Pt wondering if this is urinary.  Denies any urinary symptoms, however.  Has not seen blood in urine.  Is just after she wipes.  No blood in underwear.  No vaginal discharge.   Patient Active Problem List   Diagnosis Date Noted  . Stress incontinence 09/11/2015  . Unsteady gait 04/16/2014  . Vertigo   . 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 Endometrial adenocarcinoma ([place]) 02/10/2012  . Allergic rhinitis 10/02/2007    Past Medical History:  Diagnosis Date  . Endometrioid adenocarcinoma    Stage IA grade 1  . Eye pressure    elevated  . Infertility, female    clomid and IUI x 3  . Menorrhagia   . Migraines    migraine headaches  . Morbid obesity ([place])   . Rapid heartbeat   . Varicose veins   . Vertigo     Past Surgical History:  Procedure Laterality Date  . CESAREAN SECTION     x2  . DIAGNOSTIC LAPAROSCOPY     for infertility that was negative  . HERNIA REPAIR  3/12  . LAPAROSCOPIC CHOLECYSTECTOMY  1994  . LAPAROSCOPIC RADICAL TOTAL HYSTERECTOMY W/ NODE BIOPSY  12/10   with BSO  . NASAL SINUS SURGERY    . 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 TONSILLECTOMY     x 3    MEDS:  Reviewed in EPIC and UTD  ALLERGIES: Review of patient's allergies indicates no known allergies.  Family History  Problem Relation Age of Onset  . Coronary artery disease Mother   . Heart disease Mother   . Colon cancer Father   . Coronary artery disease Father   . Diabetes Father   . Cancer Father     lung  . Heart disease Father   . Prostate cancer Paternal Grandfather   . Cancer Paternal Grandfather     prostate  . Cancer Maternal Uncle      throat  . Lung cancer Paternal Grandmother   . Cancer Maternal Grandmother     lung    SH:  Married, non smoker  Review of Systems  All other systems reviewed and are negative.   PHYSICAL EXAMINATION:    BP 126/90 (BP Location: Left Arm, Patient Position: Sitting, Cuff Size: Large)   Pulse 64   Resp 14     General appearance: alert, cooperative and appears stated age Abdomen: soft, non-tender; bowel sounds normal; no masses,  no organomegaly  Pelvic: External genitalia:  no lesions              Urethra:  normal appearing urethra with no masses, tenderness or lesions              Bartholins and Skenes: normal                 Vagina: normal appearing vagina with normal color and discharge, area about 2 cm from cuff on posterior side just to pt's left of midline with erythematous lesion that appears to be an area of healing from prior biopsy, no other lesions present  Cervix: absent              Bimanual Exam:  Uterus:  uterus absent              Adnexa: no mass, fullness, tenderness              Anus:  normal sphincter tone, no lesions  Assessment: PMP bleeding/spotting in pt with h/o endometrial cancer Normal pap and normal tissue biopsy 06/03/16 Neg dip urine today and 06/03/16  Plan: Pt will monitor.  I feel this is healing from prior biopsy and silver nitrate treatment.  Will recheck if still having spotting after four weeks.  As there is no evidence for return of cancer, feel very comfortable watching conservatively.

## 2016-07-08 MED FILL — DESVENLAFAXINE SUC ER 100 M: 100 | 90 days supply | Qty: 90 | Fill #2

## 2016-07-08 MED FILL — ESTRADIOL 0.1 MG PATCH: 0.1 | 84 days supply | Qty: 24 | Fill #3

## 2016-07-17 MED FILL — ALPRAZolam 0.5 MG TABS: 0.5 | 30 days supply | Qty: 45 | Fill #4

## 2016-07-17 MED FILL — LUNESTA 3 MG TABLET: 3 | 30 days supply | Qty: 30 | Fill #4

## 2016-07-24 DIAGNOSIS — H5213 Myopia, bilateral: Secondary | ICD-10-CM | POA: Diagnosis not present

## 2016-07-24 DIAGNOSIS — H524 Presbyopia: Secondary | ICD-10-CM | POA: Diagnosis not present

## 2016-07-24 DIAGNOSIS — H52223 Regular astigmatism, bilateral: Secondary | ICD-10-CM | POA: Diagnosis not present

## 2016-07-24 DIAGNOSIS — H40023 Open angle with borderline findings, high risk, bilateral: Secondary | ICD-10-CM | POA: Diagnosis not present

## 2016-08-03 MED FILL — BUPROPION HCL XL 300 MG TAB: 300 | 90 days supply | Qty: 90 | Fill #3

## 2016-08-06 ENCOUNTER — Other Ambulatory Visit: Payer: Self-pay | Admitting: Obstetrics & Gynecology

## 2016-08-06 MED ORDER — AMOXICILLIN-POT CLAVULANATE 875-125 MG PO TABS: 1.0000 | ORAL_TABLET | Freq: Two times a day (BID) | ORAL | 0 refills | Status: DC

## 2016-08-06 MED FILL — AMOX TR-K CLV 875-125 MG TA: 875-125 | 7 days supply | Qty: 14 | Fill #0

## 2016-08-07 DIAGNOSIS — H00031 Abscess of right upper eyelid: Secondary | ICD-10-CM | POA: Diagnosis not present

## 2016-08-07 DIAGNOSIS — H00032 Abscess of right lower eyelid: Secondary | ICD-10-CM | POA: Diagnosis not present

## 2016-08-07 MED FILL — NEOMYC-POLYM-DEXAMET EYE OI: 3.5-10000-0 | 7 days supply | Qty: 4 | Fill #0

## 2016-08-17 MED FILL — LUNESTA 3 MG TABLET: 3 | 30 days supply | Qty: 30 | Fill #5

## 2016-09-18 ENCOUNTER — Other Ambulatory Visit: Payer: Self-pay | Admitting: Obstetrics & Gynecology

## 2016-09-18 MED ORDER — ESZOPICLONE 3 MG PO TABS: 3.0000 mg | ORAL_TABLET | Freq: Every day | ORAL | 5 refills | Status: DC

## 2016-09-18 MED FILL — ESZOPICLONE 3 MG TABLET: 3 | 30 days supply | Qty: 30 | Fill #0

## 2016-10-07 MED FILL — ESTRADIOL 0.1 MG PATCH: 0.1 | 84 days supply | Qty: 24 | Fill #0

## 2016-10-15 MED FILL — DESVENLAFAXINE SUC ER 100 M: 100 | 90 days supply | Qty: 90 | Fill #3

## 2016-10-15 MED FILL — SPIRONOLACTONE 100 MG TAB: 100 | 90 days supply | Qty: 180 | Fill #1

## 2016-10-15 MED FILL — TOPIRAMATE 50 MG TABLET: 50 | 90 days supply | Qty: 360 | Fill #1

## 2016-10-16 MED FILL — ESZOPICLONE 3 MG TABLET: 3 | 30 days supply | Qty: 30 | Fill #1

## 2016-11-16 MED FILL — ESZOPICLONE 3 MG TABLET: 3 | 30 days supply | Qty: 30 | Fill #2

## 2016-12-10 ENCOUNTER — Encounter (INDEPENDENT_AMBULATORY_CARE_PROVIDER_SITE_OTHER): Payer: 59 | Admitting: Family Medicine

## 2016-12-15 ENCOUNTER — Other Ambulatory Visit: Payer: Self-pay | Admitting: Obstetrics and Gynecology

## 2016-12-15 MED ORDER — PROMETHAZINE HCL 12.5 MG PO TABS: ORAL_TABLET | ORAL | 0 refills | Status: DC

## 2016-12-15 NOTE — Progress Notes (Signed)
The patient called, c/o GI upset, requesting phenergan. Phenergan sent to the pharmacy

## 2016-12-18 MED FILL — ESZOPICLONE 3 MG TABLET: 3 | 30 days supply | Qty: 30 | Fill #3

## 2016-12-24 DIAGNOSIS — F3341 Major depressive disorder, recurrent, in partial remission: Secondary | ICD-10-CM | POA: Diagnosis not present

## 2016-12-24 DIAGNOSIS — R7301 Impaired fasting glucose: Secondary | ICD-10-CM | POA: Diagnosis not present

## 2016-12-24 DIAGNOSIS — Z0000 Encounter for general adult medical examination without abnormal findings: Secondary | ICD-10-CM | POA: Diagnosis not present

## 2016-12-24 DIAGNOSIS — R51 Headache: Secondary | ICD-10-CM | POA: Diagnosis not present

## 2016-12-24 DIAGNOSIS — E559 Vitamin D deficiency, unspecified: Secondary | ICD-10-CM | POA: Diagnosis not present

## 2016-12-25 ENCOUNTER — Encounter: Payer: Self-pay | Admitting: Neurology

## 2016-12-25 MED FILL — VIT D2 1.25 MG (50,000 UNIT: 1.25 MG | 84 days supply | Qty: 12 | Fill #0

## 2017-01-07 ENCOUNTER — Encounter (INDEPENDENT_AMBULATORY_CARE_PROVIDER_SITE_OTHER): Payer: 59 | Admitting: Family Medicine

## 2017-01-19 MED FILL — ESZOPICLONE 3 MG TABLET: 3 | 30 days supply | Qty: 30 | Fill #4

## 2017-02-02 ENCOUNTER — Encounter: Payer: Self-pay | Admitting: Neurology

## 2017-02-02 ENCOUNTER — Ambulatory Visit (INDEPENDENT_AMBULATORY_CARE_PROVIDER_SITE_OTHER): Payer: 59 | Admitting: Neurology

## 2017-02-02 VITALS — BP 124/82 | HR 73 | Ht 67.0 in | Wt 237.1 lb

## 2017-02-02 DIAGNOSIS — F329 Major depressive disorder, single episode, unspecified: Secondary | ICD-10-CM

## 2017-02-02 DIAGNOSIS — R413 Other amnesia: Secondary | ICD-10-CM | POA: Diagnosis not present

## 2017-02-02 DIAGNOSIS — R930 Abnormal findings on diagnostic imaging of skull and head, not elsewhere classified: Secondary | ICD-10-CM

## 2017-02-02 DIAGNOSIS — G44211 Episodic tension-type headache, intractable: Secondary | ICD-10-CM

## 2017-02-02 DIAGNOSIS — G43009 Migraine without aura, not intractable, without status migrainosus: Secondary | ICD-10-CM

## 2017-02-02 DIAGNOSIS — R9082 White matter disease, unspecified: Secondary | ICD-10-CM

## 2017-02-02 MED ORDER — RIZATRIPTAN BENZOATE 10 MG PO TABS: ORAL_TABLET | ORAL | 2 refills | Status: DC

## 2017-02-02 MED ORDER — TOPIRAMATE ER 50 MG PO CAP24: 150.0000 mg | ORAL_CAPSULE | Freq: Every day | ORAL | 2 refills | Status: DC

## 2017-02-02 MED FILL — RIZATRIPTAN 10 MG TABLET: 10 | 30 days supply | Qty: 10 | Fill #0

## 2017-02-02 NOTE — Progress Notes (Signed)
NEUROLOGY CONSULTATION NOTE  GRACILYN GUNIA MRN: [id number] DOB: October 18, 1966  Referring provider: Dr. Melford Aase Primary care provider: Dr. Melford Aase  Reason for consult:  headache  HISTORY OF PRESENT ILLNESS: Judith Holland is a 51 year old woman with depression and history of endometrial cancer in 2010 who presents for headache and memory problems.   History supplemented by PCP note.   Onset:  She has had headaches on and off for several years.  They became more frequent in 2016.  Around that time, she also began experiencing memory deficits as well as poor coordination and vertigo.  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  MRI of brain without contrast from 08/22/15 revealed nonspecific small foci of T2 hyperintensity in the cerebral white matter.  She reportedly had a repeat MRI this past November, but a comparison was not made.  MS was considered. As for the headaches: Location:  Above the right eye Quality:  sharp Intensity:  7/10 Aura:  no Prodrome:  no Postdrome:  no Associated symptoms:  Phonophobia.  Sometimes nausea.  No photophobia or visual disturbance.  She has not had any new worse headache of her life, waking up from sleep Duration:  1 to 2 days Frequency:  Twice a month Frequency of abortive medication: diclofenac 3 to 4 days a week. Triggers/exacerbating factors:  If she forgets to take her estradiol, fatigue, onset of tension headache. Relieving factors:  Turning lights down Activity:  Cannot function.  She also has tension type headaches that begin at the back of her neck and radiate up to the front, non-throbbing, 3-4/10, lasting up to 3 days and occurring twice a month.  Past NSAIDS:  Cannot take most NSAIDs due to gastric bypass surgery Past analgesics:  no Past abortive triptans:  Sumatriptan tablet, Treximet (side effects) Past muscle relaxants:  no Past anti-emetic:  no Past antihypertensive medications:   no Past antidepressant medications:  no Past anticonvulsant medications:  no Past vitamins/Herbal/Supplements:  no Other past therapies:  no  Current NSAIDS:  diclofenac Current analgesics:  no Current triptans:  no Current anti-emetic:  promethazine Current muscle relaxants:  no Current anti-anxiolytic:  alprazolam Current sleep aide:  Lunesta Current Antihypertensive medications:  spironolactone Current Antidepressant medications:  Bupropion, Pristiq Current Anticonvulsant medications:  topiramate 100mg  Current Vitamins/Herbal/Supplements:  no Current Antihistamines/Decongestants:  no Other therapy:  no  Caffeine:  tea Alcohol:  no Smoker:  no Diet:  hydrates Exercise:  no Depression/anxiety:  grieving since passing of her mother in September. Sleep hygiene:  Okay with Lunesta Family history of headache:  mom  She continues to have memory deficits that precede initiation of topiramate.  She has word-finding difficulty.  Sometimes she cannot recall the name of people she knows.  She will sometimes briefly get disoriented while driving.  12/24/16 LABS:  CBC with WBC 3.1, HGB 11.9, HCT 36.2, and PLT 229; CMP with Na 139, K 4.2, Cl 102, CO2 24, glucose 90, BUN 18, Cr 0.80, total bili 0.3, ALP 68, AST 30 and ALT 23; TSH 2.120.  PAST MEDICAL HISTORY: Past Medical History:  Diagnosis Date  . Endometrioid adenocarcinoma    Stage IA grade 1  . Eye pressure    elevated  . Infertility, female    clomid and IUI x 3  . Menorrhagia   . Migraines    migraine headaches  . Morbid obesity ([place])   . Rapid heartbeat   . Varicose veins   .  Vertigo     PAST SURGICAL HISTORY: Past Surgical History:  Procedure Laterality Date  . CESAREAN SECTION     x2  . DIAGNOSTIC LAPAROSCOPY     for infertility that was negative  . HERNIA REPAIR  3/12  . LAPAROSCOPIC CHOLECYSTECTOMY  1994  . LAPAROSCOPIC RADICAL TOTAL HYSTERECTOMY W/ NODE BIOPSY  12/10   with BSO  . NASAL SINUS SURGERY    .  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 TONSILLECTOMY     x 3    MEDICATIONS: Current Outpatient Prescriptions on File Prior to Visit  Medication Sig Dispense Refill  . ALPRAZolam (XANAX) 0.5 MG tablet Take one-half to one tablet as needed for sleep or anxiety. 45 tablet 3  . buPROPion (WELLBUTRIN XL) 300 MG 24 hr tablet Take 1 tablet (300 mg total) by mouth daily. 90 tablet 4  . estradiol (VIVELLE-DOT) 0.1 MG/24HR patch PLACE 1 PATCH 0NTO THE SKIN 2 TIMES A WEEK. 24 patch 4  . Eszopiclone 3 MG TABS Take 1 tablet (3 mg total) by mouth at bedtime. Take immediately before bedtime 30 tablet 5  . spironolactone (ALDACTONE) 100 MG tablet Take 1 tablet (100 mg total) by mouth 2 (two) times daily. (Patient taking differently: Take 100 mg by mouth daily. ) 180 tablet 4  . metroNIDAZOLE (METROGEL) 0.75 % gel Apply 1 application topically 2 (two) times daily.  3  . Multiple Vitamins-Minerals (MULTIVITAMIN PO) Take 1 tablet by mouth daily.    . promethazine (PHENERGAN) 12.5 MG tablet 1-2 tablets po q 4 hours prn nausea (Patient not taking: Reported on 02/02/2017) 20 tablet 0   No current facility-administered medications on file prior to visit.     ALLERGIES: No Known Allergies  FAMILY HISTORY: Family History  Problem Relation Age of Onset  . Coronary artery disease Mother   . Heart disease Mother   . Colon cancer Father   . Coronary artery disease Father   . Diabetes Father   . Cancer Father     lung  . Heart disease Father   . Prostate cancer Paternal Grandfather   . Cancer Paternal Grandfather     prostate  . Cancer Maternal Uncle     throat  . Lung cancer Paternal Grandmother   . Cancer Maternal Grandmother     lung    SOCIAL HISTORY: Social History   Social History  . Marital status: Married    Spouse name: Merry Proud  . Number of children: 2  . Years of education: college   Occupational History  .      [employer]   Social History Main Topics  . Smoking status: Never  Smoker  . Smokeless tobacco: Never Used  . Alcohol use No  . Drug use: No  . Sexual activity: Yes    Partners: Male    Birth control/ protection: Surgical     Comment: Hyst   Other Topics Concern  . Not on file   Social History Narrative   Patient lives at home with her husband Merry Proud). Patient has two children and works full time for [employer].   Caffeine patient drinks a very mild amount not daily.    REVIEW OF SYSTEMS: Constitutional: No fevers, chills, or sweats, no generalized fatigue, change in appetite Eyes: No visual changes, double vision, eye pain Ear, nose and throat: No hearing loss, ear pain, nasal congestion, sore throat Cardiovascular: No chest pain, palpitations Respiratory:  No shortness of breath  at rest or with exertion, wheezes GastrointestinaI: No nausea, vomiting, diarrhea, abdominal pain, fecal incontinence Genitourinary:  No dysuria, urinary retention or frequency Musculoskeletal:  No neck pain, back pain Integumentary: No rash, pruritus, skin lesions Neurological: as above Psychiatric: depression Endocrine: No palpitations, fatigue, diaphoresis, mood swings, change in appetite, change in weight, increased thirst Hematologic/Lymphatic:  No purpura, petechiae. Allergic/Immunologic: no itchy/runny eyes, nasal congestion, recent allergic reactions, rashes  PHYSICAL EXAM: Vitals:   02/02/17 1306  BP: 124/82  Pulse: 73   General: No acute distress.  Patient appears well-groomed.  Head:  Normocephalic/atraumatic Eyes:  fundi examined but not visualized Neck: supple, no paraspinal tenderness, full range of motion Back: No paraspinal tenderness Heart: regular rate and rhythm Lungs: Clear to auscultation bilaterally. Vascular: No carotid bruits. Neurological Exam: Mental status: alert and oriented to person, place, and time, recent and remote memory intact, fund of knowledge intact, attention and concentration intact,  speech fluent and not dysarthric, language intact.  Able to complete Trail Making Test.  Able to draw a clock MMSE - Mini Mental State Exam 02/02/2017  Orientation to time 5  Orientation to Place 5  Registration 3  Attention/ Calculation 4  Recall 3  Language- name 2 objects 2  Language- repeat 1  Language- follow 3 step command 3  Language- read & follow direction 1  Write a sentence 1  Copy design 1  Total score 29   Cranial nerves: CN I: not tested CN II: pupils equal, round and reactive to light, visual fields intact CN III, IV, VI:  full range of motion, no nystagmus, no ptosis CN V: facial sensation intact CN VII: upper and lower face symmetric CN VIII: hearing intact CN IX, X: gag intact, uvula midline CN XI: sternocleidomastoid and trapezius muscles intact CN XII: tongue midline Bulk & Tone: normal, no fasciculations. Motor:  5/5 throughout  Sensation: temperature and vibration sensation intact. Deep Tendon Reflexes:  2+ throughout, toes downgoing.  Finger to nose testing:  Without dysmetria.  Heel to shin:  Without dysmetria.  Gait:  Normal station and stride.  Able to turn and tandem walk. Romberg negative.  IMPRESSION: Migraine Tension-type headache Memory deficits Abnormal white mater on brain MRI Depression  PLAN: 1.  Due to persistent memory deficits, will switch from topiramate to topiramate ER, increasing dose to 150mg  at bedtime. 2.  Maxalt for migraine abortive therapy 3.  Diclofenac for tension type headache, limit to no more than 2 days out of the week. 4.  Lifestyle modification:  Exercise, treatment for depression 5.  Neuropsychological testing 6.  I asked that she bring me CDs of both MRIs performed for my review and comparison. 7.  Follow up in 3 months.  Thank you for allowing me to take part in the care of this patient.  Metta Clines, DO  CC:  Anastasia Pall, MD

## 2017-02-02 NOTE — Patient Instructions (Signed)
Migraine Recommendations: 1.  We will switch topamax to extended release topamax (Trokendi) and increase dose to 150mg  at bedtime.  Call in 4 weeks with update and we can adjust dose if needed. 2.  Take rizatriptan 10mg  at earliest onset of headache.  May repeat dose once in 2 hours if needed.  Do not exceed two tablets in 24 hours. 3.  Use diclofenac only for tension headaches.  Limit use of pain relievers to no more than 2 days out of the week.  These medications include acetaminophen, ibuprofen, triptans and narcotics.  This will help reduce risk of rebound headaches. 4.  Be aware of common food triggers such as processed sweets, processed foods with nitrites (such as deli meat, hot dogs, sausages), foods with MSG, alcohol (such as wine), chocolate, certain cheeses, certain fruits (dried fruits, some citrus fruit), vinegar, diet soda. 4.  Avoid caffeine 5.  Routine exercise 6.  Proper sleep hygiene 7.  Stay adequately hydrated with water 8.  Keep a headache diary. 9.  Maintain proper stress management. 10.  Do not skip meals. 11.  Consider supplements:  Magnesium citrate 400mg  to 600mg  daily, riboflavin 400mg , Coenzyme Q 10 100mg  three times daily 12.  Please bring me the CDs of both MRIs to review. 79.  Will refer you to Dr. Si Raider here for neurocognitive testing.

## 2017-02-04 ENCOUNTER — Other Ambulatory Visit: Payer: Self-pay | Admitting: Obstetrics & Gynecology

## 2017-02-04 MED FILL — ESTRADIOL 0.1 MG PATCH: 0.1 | 84 days supply | Qty: 24 | Fill #1

## 2017-02-04 MED FILL — BUPROPION HCL XL 300 MG TAB: 300 | 90 days supply | Qty: 90 | Fill #0

## 2017-02-04 MED FILL — DESVENLAFAXINE SUC ER 100 M: 100 | 90 days supply | Qty: 90 | Fill #0

## 2017-02-04 NOTE — Telephone Encounter (Signed)
Medication refill request: Bupropion Last AEX:  09/11/15 SM Next AEX: Not scheduled Last MMG (if hormonal medication request): 03/25/16 BIRADS1, Density B, [facility] Refill authorized: 09/11/15 #[id number] 4R. Please advise. Thank you.

## 2017-02-12 ENCOUNTER — Telehealth: Payer: Self-pay | Admitting: Neurology

## 2017-02-12 ENCOUNTER — Telehealth: Payer: Self-pay

## 2017-02-12 NOTE — Telephone Encounter (Signed)
Received a call from PT regarding medication: Extended relief Topamaz.  Patient needs a refill of medication.   Patient having side effects from medication. No  Patient calling to update [place] on medication. PT picked up a sample of the Topamax Extended here at the office while waiting for authorization for it, it was only a days supply so she wants to know if she should take the regular Topamax until the approval comes through/Dawn

## 2017-02-12 NOTE — Telephone Encounter (Signed)
Prior auth done via covermymeds for trokendi xr 50mg .  Waiting response.

## 2017-02-12 NOTE — Telephone Encounter (Signed)
Spoke with patient regarding her Trokendia.  Prior Josem Kaufmann was initiated today waiting response.  More samples given to patient today.  She has already noticed a change in her headache since starting the new dose.

## 2017-02-16 MED FILL — ESZOPICLONE 3 MG TABLET: 3 | 30 days supply | Qty: 30 | Fill #5

## 2017-02-19 ENCOUNTER — Other Ambulatory Visit: Payer: Self-pay | Admitting: *Deleted

## 2017-02-19 ENCOUNTER — Telehealth: Payer: Self-pay

## 2017-02-19 MED ORDER — TOPIRAMATE ER 150 MG PO SPRINKLE CAP24: 150.0000 mg | EXTENDED_RELEASE_CAPSULE | Freq: Every day | ORAL | 5 refills | Status: DC

## 2017-02-19 NOTE — Telephone Encounter (Signed)
So, her insurance is only approving one capsule a day?  Trokendi does not come in a 150mg  capsule but Qudexy does.  Can we try using Qudexy XR instead?  It comes in a 150mg  pill, which she can take once at bedtime.

## 2017-02-19 NOTE — Telephone Encounter (Signed)
Patient called to get status of Trokendi prior auth.  Per her insurance they only approved 1 cap a day.  Additional request sent for quantity limit.  Case [id number].  In the mean time patient has been given samples of Trokendi 50mg .  If the quantity limit does not get approved what would be next?  Also patient wanted to see if Dr Tomi Likens had a chance to review her MRI that she dropped off?  Please advise.

## 2017-02-19 NOTE — Telephone Encounter (Signed)
I called Judith Holland (rep for Qudexy) and requested for him to call me back about the 150 mg capsules.

## 2017-02-19 NOTE — Telephone Encounter (Signed)
Pieter Partridge will come by on Monday to bring in Platinum Pass cards for 0 copay.  He will also order [place] some samples.

## 2017-02-22 ENCOUNTER — Other Ambulatory Visit: Payer: Self-pay | Admitting: Neurology

## 2017-02-23 ENCOUNTER — Telehealth: Payer: Self-pay

## 2017-02-23 ENCOUNTER — Telehealth: Payer: Self-pay | Admitting: Neurology

## 2017-02-23 MED ORDER — TOPIRAMATE ER 150 MG PO SPRINKLE CAP24
150.0000 mg | EXTENDED_RELEASE_CAPSULE | Freq: Every day | ORAL | 5 refills | Status: DC
Start: 2017-02-23 — End: 2017-09-07

## 2017-02-23 MED FILL — TOPIRAMATE ER 150 MG CAP: 150 | 30 days supply | Qty: 30 | Fill #0

## 2017-02-23 NOTE — Telephone Encounter (Signed)
Spoke with patient regarding medication issues.  The qudexy was sent to the wrong pharmacy.  New rx sent to [facility].  Patient aware.

## 2017-02-23 NOTE — Telephone Encounter (Signed)
Note from pharmacy says patient was supposed to be switched to [place]?  Please advise.

## 2017-02-23 NOTE — Telephone Encounter (Signed)
Yes, Qudexy 150mg  at bedtime (150mg  pill)

## 2017-02-23 NOTE — Telephone Encounter (Signed)
This is the person for whom we're prescribing Qudexy XR 150mg  at bedtime.

## 2017-02-23 NOTE — Telephone Encounter (Signed)
Patient states that she is out of medication and she needs to talk to someone about medication. Dr Tomi Likens was going to change her medication to something different please call her today either at [phone] or [phone]

## 2017-02-23 NOTE — Telephone Encounter (Signed)
Patient aware of medication change. 

## 2017-03-18 ENCOUNTER — Telehealth: Payer: Self-pay

## 2017-03-18 NOTE — Telephone Encounter (Signed)
Patient is requesting refills for Xanax and Lunesta.  Medication refill request: Xanax and Lunesta Last AEX:  09/11/15 SM Next AEX: Not scheduled Last MMG (if hormonal medication request): 03/25/16 BIRADS1, Density B, Solis Refill authorized: 02/06/2016 for Xanax #45 3RF and  Lunesta 09/18/2016 #30 5RF Please advise. Thank you.

## 2017-03-19 MED ORDER — ALPRAZOLAM 0.5 MG PO TABS: ORAL_TABLET | ORAL | 3 refills | Status: DC

## 2017-03-19 MED ORDER — ESZOPICLONE 3 MG PO TABS: 3.0000 mg | ORAL_TABLET | Freq: Every day | ORAL | 1 refills | Status: DC

## 2017-03-19 MED FILL — ESZOPICLONE 3 MG TABLET: 3 | 90 days supply | Qty: 90 | Fill #0

## 2017-03-22 ENCOUNTER — Telehealth: Payer: Self-pay | Admitting: *Deleted

## 2017-03-22 MED FILL — ALPRAZolam 0.5 MG TABS: 0.5 | 45 days supply | Qty: 45 | Fill #0

## 2017-03-22 NOTE — Telephone Encounter (Signed)
Reviewed with Dr. Sabra Heck -Xanax 0.5mg  one-half to one tablet as needed for sleep and anxiety.  Call to pharmacy, spoke with [facility], advised as seen above per Dr. Sabra Heck. Read back and confirmed.   Routing to provider for final review. Patient is agreeable to disposition. Will close encounter.

## 2017-03-22 NOTE — Telephone Encounter (Signed)
Monica calling from [facility] outpatient pharmacy to confirm admin instructions and dosing. Last verbal RX called in on 02/07/16 for Xanax 0.5mg : take one to one-half tablet, new RX on 6/1 for Xanax 0.5mg  : take one-half to one tablet, would like to confirm dosage and instructions. Advised will confirm with Dr. Sabra Heck and return call.   Dr. Sabra Heck, please confirm admin instructions & dosage for xanax.

## 2017-03-25 MED FILL — TOPIRAMATE ER 150 MG CAP: 150 | 30 days supply | Qty: 30 | Fill #1

## 2017-04-29 MED FILL — TOPIRAMATE ER 150 MG CAP: 150 | 30 days supply | Qty: 30 | Fill #2

## 2017-04-30 MED FILL — DESVENLAFAXINE SUC ER 100 M: 100 | 90 days supply | Qty: 90 | Fill #1

## 2017-04-30 MED FILL — spIRONOLACTONE 100 MG TAB: 100 | 90 days supply | Qty: 180 | Fill #2

## 2017-04-30 MED FILL — BUPROPION HCL XL 300 MG TAB: 300 | 90 days supply | Qty: 90 | Fill #1

## 2017-05-11 ENCOUNTER — Ambulatory Visit: Payer: 59 | Admitting: Neurology

## 2017-05-28 MED FILL — TOPIRAMATE ER 150 MG CAP: 150 | 30 days supply | Qty: 30 | Fill #3

## 2017-05-28 MED FILL — ALPRAZolam 0.5 MG TABS: 0.5 | 45 days supply | Qty: 45 | Fill #1

## 2017-06-15 MED FILL — ESZOPICLONE 3 MG TABS: 3 | 90 days supply | Qty: 90 | Fill #1

## 2017-07-08 MED FILL — TOPIRAMATE ER 150 MG CAP: 150 | 30 days supply | Qty: 30 | Fill #4

## 2017-07-16 ENCOUNTER — Ambulatory Visit: Payer: 59 | Admitting: Obstetrics & Gynecology

## 2017-07-26 ENCOUNTER — Other Ambulatory Visit: Payer: Self-pay | Admitting: Obstetrics & Gynecology

## 2017-07-26 MED FILL — SPIRONOLACTONE 100 MG TAB: 100 | 90 days supply | Qty: 180 | Fill #0

## 2017-07-26 MED FILL — DESVENLAFAXINE SUC ER 100 M: 100 | 90 days supply | Qty: 90 | Fill #2

## 2017-07-26 MED FILL — ESTRADIOL 0.1 MG PATCH: 0.1 | 84 days supply | Qty: 24 | Fill #0

## 2017-07-26 MED FILL — BUPROPION HCL XL 300 MG TAB: 300 | 90 days supply | Qty: 90 | Fill #2

## 2017-07-26 NOTE — Telephone Encounter (Signed)
Medication refill request: aldactone and vivelle dot Last AEX:  09/11/15 SM Next AEX: 08/26/17 SM Last MMG (if hormonal medication request): 03/25/16 BIRADS1:neg  Refill authorized: 05/01/16 #90days/4R. Today please advise.

## 2017-07-26 NOTE — Telephone Encounter (Signed)
Medication refill request: Vivelle patch and spironolactone   Last AEX:  09-11-15  Next AEX: 08-26-17  Last MMG (if hormonal medication request): 03-25-16 WNL Refill authorized: please advise

## 2017-08-09 MED FILL — ALPRAZolam 0.5 MG TABS: 0.5 | 45 days supply | Qty: 45 | Fill #2

## 2017-08-09 MED FILL — TOPIRAMATE ER 150 MG CAP: 150 | 30 days supply | Qty: 30 | Fill #5

## 2017-08-12 MED FILL — RIZATRIPTAN 10 MG TABLET: 10 | 30 days supply | Qty: 10 | Fill #1

## 2017-08-26 ENCOUNTER — Encounter: Payer: Self-pay | Admitting: Obstetrics & Gynecology

## 2017-08-26 ENCOUNTER — Ambulatory Visit (INDEPENDENT_AMBULATORY_CARE_PROVIDER_SITE_OTHER): Payer: 59 | Admitting: Obstetrics & Gynecology

## 2017-08-26 ENCOUNTER — Other Ambulatory Visit (HOSPITAL_COMMUNITY)
Admission: RE | Admit: 2017-08-26 | Discharge: 2017-08-26 | Disposition: A | Discharge status: Home / Self Care | Payer: 59 | Source: Ambulatory Visit | Attending: Obstetrics & Gynecology | Admitting: Obstetrics & Gynecology

## 2017-08-26 VITALS — BP 136/80 | HR 100 | Resp 16 | Ht 66.75 in | Wt 243.0 lb

## 2017-08-26 DIAGNOSIS — Z8542 Personal history of malignant neoplasm of other parts of uterus: Secondary | ICD-10-CM | POA: Insufficient documentation

## 2017-08-26 DIAGNOSIS — Z0000 Encounter for general adult medical examination without abnormal findings: Secondary | ICD-10-CM | POA: Diagnosis not present

## 2017-08-26 DIAGNOSIS — Z9884 Bariatric surgery status: Secondary | ICD-10-CM

## 2017-08-26 DIAGNOSIS — Z1211 Encounter for screening for malignant neoplasm of colon: Secondary | ICD-10-CM

## 2017-08-26 DIAGNOSIS — Z01419 Encounter for gynecological examination (general) (routine) without abnormal findings: Secondary | ICD-10-CM

## 2017-08-26 DIAGNOSIS — Z124 Encounter for screening for malignant neoplasm of cervix: Secondary | ICD-10-CM

## 2017-08-26 DIAGNOSIS — Z205 Contact with and (suspected) exposure to viral hepatitis: Secondary | ICD-10-CM | POA: Diagnosis not present

## 2017-08-26 MED ORDER — ESTRADIOL 0.1 MG/24HR TD PTTW: 1.0000 | MEDICATED_PATCH | TRANSDERMAL | 4 refills | Status: DC

## 2017-08-26 MED ORDER — SPIRONOLACTONE 100 MG PO TABS: 100.0000 mg | ORAL_TABLET | Freq: Two times a day (BID) | ORAL | 4 refills | Status: DC

## 2017-08-26 MED ORDER — DESVENLAFAXINE SUCCINATE ER 100 MG PO TB24: 100.0000 mg | ORAL_TABLET | Freq: Every day | ORAL | 4 refills | Status: DC

## 2017-08-26 MED ORDER — ALPRAZOLAM 0.5 MG PO TABS: ORAL_TABLET | ORAL | 3 refills | Status: DC

## 2017-08-26 NOTE — Progress Notes (Deleted)
51 y.o. G3P2 MarriedCaucasianF here for annual exam.    Patient's last menstrual period was 09/18/2009 (approximate).          Sexually active: Yes.    The current method of family planning is status post hysterectomy.    Exercising: No.  The patient does not participate in regular exercise at present. Smoker:  no  Health Maintenance: Pap:  06/04/16 Neg.   09/11/15 Neg  History of abnormal Pap:  yes MMG:  03/25/16 BIIRADS1:Neg  Colonoscopy:  2012 BMD:   2011 TDaP:  05/2010 Screening Labs: tomorrow    reports that  has never smoked. she has never used smokeless tobacco. She reports that she does not drink alcohol or use drugs.  Past Medical History:  Diagnosis Date  . Endometrioid adenocarcinoma    Stage IA grade 1  . Eye pressure    elevated  . Infertility, female    clomid and IUI x 3  . Menorrhagia   . Migraines    migraine headaches  . Morbid obesity ([place])   . Rapid heartbeat   . Varicose veins   . Vertigo     Past Surgical History:  Procedure Laterality Date  . CESAREAN SECTION     x2  . DIAGNOSTIC LAPAROSCOPY     for infertility that was negative  . HERNIA REPAIR  3/12  . LAPAROSCOPIC CHOLECYSTECTOMY  1994  . LAPAROSCOPIC RADICAL TOTAL HYSTERECTOMY W/ NODE BIOPSY  12/10   with BSO  . NASAL SINUS SURGERY    . 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 TONSILLECTOMY     x 3    Current Outpatient Medications  Medication Sig Dispense Refill  . ALPRAZolam (XANAX) 0.5 MG tablet Take one-half to one tablet as needed for sleep or anxiety. 45 tablet 3  . buPROPion (WELLBUTRIN XL) 300 MG 24 hr tablet TAKE 1 TABLET BY MOUTH DAILY. 90 tablet 4  . desvenlafaxine (PRISTIQ) 100 MG 24 hr tablet Take 100 mg by mouth daily.    Marland Kitchen estradiol (VIVELLE-DOT) 0.1 MG/24HR patch PLACE 1 PATCH 0NTO THE SKIN 2 TIMES A WEEK. 24 patch 0  . Eszopiclone 3 MG TABS Take 1 tablet (3 mg total) by mouth at bedtime. Take immediately before bedtime 90 tablet 1  . meclizine (ANTIVERT) 25 MG tablet Take  1-2 tabs every 4-6 hours as needed for vertigo    . Multiple Vitamins-Minerals (MULTIVITAMIN PO) Take 1 tablet by mouth daily.    . promethazine (PHENERGAN) 12.5 MG tablet 1-2 tablets po q 4 hours prn nausea 20 tablet 0  . rizatriptan (MAXALT) 10 MG tablet Take 1 tablet earliest onset of migraine.  May repeat once in 2 hours if needed 10 tablet 2  . spironolactone (ALDACTONE) 100 MG tablet TAKE 1 TABLET BY MOUTH TWICE DAILY 180 tablet 0  . Topiramate ER (QUDEXY XR) 150 MG CS24 Take 150 mg by mouth daily. 30 each 5   No current facility-administered medications for this visit.     Family History  Problem Relation Age of Onset  . Coronary artery disease Mother   . Heart disease Mother   . Colon cancer Father   . Coronary artery disease Father   . Diabetes Father   . Cancer Father        lung  . Heart disease Father   . Prostate cancer Paternal Grandfather   . Cancer Paternal Grandfather        prostate  . Cancer Maternal Uncle  throat  . Lung cancer Paternal Grandmother   . Cancer Maternal Grandmother        lung    ROS:  Pertinent items are noted in HPI.  Otherwise, a comprehensive ROS was negative.  Exam:   BP 136/80 (BP Location: Right Arm, Patient Position: Sitting, Cuff Size: Large)   Pulse 100   Resp 16   Ht 5' 6.75" (1.695 m)   Wt 243 lb (110.2 kg)   LMP 09/18/2009 (Approximate)   BMI 38.34 kg/m   Weight change: @WEIGHTCHANGE @ Height:   Height: 5' 6.75" (169.5 cm)  Ht Readings from Last 3 Encounters:  08/26/17 5' 6.75" (1.695 m)  02/02/17 5\' 7"  (1.702 m)  09/11/15 5\' 7"  (1.702 m)    General appearance: alert, cooperative and appears stated age Head: Normocephalic, without obvious abnormality, atraumatic Neck: no adenopathy, supple, symmetrical, trachea midline and thyroid {EXAM; THYROID:18604} Lungs: clear to auscultation bilaterally Breasts: {Exam; breast:13139::"normal appearance, no masses or tenderness"} Heart: regular rate and rhythm Abdomen: soft,  non-tender; bowel sounds normal; no masses,  no organomegaly Extremities: extremities normal, atraumatic, no cyanosis or edema Skin: Skin color, texture, turgor normal. No rashes or lesions Lymph nodes: Cervical, supraclavicular, and axillary nodes normal. No abnormal inguinal nodes palpated Neurologic: Grossly normal   Pelvic: External genitalia:  no lesions              Urethra:  normal appearing urethra with no masses, tenderness or lesions              Bartholins and Skenes: normal                 Vagina: normal appearing vagina with normal color and discharge, no lesions              Cervix: {exam; cervix:14595}              Pap taken: {yes no:314532} Bimanual Exam:  Uterus:  {exam; uterus:12215}              Adnexa: {exam; adnexa:12223}               Rectovaginal: Confirms               Anus:  normal sphincter tone, no lesions  Chaperone was present for exam.  A:  Well Woman with normal exam  P:   {plan; gyn:5269::"mammogram","pap smear","return annually or prn"}

## 2017-08-26 NOTE — Progress Notes (Signed)
51 y.o. G3P2 Married Caucasian F here for annual exam.  Does have some bleeding still with intercourse.  This feels like it is just external.  She can put a tissue up against this area and is always in the same spot.  Denies vaginal discharge.    Headaches are under much better control since seeing Dr. Tomi Likens.  Doesn't take maxalt much at all.  Topiramate has really helped.   Patient's last menstrual period was 09/18/2009 (approximate).          Sexually active: Yes.    The current method of family planning is status post hysterectomy.    Exercising: Yes.     Smoker:  no  Health Maintenance: Pap:  Will be obtained today History of abnormal Pap:  no MMG:  03/25/16 Colonoscopy:  Pt aware this is due BMD:   Will schedule with mammogram TDaP:  06/17/10 Pneumonia vaccine(s):  Not indicated Zostavax:   Discussed with pt Hep C testing: will be obtained with lab work Screening Labs: will be obtained   reports that  has never smoked. she has never used smokeless tobacco. She reports that she does not drink alcohol or use drugs.  Past Medical History:  Diagnosis Date  . Endometrioid adenocarcinoma    Stage IA grade 1  . Eye pressure    elevated  . Infertility, female    clomid and IUI x 3  . Menorrhagia   . Migraines    migraine headaches  . Morbid obesity ([place])   . Rapid heartbeat   . Varicose veins   . Vertigo     Past Surgical History:  Procedure Laterality Date  . CESAREAN SECTION     x2  . DIAGNOSTIC LAPAROSCOPY     for infertility that was negative  . HERNIA REPAIR  3/12  . LAPAROSCOPIC CHOLECYSTECTOMY  1994  . LAPAROSCOPIC RADICAL TOTAL HYSTERECTOMY W/ NODE BIOPSY  12/10   with BSO  . NASAL SINUS SURGERY    . 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 TONSILLECTOMY     x 3    Current Outpatient Medications  Medication Sig Dispense Refill  . ALPRAZolam (XANAX) 0.5 MG tablet Take one-half to one tablet as needed for sleep or anxiety. 45 tablet 3  . buPROPion (WELLBUTRIN XL)  300 MG 24 hr tablet TAKE 1 TABLET BY MOUTH DAILY. 90 tablet 4  . desvenlafaxine (PRISTIQ) 100 MG 24 hr tablet Take 100 mg by mouth daily.    Marland Kitchen estradiol (VIVELLE-DOT) 0.1 MG/24HR patch PLACE 1 PATCH 0NTO THE SKIN 2 TIMES A WEEK. 24 patch 0  . Eszopiclone 3 MG TABS Take 1 tablet (3 mg total) by mouth at bedtime. Take immediately before bedtime 90 tablet 1  . meclizine (ANTIVERT) 25 MG tablet Take 1-2 tabs every 4-6 hours as needed for vertigo    . Multiple Vitamins-Minerals (MULTIVITAMIN PO) Take 1 tablet by mouth daily.    . promethazine (PHENERGAN) 12.5 MG tablet 1-2 tablets po q 4 hours prn nausea 20 tablet 0  . rizatriptan (MAXALT) 10 MG tablet Take 1 tablet earliest onset of migraine.  May repeat once in 2 hours if needed 10 tablet 2  . spironolactone (ALDACTONE) 100 MG tablet TAKE 1 TABLET BY MOUTH TWICE DAILY 180 tablet 0  . Topiramate ER (QUDEXY XR) 150 MG CS24 Take 150 mg by mouth daily. 30 each 5   No current facility-administered medications for this visit.     Family History  Problem Relation Age of Onset  .  Coronary artery disease Mother   . Heart disease Mother   . Colon cancer Father   . Coronary artery disease Father   . Diabetes Father   . Cancer Father        lung  . Heart disease Father   . Prostate cancer Paternal Grandfather   . Cancer Paternal Grandfather        prostate  . Cancer Maternal Uncle        throat  . Lung cancer Paternal Grandmother   . Cancer Maternal Grandmother        lung    ROS:  Pertinent items are noted in HPI.  Otherwise, a comprehensive ROS was negative.  Exam:   BP 136/80 (BP Location: Right Arm, Patient Position: Sitting, Cuff Size: Large)   Pulse 100   Resp 16   Ht 5' 6.75" (1.695 m)   Wt 243 lb (110.2 kg)   LMP 09/18/2009 (Approximate)   BMI 38.34 kg/m   Weight change: +22#    Height: 5' 6.75" (169.5 cm)  Ht Readings from Last 3 Encounters:  08/26/17 5' 6.75" (1.695 m)  02/02/17 5\' 7"  (1.702 m)  09/11/15 5\' 7"  (1.702 m)     General appearance: alert, cooperative and appears stated age Head: Normocephalic, without obvious abnormality, atraumatic Neck: no adenopathy, supple, symmetrical, trachea midline and thyroid normal to inspection and palpation Lungs: clear to auscultation bilaterally Breasts: normal appearance, no masses or tenderness Heart: regular rate and rhythm Abdomen: soft, non-tender; bowel sounds normal; no masses,  no organomegaly Extremities: extremities normal, atraumatic, no cyanosis or edema Skin: Skin color, texture, turgor normal. No rashes or lesions Lymph nodes: Cervical, supraclavicular, and axillary nodes normal. No abnormal inguinal nodes palpated Neurologic: Grossly normal   Pelvic: External genitalia:  no lesions but area underneath clitoris (where pt states bleeding occurs with intercourse) is hypopigmented with slight thickening of tissue              Urethra:  normal appearing urethra with no masses, tenderness or lesions              Bartholins and Skenes: normal                 Vagina: normal appearing vagina with normal color and discharge, no lesions              Cervix: absent              Pap taken: No. Bimanual Exam:  Uterus:  uterus absent              Adnexa: no mass, fullness, tenderness               Rectovaginal: Confirms               Anus:  normal sphincter tone, no lesions  Chaperone was present for exam.  A:  Well Woman with normal exam H/O gastric bypass.  Up 70 pounds from lowest after surgery. Osteopenia H/O Stage 1A, grade 1 endometrial adenocarcinoma Family hx of colon cancer in father, last colonoscopy 2012 Migraines Vulvar itching, hypopigmentation changes, possible lichen sclerosus H/O mildly elevated PTH 12/14 with two normal follow up values since  P:   Mammogram guidelines reviewed.  Pt is doing yearly.  BMD will be scheduled with the next MMG. Referral made for colononscopy pap smear obtained today.  Pt is aware that this is no longer  part of the follow up for endometrial cancer but she desires for reassurance  Trial of topical clobetasol ointment 0.05% nightly for the next two to three weeks to see if this helps Lab work orders placed:  Vit d, Vit B6 and B12, TSH, Lipids, Hep C antibody, folate, ferritin, CMP, CBC.  She will return for these. Does not needs Wellbutrin XL 300mg  daily RFs Xanax 0.5mg  daily prn.  Rx faxed to pharmacy today.  Pt does not use medication daily but needs RF as last rx is expired. Continue Vivelle dot 0.1mg  patches twice weekly.  #24/4RF return annually or prn

## 2017-08-27 ENCOUNTER — Other Ambulatory Visit: Payer: Self-pay | Admitting: Obstetrics & Gynecology

## 2017-08-27 ENCOUNTER — Other Ambulatory Visit (INDEPENDENT_AMBULATORY_CARE_PROVIDER_SITE_OTHER): Payer: 59

## 2017-08-27 DIAGNOSIS — Z0000 Encounter for general adult medical examination without abnormal findings: Secondary | ICD-10-CM | POA: Diagnosis not present

## 2017-08-27 DIAGNOSIS — Z9884 Bariatric surgery status: Secondary | ICD-10-CM | POA: Diagnosis not present

## 2017-08-27 DIAGNOSIS — Z205 Contact with and (suspected) exposure to viral hepatitis: Secondary | ICD-10-CM

## 2017-08-31 LAB — COMPREHENSIVE METABOLIC PANEL
A/G RATIO: 2 (ref 1.2–2.2)
ALT: 17 IU/L (ref 0–32)
AST: 25 IU/L (ref 0–40)
Albumin: 4.3 g/dL (ref 3.5–5.5)
Alkaline Phosphatase: 72 IU/L (ref 39–117)
BILIRUBIN TOTAL: 0.2 mg/dL (ref 0.0–1.2)
BUN / CREAT RATIO: 14 (ref 9–23)
BUN: 13 mg/dL (ref 6–24)
CO2: 22 mmol/L (ref 20–29)
Calcium: 9 mg/dL (ref 8.7–10.2)
Chloride: 110 mmol/L — ABNORMAL HIGH (ref 96–106)
Creatinine, Ser: 0.91 mg/dL (ref 0.57–1.00)
GFR calc Af Amer: 84 mL/min/{1.73_m2} (ref >59)
GFR, EST NON AFRICAN AMERICAN: 73 mL/min/{1.73_m2} (ref >59)
Globulin, Total: 2.2 g/dL (ref 1.5–4.5)
Glucose: 93 mg/dL (ref 65–99)
POTASSIUM: 4.2 mmol/L (ref 3.5–5.2)
SODIUM: 140 mmol/L (ref 134–144)
Total Protein: 6.5 g/dL (ref 6.0–8.5)

## 2017-08-31 LAB — LIPID PANEL
CHOL/HDL RATIO: 2.9 ratio (ref 0.0–4.4)
Cholesterol, Total: 184 mg/dL (ref 100–199)
HDL: 63 mg/dL (ref >39)
LDL Calculated: 107 mg/dL — ABNORMAL HIGH (ref 0–99)
TRIGLYCERIDES: 70 mg/dL (ref 0–149)
VLDL Cholesterol Cal: 14 mg/dL (ref 5–40)

## 2017-08-31 LAB — FOLATE: Folate: 3.8 ng/mL (ref >3.0)

## 2017-08-31 LAB — CBC
Hematocrit: 34.1 % (ref 34.0–46.6)
Hemoglobin: 11 g/dL — ABNORMAL LOW (ref 11.1–15.9)
MCH: 25.3 pg — ABNORMAL LOW (ref 26.6–33.0)
MCHC: 32.3 g/dL (ref 31.5–35.7)
MCV: 79 fL (ref 79–97)
PLATELETS: 252 10*3/uL (ref 150–379)
RBC: 4.34 x10E6/uL (ref 3.77–5.28)
RDW: 15.9 % — ABNORMAL HIGH (ref 12.3–15.4)
WBC: 3.2 10*3/uL — ABNORMAL LOW (ref 3.4–10.8)

## 2017-08-31 LAB — CYTOLOGY - PAP: Diagnosis: NEGATIVE

## 2017-08-31 LAB — HEPATITIS C ANTIBODY: Hep C Virus Ab: <0.1 s/co ratio (ref 0.0–0.9)

## 2017-08-31 LAB — FERRITIN: Ferritin: 5 ng/mL — ABNORMAL LOW (ref 15–150)

## 2017-08-31 LAB — VITAMIN D 25 HYDROXY (VIT D DEFICIENCY, FRACTURES): VIT D 25 HYDROXY: 22 ng/mL — AB (ref 30.0–100.0)

## 2017-08-31 LAB — VITAMIN B12: Vitamin B-12: 241 pg/mL (ref 232–1245)

## 2017-08-31 LAB — VITAMIN B6: VITAMIN B6: 5.6 ug/L (ref 2.0–32.8)

## 2017-08-31 LAB — TSH: TSH: 2.12 u[IU]/mL (ref 0.450–4.500)

## 2017-09-07 ENCOUNTER — Other Ambulatory Visit: Payer: Self-pay | Admitting: Obstetrics & Gynecology

## 2017-09-07 DIAGNOSIS — D508 Other iron deficiency anemias: Secondary | ICD-10-CM

## 2017-09-07 MED ORDER — TOPIRAMATE ER 150 MG PO SPRINKLE CAP24: 150.0000 mg | EXTENDED_RELEASE_CAPSULE | Freq: Every day | ORAL | 3 refills | Status: DC

## 2017-09-07 MED FILL — TOPIRAMATE ER 150 MG CAP: 150 | 90 days supply | Qty: 90 | Fill #0

## 2017-09-15 ENCOUNTER — Telehealth: Payer: Self-pay

## 2017-09-15 NOTE — Telephone Encounter (Signed)
Patient is requesting a refill for Eszopiclone 3 mg daily at bedtime. Last rx was given on 03/19/2017 #90 1RF. Last aex 08/26/2017. Routing to [place] for review.

## 2017-09-17 ENCOUNTER — Other Ambulatory Visit: Payer: Self-pay | Admitting: Obstetrics & Gynecology

## 2017-09-17 MED ORDER — ESZOPICLONE 3 MG PO TABS: 3.0000 mg | ORAL_TABLET | Freq: Every day | ORAL | 1 refills | Status: DC

## 2017-09-17 MED FILL — ESZOPICLONE 3 MG TABS: 3 | 90 days supply | Qty: 90 | Fill #0

## 2017-09-17 NOTE — Telephone Encounter (Signed)
RX done and printed.  Will sign and it can be faxed.  Ok to close encounter and notify pt.

## 2017-09-21 ENCOUNTER — Other Ambulatory Visit: Payer: Self-pay | Admitting: Family

## 2017-09-21 MED FILL — ALPRAZolam 0.5 MG TABS: 0.5 | 45 days supply | Qty: 45 | Fill #0

## 2017-09-22 ENCOUNTER — Encounter: Payer: Self-pay | Admitting: Family

## 2017-09-22 ENCOUNTER — Other Ambulatory Visit: Payer: Self-pay

## 2017-09-22 ENCOUNTER — Other Ambulatory Visit: Payer: 59

## 2017-09-22 ENCOUNTER — Ambulatory Visit (INDEPENDENT_AMBULATORY_CARE_PROVIDER_SITE_OTHER): Payer: 59 | Admitting: Obstetrics and Gynecology

## 2017-09-22 ENCOUNTER — Encounter: Payer: Self-pay | Admitting: Obstetrics and Gynecology

## 2017-09-22 ENCOUNTER — Ambulatory Visit (HOSPITAL_BASED_OUTPATIENT_CLINIC_OR_DEPARTMENT_OTHER): Payer: 59 | Admitting: Family

## 2017-09-22 VITALS — BP 132/63 | HR 70 | Temp 97.8°F | Resp 20 | Ht 67.0 in | Wt 242.4 lb

## 2017-09-22 VITALS — BP 124/80 | HR 72 | Temp 97.8°F

## 2017-09-22 DIAGNOSIS — D508 Other iron deficiency anemias: Secondary | ICD-10-CM

## 2017-09-22 DIAGNOSIS — R350 Frequency of micturition: Secondary | ICD-10-CM | POA: Diagnosis not present

## 2017-09-22 DIAGNOSIS — K909 Intestinal malabsorption, unspecified: Secondary | ICD-10-CM | POA: Insufficient documentation

## 2017-09-22 DIAGNOSIS — D510 Vitamin B12 deficiency anemia due to intrinsic factor deficiency: Secondary | ICD-10-CM | POA: Diagnosis not present

## 2017-09-22 DIAGNOSIS — Z8542 Personal history of malignant neoplasm of other parts of uterus: Secondary | ICD-10-CM

## 2017-09-22 DIAGNOSIS — E559 Vitamin D deficiency, unspecified: Secondary | ICD-10-CM

## 2017-09-22 DIAGNOSIS — R822 Biliuria: Secondary | ICD-10-CM | POA: Diagnosis not present

## 2017-09-22 DIAGNOSIS — R102 Pelvic and perineal pain: Secondary | ICD-10-CM

## 2017-09-22 DIAGNOSIS — R3989 Other symptoms and signs involving the genitourinary system: Secondary | ICD-10-CM | POA: Diagnosis not present

## 2017-09-22 DIAGNOSIS — D509 Iron deficiency anemia, unspecified: Secondary | ICD-10-CM | POA: Insufficient documentation

## 2017-09-22 HISTORY — DX: Other iron deficiency anemias: D50.8

## 2017-09-22 HISTORY — DX: Iron deficiency anemia, unspecified: D50.9

## 2017-09-22 LAB — POCT URINALYSIS DIPSTICK
BILIRUBIN UA: NEGATIVE
GLUCOSE UA: NEGATIVE
NITRITE UA: NEGATIVE
Protein, UA: NEGATIVE
Urobilinogen, UA: 4 E.U./dL — AB
pH, UA: 7.5 (ref 5.0–8.0)

## 2017-09-22 MED ORDER — B-12 2500 MCG SL SUBL: 2500.0000 ug | SUBLINGUAL_TABLET | Freq: Every day | SUBLINGUAL | 11 refills | Status: DC

## 2017-09-22 MED ORDER — SULFAMETHOXAZOLE-TRIMETHOPRIM 800-160 MG PO TABS: 1.0000 | ORAL_TABLET | Freq: Two times a day (BID) | ORAL | 0 refills | Status: DC

## 2017-09-22 MED ORDER — ERGOCALCIFEROL 1.25 MG (50000 UT) PO CAPS: 50000.0000 [IU] | ORAL_CAPSULE | ORAL | 6 refills | Status: DC

## 2017-09-22 MED ORDER — PHENAZOPYRIDINE HCL 200 MG PO TABS: 200.0000 mg | ORAL_TABLET | Freq: Three times a day (TID) | ORAL | 0 refills | Status: DC | PRN

## 2017-09-22 MED FILL — VIT D2 1.25 MG (50,000 UNIT: 1.25 MG | 56 days supply | Qty: 8 | Fill #0

## 2017-09-22 MED FILL — PHENAZOPYRIDINE 200 MG TAB: 200 | 2 days supply | Qty: 6 | Fill #0

## 2017-09-22 MED FILL — SULFAMETHOXAZOLE-TMP DS TAB: 800-160 | 3 days supply | Qty: 6 | Fill #0

## 2017-09-22 NOTE — Progress Notes (Addendum)
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 She has developed malabsorption with this and is now iron deficiency. She also has low vitamin D and B 12.  She is symptomatic at this time with fatigue, SOB with over exertion, chills, dizziness and brain fog.  She has had no episodes of bleeding or petechiae. She does bruise easily but not in excess.  She states that she fell down the stairs at her home recently after miss stepping. She has some healing bruises on her right elbow and is still a little sore.  She started at around 360 lbs and initially lost 200 lbs after surgery. She has gained back 80 lbs since then. She would like to get back to the 200 lb weight which is where she felt her best.  She has history of endometrial cancer due to too much estrogen prior to gastric bypass. She had a total hysterectomy in December 2010  and has had no other issues.  She also had the Moh's procedure to remove a basal cell carcinoma from the left side of her nose.  Family history of cancer includes: father - colorectal and maternal grandmother - lung (smoker).  She has 2 children which she states are healthy. No history of miscarriage.  No fever, n/v, cough, rash, chest pain, palpitations, abdominal pain or changes in bowel habits.  She has some IBS off and on but is able to control this for the most part with her diet.  She woke up this morning with pressure and burning on urination. She plans to have a UA with gynecology later today to assess for UTI.   She states that  she started having her colonoscopy early and so far they have been negative.  No lymphadenopathy found on exam.  She does not smoke or drink alcohol.  She plans to schedule her mammogram and colonoscopy once things calm down at work.  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   ROS: All other 10 point review of systems is negative.   PAST MEDICAL HISTORY:   Past Medical History:  Diagnosis Date  . Endometrioid adenocarcinoma    Stage IA grade 1  . Eye pressure    elevated  . Infertility, female    clomid and IUI x 3  . Menorrhagia   . Migraines    migraine headaches  . Morbid obesity ([place])   . Rapid heartbeat   . Varicose veins   . Vertigo     ALLERGIES: No Known Allergies    MEDICATIONS:  Current Outpatient Medications on File Prior to Visit  Medication Sig Dispense Refill  . ALPRAZolam (XANAX) 0.5 MG tablet Take one-half to one tablet as needed for sleep or anxiety. 45 tablet 3  . buPROPion (WELLBUTRIN XL) 300 MG 24 hr tablet TAKE 1 TABLET BY MOUTH DAILY. 90 tablet 4  . desvenlafaxine (PRISTIQ) 100 MG 24 hr tablet Take 1 tablet (100 mg total) daily by mouth. 90 tablet 4  . estradiol (VIVELLE-DOT) 0.1 MG/24HR  patch Place 1 patch (0.1 mg total) 2 (two) times a week onto the skin. 24 patch 4  . Eszopiclone 3 MG TABS Take 1 tablet (3 mg total) by mouth at bedtime. Take immediately before bedtime 90 tablet 1  . meclizine (ANTIVERT) 25 MG tablet Take 1-2 tabs every 4-6 hours as needed for vertigo    . Multiple Vitamins-Minerals (MULTIVITAMIN PO) Take 1 tablet by mouth daily.    . promethazine (PHENERGAN) 12.5 MG tablet 1-2 tablets po q 4 hours prn nausea 20 tablet 0  . rizatriptan (MAXALT) 10 MG tablet Take 1 tablet earliest onset of migraine.  May repeat once in 2 hours if needed 10 tablet 2  . spironolactone (ALDACTONE) 100 MG tablet Take 1 tablet (100 mg total) 2 (two) times daily by mouth. 180 tablet 4  . Topiramate ER (QUDEXY XR) 150 MG  CS24 sprinkle capsule Take 150 mg by mouth daily. 90 each 3   No current facility-administered medications on file prior to visit.      PAST SURGICAL HISTORY Past Surgical History:  Procedure Laterality Date  . CESAREAN SECTION     x2  . DIAGNOSTIC LAPAROSCOPY     for infertility that was negative  . HERNIA REPAIR  3/12  . LAPAROSCOPIC CHOLECYSTECTOMY  1994  . LAPAROSCOPIC RADICAL TOTAL HYSTERECTOMY W/ NODE BIOPSY  12/10   with BSO  . NASAL SINUS SURGERY    . 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 TONSILLECTOMY     x 3    FAMILY HISTORY: Family History  Problem Relation Age of Onset  . Coronary artery disease Mother   . Heart disease Mother   . Colon cancer Father   . Coronary artery disease Father   . Diabetes Father   . Cancer Father        lung  . Heart disease Father   . Prostate cancer Paternal Grandfather   . Cancer Paternal Grandfather        prostate  . Cancer Maternal Uncle        throat  . Lung cancer Paternal Grandmother   . Cancer Maternal Grandmother        lung    SOCIAL HISTORY:  reports that  has never smoked. she has never used smokeless tobacco. She reports that she does not drink alcohol or use drugs.  PERFORMANCE STATUS: The patient's performance status is 1 - Symptomatic but completely ambulatory  PHYSICAL EXAM: Most Recent Vital Signs: Last menstrual period 09/18/2009. LMP 09/18/2009 (Approximate)   General Appearance:    Alert, cooperative, no distress, appears stated age  Head:    Normocephalic, without obvious abnormality, atraumatic  Eyes:    PERRL, conjunctiva/corneas clear, EOM's intact, fundi    benign, both eyes        Throat:   Lips, mucosa, and tongue normal; teeth and gums normal  Neck:   Supple, symmetrical, trachea midline, no adenopathy;    thyroid:  no enlargement/tenderness/nodules; no carotid   bruit or JVD  Back:     Symmetric, no curvature, ROM normal, no CVA tenderness  Lungs:     Clear to auscultation bilaterally,  respirations unlabored  Chest Wall:    No tenderness or deformity   Heart:    Regular rate and rhythm, S1 and S2 normal, no murmur, rub   or gallop     Abdomen:     Soft, non-tender, bowel sounds active all four quadrants,    no masses, no organomegaly  Extremities:   Extremities normal, atraumatic, no cyanosis or edema  Pulses:   2+ and symmetric all extremities  Skin:   Skin color, texture, turgor normal, no rashes or lesions  Lymph nodes:   Cervical, supraclavicular, and axillary nodes normal  Neurologic:   CNII-XII intact, normal strength, sensation and reflexes    throughout    LABORATORY DATA:  No results found for this or any previous visit (from the past 48 hour(s)).    RADIOGRAPHY: No results found.     PATHOLOGY: None   ASSESSMENT/PLAN: Judith Holland is a very pleasant 51 yo caucasian female with iron deficiency anemia and both vitamin D and B 12 deficiency secondary to malabsorption after gastric bypass in 2011. She is symptomatic with fatigue, SOB with over exertion, chills, dizziness and brain fog.  Ferritin in November was 5 and Hgb 11.0. Vitamin D is 22 and B 12 241.  We will give her 2 doses of IV iron within the next week or so. She will also start taking Vit D 50,000 units once a week and vitamin B 12 2,500 mcg PO sub lingual daily.  We will plan to see her back again in another 6 weeks for repeat lab work and follow-up.   All questions were answered and she is in agreement with the plan. She will contact our office with any questions or concerns. We can certainly see her much sooner if necessary.  She was discussed with and also seen by Dr. Marin Olp and he is in agreement with the aforementioned.   [facility]     Addendum: I saw and examined the patient with Judith Holland.  It is no surprise that she is very nice.  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  She clearly is iron deficient.  I looked at her blood under the  microscope.  Her blood smear showed microcytic and hypochromic red blood cells.  She had some poikilocytosis and anisocytosis.  Given that she had her gastric bypass several years ago, it is no surprise that she is iron deficient.  Her ferritin a month ago was only 5.  I also worried that she may develop B12 deficiency.  A month ago, her vitamin B12 level was 241.  We spent about 45 minutes with her.  We reviewed her labs.  She is obviously quite knowledgeable given that she works in a [employer].  She just wants to feel better.  I know that with IV iron, we will replete her iron stores and she will feel better.  I would like to see her back in about 6 weeks.  By then, we should see a nice improvement in her hematologic parameters.  Lattie Haw, MD

## 2017-09-22 NOTE — Progress Notes (Signed)
GYNECOLOGY  VISIT   HPI: 51 y.o.   Married  Caucasian  female   G3P2 with Patient's last menstrual period was 09/18/2009 (approximate).   here for c/o urethral pressure X 3 days. Initially the pressure was intermittent, now constant and uncomfortable. An hour ago she had trouble walking, she was is significant pain.  Not having pain with urination, feels better to void. Some frequency of urination. Not the same type of urgency she has had before with a UTI. Thought she might have a fever. Some lower abdominal discomfort, but thinks it's from a fall over the weekend. No vaginal bleeding.   GYNECOLOGIC HISTORY: Patient's last menstrual period was 09/18/2009 (approximate). Contraception:hysterectomy  Menopausal hormone therapy: none         OB History    Gravida Para Term Preterm AB Living   3 2       2    SAB TAB Ectopic Multiple Live Births                     Patient Active Problem List   Diagnosis Date Noted  . IDA (iron deficiency anemia) 09/22/2017  . Malabsorption of iron 09/22/2017  . Stress incontinence 09/11/2015  . Unsteady gait 04/16/2014  . Vertigo   . 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 Endometrial adenocarcinoma ([place]) 02/10/2012  . Allergic rhinitis 10/02/2007    Past Medical History:  Diagnosis Date  . Endometrioid adenocarcinoma    Stage IA grade 1  . Eye pressure    elevated  . Infertility, female    clomid and IUI x 3  . Menorrhagia   . Migraines    migraine headaches  . Morbid obesity ([place])   . Rapid heartbeat   . Varicose veins   . Vertigo     Past Surgical History:  Procedure Laterality Date  . CESAREAN SECTION     x2  . DIAGNOSTIC LAPAROSCOPY     for infertility that was negative  . HERNIA REPAIR  3/12  . LAPAROSCOPIC CHOLECYSTECTOMY  1994  . LAPAROSCOPIC RADICAL TOTAL HYSTERECTOMY W/ NODE BIOPSY  12/10   with BSO  . NASAL SINUS SURGERY    . 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 TONSILLECTOMY     x 3    Current  Outpatient Medications  Medication Sig Dispense Refill  . ALPRAZolam (XANAX) 0.5 MG tablet Take one-half to one tablet as needed for sleep or anxiety. 45 tablet 3  . buPROPion (WELLBUTRIN XL) 300 MG 24 hr tablet TAKE 1 TABLET BY MOUTH DAILY. 90 tablet 4  . Cyanocobalamin (B-12) 2500 MCG SUBL Place 2,500 mcg under the tongue daily. 30 tablet 11  . desvenlafaxine (PRISTIQ) 100 MG 24 hr tablet Take 1 tablet (100 mg total) daily by mouth. 90 tablet 4  . ergocalciferol (VITAMIN D2) 50000 units capsule Take 1 capsule (50,000 Units total) by mouth once a week. 8 capsule 6  . estradiol (VIVELLE-DOT) 0.1 MG/24HR patch Place 1 patch (0.1 mg total) 2 (two) times a week onto the skin. 24 patch 4  . Eszopiclone 3 MG TABS Take 1 tablet (3 mg total) by mouth at bedtime. Take immediately before bedtime 90 tablet 1  . meclizine (ANTIVERT) 25 MG tablet Take 1-2 tabs every 4-6 hours as needed for vertigo    . Multiple Vitamins-Minerals (MULTIVITAMIN PO) Take 1 tablet by mouth 2 (two) times a week.     . promethazine (PHENERGAN) 12.5 MG tablet 1-2 tablets  po q 4 hours prn nausea 20 tablet 0  . rizatriptan (MAXALT) 10 MG tablet Take 1 tablet earliest onset of migraine.  May repeat once in 2 hours if needed 10 tablet 2  . spironolactone (ALDACTONE) 100 MG tablet Take 1 tablet (100 mg total) 2 (two) times daily by mouth. (Patient taking differently: Take 100 mg by mouth daily. ) 180 tablet 4  . Topiramate ER (QUDEXY XR) 150 MG CS24 sprinkle capsule Take 150 mg by mouth daily. 90 each 3   No current facility-administered medications for this visit.      ALLERGIES: Patient has no known allergies.  Family History  Problem Relation Age of Onset  . Coronary artery disease Mother   . Heart disease Mother   . Colon cancer Father   . Coronary artery disease Father   . Diabetes Father   . Cancer Father        lung  . Heart disease Father   . Prostate cancer Paternal Grandfather   . Cancer Paternal Grandfather         prostate  . Cancer Maternal Uncle        throat  . Lung cancer Paternal Grandmother   . Cancer Maternal Grandmother        lung    Social History   Socioeconomic History  . Marital status: Married    Spouse name: Merry Proud  . Number of children: 2  . Years of education: college  . Highest education level: Not on file  Social Needs  . Financial resource strain: Not on file  . Food insecurity - worry: Not on file  . Food insecurity - inability: Not on file  . Transportation needs - medical: Not on file  . Transportation needs - non-medical: Not on file  Occupational History    Comment: [employer]  Tobacco Use  . Smoking status: Never Smoker  . Smokeless tobacco: Never Used  Substance and Sexual Activity  . Alcohol use: No    Alcohol/week: 0.0 oz  . Drug use: No  . Sexual activity: Yes    Partners: Male    Birth control/protection: Surgical    Comment: Hyst  Other Topics Concern  . Not on file  Social History Narrative   Patient lives at home with her husband Merry Proud). Patient has two children and works full time for [employer].   Caffeine patient drinks a very mild amount not daily.    Review of Systems  Constitutional: Negative.   HENT: Negative.   Eyes: Negative.   Respiratory: Negative.   Cardiovascular: Negative.   Gastrointestinal: Negative.   Genitourinary:       Urethral pressure  Musculoskeletal: Negative.   Skin: Negative.   Neurological: Negative.   Endo/Heme/Allergies: Negative.   Psychiatric/Behavioral: Negative.     PHYSICAL EXAMINATION:    BP 124/80 (BP Location: Left Arm, Patient Position: Sitting, Cuff Size: Large)   Pulse 72   Temp 97.8 F (36.6 C)   LMP 09/18/2009 (Approximate)     General appearance: alert, cooperative and appears stated age Abdomen: soft, non-tender; non distended, no masses,  no organomegaly CVA: not tender  Pelvic: External genitalia:  no lesions              Urethra:  normal  appearing urethra with no masses, tenderness or lesions, no erythema              Bartholins and Skenes: normal  Vagina: normal appearing vagina with normal color and discharge, no lesions              Cervix: absent              Bimanual Exam:  Uterus:  uterus absent              Adnexa: no mass, fullness, tenderness              Bladder: tender to palpation   ASSESSMENT Possible UTI, symptoms are atypical.  Pain at the opening of the urethra    PLAN Urine for ua, c&s Treat with Bactrim DS and Pyridium Affirm Hydrate well   An After Visit Summary was printed and given to the patient.  Addendum: urine dip with trace blood, 2+ leuk and elevated urobilinogen. This can happen with hemolysis or abnormal hepatic clearance The patient is being evaluated by hematology for iron def anemia.  Recent normal CMP Will recheck her urine tomorrow, if still + will check liver panel, retic count, LDH, and haptaglobin Patient is aware  CC: Dr Sabra Heck, Laverna Peace, NP

## 2017-09-23 ENCOUNTER — Other Ambulatory Visit (INDEPENDENT_AMBULATORY_CARE_PROVIDER_SITE_OTHER): Payer: 59

## 2017-09-23 ENCOUNTER — Other Ambulatory Visit: Payer: Self-pay | Admitting: Obstetrics and Gynecology

## 2017-09-23 DIAGNOSIS — R350 Frequency of micturition: Secondary | ICD-10-CM

## 2017-09-23 DIAGNOSIS — R822 Biliuria: Secondary | ICD-10-CM

## 2017-09-23 LAB — POCT URINALYSIS DIPSTICK
Blood, UA: NEGATIVE
KETONES UA: NEGATIVE
Leukocytes, UA: NEGATIVE
Protein, UA: NEGATIVE
Urobilinogen, UA: 4 E.U./dL — AB
pH, UA: 8 (ref 5.0–8.0)

## 2017-09-23 LAB — URINALYSIS, MICROSCOPIC ONLY: CASTS: NONE SEEN /LPF

## 2017-09-23 NOTE — Addendum Note (Signed)
Addended by: Susanne Greenhouse E on: 09/23/2017 04:41 PM   Modules accepted: Orders

## 2017-09-24 ENCOUNTER — Telehealth: Payer: Self-pay | Admitting: *Deleted

## 2017-09-24 ENCOUNTER — Ambulatory Visit (HOSPITAL_BASED_OUTPATIENT_CLINIC_OR_DEPARTMENT_OTHER): Payer: 59

## 2017-09-24 ENCOUNTER — Other Ambulatory Visit: Payer: Self-pay | Admitting: Obstetrics and Gynecology

## 2017-09-24 VITALS — BP 121/55 | HR 63 | Temp 98.5°F | Resp 18

## 2017-09-24 DIAGNOSIS — K909 Intestinal malabsorption, unspecified: Secondary | ICD-10-CM

## 2017-09-24 DIAGNOSIS — D508 Other iron deficiency anemias: Secondary | ICD-10-CM | POA: Diagnosis not present

## 2017-09-24 LAB — HEPATIC FUNCTION PANEL
ALT: 16 IU/L (ref 0–32)
AST: 26 IU/L (ref 0–40)
Albumin: 4.6 g/dL (ref 3.5–5.5)
Alkaline Phosphatase: 76 IU/L (ref 39–117)
BILIRUBIN TOTAL: 0.3 mg/dL (ref 0.0–1.2)
BILIRUBIN, DIRECT: 0.1 mg/dL (ref 0.00–0.40)
TOTAL PROTEIN: 6.9 g/dL (ref 6.0–8.5)

## 2017-09-24 LAB — HAPTOGLOBIN: HAPTOGLOBIN: 92 mg/dL (ref 34–200)

## 2017-09-24 LAB — RETICULOCYTES: RETIC CT PCT: 1.1 % (ref 0.6–2.6)

## 2017-09-24 LAB — VAGINITIS/VAGINOSIS, DNA PROBE
CANDIDA SPECIES: POSITIVE — AB
Gardnerella vaginalis: NEGATIVE
TRICHOMONAS VAG: NEGATIVE

## 2017-09-24 LAB — LACTATE DEHYDROGENASE: LDH: 195 IU/L (ref 119–226)

## 2017-09-24 MED ORDER — DIPHENHYDRAMINE HCL 50 MG/ML IJ SOLN
25.0000 mg | Freq: Once | INTRAMUSCULAR | Status: AC
  Administered 2017-09-24: 25 mg via INTRAVENOUS

## 2017-09-24 MED ORDER — SODIUM CHLORIDE 0.9 % IV SOLN
510.0000 mg | Freq: Once | INTRAVENOUS | Status: AC
  Administered 2017-09-24: 510 mg via INTRAVENOUS
  Filled 2017-09-24: qty 17

## 2017-09-24 MED ORDER — SODIUM CHLORIDE 0.9 % IV SOLN
Freq: Once | INTRAVENOUS | Status: AC
  Administered 2017-09-24: 15:00:00 via INTRAVENOUS

## 2017-09-24 MED ORDER — METHYLPREDNISOLONE SODIUM SUCC 125 MG IJ SOLR
60.0000 mg | Freq: Once | INTRAMUSCULAR | Status: AC
  Administered 2017-09-24: 60 mg via INTRAVENOUS

## 2017-09-24 MED ORDER — PHENAZOPYRIDINE HCL 200 MG PO TABS: 200.0000 mg | ORAL_TABLET | Freq: Three times a day (TID) | ORAL | 0 refills | Status: DC | PRN

## 2017-09-24 MED ORDER — FLUCONAZOLE 150 MG PO TABS: 150.0000 mg | ORAL_TABLET | Freq: Once | ORAL | 0 refills | Status: AC

## 2017-09-24 MED ORDER — NITROFURANTOIN MONOHYD MACRO 100 MG PO CAPS: 100.0000 mg | ORAL_CAPSULE | Freq: Two times a day (BID) | ORAL | 0 refills | Status: DC

## 2017-09-24 NOTE — Telephone Encounter (Signed)
I spoke with the patient and her medication was changed.

## 2017-09-24 NOTE — Patient Instructions (Addendum)
  YOU CAN TAKE 25MG  OF BENADRYL AGAIN TONIGHT OR ONE PEPCID TABLET TONIGHT AND SHOULD REPEAT ONE DOSE TOMORROW ALSO AS A HYPERSENSITIVITY PROTOCOL.   Ferumoxytol injection What is this medicine? FERUMOXYTOL is an iron complex. Iron is used to make healthy red blood cells, which carry oxygen and nutrients throughout the body. This medicine is used to treat iron deficiency anemia in people with chronic kidney disease. This medicine may be used for other purposes; ask your health care provider or pharmacist if you have questions. COMMON BRAND NAME(S): Feraheme What should I tell my health care provider before I take this medicine? They need to know if you have any of these conditions: -anemia not caused by low iron levels -high levels of iron in the blood -magnetic resonance imaging (MRI) test scheduled -an unusual or allergic reaction to iron, other medicines, foods, dyes, or preservatives -pregnant or trying to get pregnant -breast-feeding How should I use this medicine? This medicine is for injection into a vein. It is given by a health care professional in a hospital or clinic setting. Talk to your pediatrician regarding the use of this medicine in children. Special care may be needed. Overdosage: If you think you have taken too much of this medicine contact a poison control center or emergency room at once. NOTE: This medicine is only for you. Do not share this medicine with others. What if I miss a dose? It is important not to miss your dose. Call your doctor or health care professional if you are unable to keep an appointment. What may interact with this medicine? This medicine may interact with the following medications: -other iron products This list may not describe all possible interactions. Give your health care provider a list of all the medicines, herbs, non-prescription drugs, or dietary supplements you use. Also tell them if you smoke, drink alcohol, or use illegal drugs. Some  items may interact with your medicine. What should I watch for while using this medicine? Visit your doctor or healthcare professional regularly. Tell your doctor or healthcare professional if your symptoms do not start to get better or if they get worse. You may need blood work done while you are taking this medicine. You may need to follow a special diet. Talk to your doctor. Foods that contain iron include: whole grains/cereals, dried fruits, beans, or peas, leafy green vegetables, and organ meats (liver, kidney). What side effects may I notice from receiving this medicine? Side effects that you should report to your doctor or health care professional as soon as possible: -allergic reactions like skin rash, itching or hives, swelling of the face, lips, or tongue -breathing problems -changes in blood pressure -feeling faint or lightheaded, falls -fever or chills -flushing, sweating, or hot feelings -swelling of the ankles or feet Side effects that usually do not require medical attention (report to your doctor or health care professional if they continue or are bothersome): -diarrhea -headache -nausea, vomiting -stomach pain This list may not describe all possible side effects. Call your doctor for medical advice about side effects. You may report side effects to FDA at 1-800-FDA-1088. Where should I keep my medicine? This drug is given in a hospital or clinic and will not be stored at home. NOTE: This sheet is a summary. It may not cover all possible information. If you have questions about this medicine, talk to your doctor, pharmacist, or health care provider.  2018 Elsevier/Gold Standard (2015-11-07 12:41:49)

## 2017-09-24 NOTE — Progress Notes (Signed)
Just as patient finished her Feraheme and was ready for observation, she was coughing and nurse asked if she was feeling ok. She stated "my lips feel weird, not my tongue but my lips feel weird". Glynna NP made aware and assessed patient who remained alert and oriented. See Mar for meds given. Patient reported feeling better as soon as the second medicine was in.

## 2017-09-24 NOTE — Telephone Encounter (Signed)
Called to check on patient. Patient is still not feeling well. Her urine culture is not back yet. She is concerned that she may not be on the right ABX. Please advise -eh

## 2017-09-25 LAB — URINE CULTURE

## 2017-10-01 ENCOUNTER — Ambulatory Visit (HOSPITAL_BASED_OUTPATIENT_CLINIC_OR_DEPARTMENT_OTHER): Payer: 59

## 2017-10-01 VITALS — BP 128/64 | HR 58 | Temp 97.7°F | Resp 20

## 2017-10-01 DIAGNOSIS — K909 Intestinal malabsorption, unspecified: Secondary | ICD-10-CM

## 2017-10-01 DIAGNOSIS — D508 Other iron deficiency anemias: Secondary | ICD-10-CM | POA: Diagnosis not present

## 2017-10-01 MED ORDER — METHYLPREDNISOLONE SODIUM SUCC 125 MG IJ SOLR
60.0000 mg | Freq: Once | INTRAMUSCULAR | Status: AC
Start: 2017-10-01 — End: 2017-10-01
  Administered 2017-10-01: 60 mg via INTRAVENOUS

## 2017-10-01 MED ORDER — SODIUM CHLORIDE 0.9 % IV SOLN
510.0000 mg | Freq: Once | INTRAVENOUS | Status: AC
  Administered 2017-10-01: 510 mg via INTRAVENOUS
  Filled 2017-10-01: qty 17

## 2017-10-01 MED ORDER — SODIUM CHLORIDE 0.9 % IV SOLN
Freq: Once | INTRAVENOUS | Status: AC
  Administered 2017-10-01: 14:00:00 via INTRAVENOUS

## 2017-10-01 MED ORDER — METHYLPREDNISOLONE SODIUM SUCC 125 MG IJ SOLR
INTRAMUSCULAR | Status: AC
  Filled 2017-10-01: qty 2

## 2017-10-01 MED ORDER — FAMOTIDINE IN NACL 20-0.9 MG/50ML-% IV SOLN
INTRAVENOUS | Status: AC
  Filled 2017-10-01: qty 100

## 2017-10-01 MED ORDER — FAMOTIDINE IN NACL 20-0.9 MG/50ML-% IV SOLN
40.0000 mg | Freq: Once | INTRAVENOUS | Status: AC
  Administered 2017-10-01: 40 mg via INTRAVENOUS

## 2017-10-01 NOTE — Patient Instructions (Signed)

## 2017-10-01 NOTE — Progress Notes (Signed)
1518-At end of Feraheme, pt complaining of "slight tingling to lips.  Jory Ee NP notified and order received to give pt additional Solumedrol 60 mg IV now.   1530-Tingling to lips has ceased and pt is without complaints at this time.   Pt without complaints at time of discharge.

## 2017-10-04 ENCOUNTER — Other Ambulatory Visit: Payer: Self-pay | Admitting: Obstetrics and Gynecology

## 2017-10-04 DIAGNOSIS — R822 Biliuria: Secondary | ICD-10-CM

## 2017-10-13 DIAGNOSIS — R3915 Urgency of urination: Secondary | ICD-10-CM | POA: Diagnosis not present

## 2017-10-13 DIAGNOSIS — J0101 Acute recurrent maxillary sinusitis: Secondary | ICD-10-CM | POA: Diagnosis not present

## 2017-10-13 DIAGNOSIS — R05 Cough: Secondary | ICD-10-CM | POA: Diagnosis not present

## 2017-10-13 MED FILL — BENZONATATE 100 MG CAP: 100 | 10 days supply | Qty: 30 | Fill #0

## 2017-10-13 MED FILL — AMOX TR-K CLV 875-125 MG TA: 875-125 | 10 days supply | Qty: 20 | Fill #0

## 2017-10-13 MED FILL — HYDROCODONE-HOMATROPINE SYR: 5-1.5 | 12 days supply | Qty: 120 | Fill #0

## 2017-10-13 MED FILL — FLUTICASONE PROP 50 MCG SPR: 50 | 60 days supply | Qty: 16 | Fill #0

## 2017-10-27 ENCOUNTER — Telehealth: Payer: Self-pay

## 2017-10-27 MED FILL — ESTRADIOL 0.1 MG PATCH: 0.1 | 84 days supply | Qty: 24 | Fill #0

## 2017-10-27 NOTE — Telephone Encounter (Signed)
Spoke with patient. Patient has a history of UTI's within the last few months. Patient is going to be traveling out of town and is worried about possible infection. Requesting rx to take with her for Macrobid, Pyridium, and Diflucan in case this is needed while away. Also requesting order for BMD to have this done with mammogram. Order to [place] for review.

## 2017-10-29 ENCOUNTER — Other Ambulatory Visit: Payer: Self-pay | Admitting: Obstetrics & Gynecology

## 2017-10-29 MED ORDER — PHENAZOPYRIDINE HCL 97.2 MG PO TABS: 97.0000 mg | ORAL_TABLET | Freq: Three times a day (TID) | ORAL | 0 refills | Status: DC | PRN

## 2017-10-29 MED ORDER — FLUCONAZOLE 150 MG PO TABS: 150.0000 mg | ORAL_TABLET | Freq: Once | ORAL | 0 refills | Status: AC

## 2017-10-29 MED ORDER — NITROFURANTOIN MONOHYD MACRO 100 MG PO CAPS: 100.0000 mg | ORAL_CAPSULE | Freq: Two times a day (BID) | ORAL | 0 refills | Status: DC

## 2017-10-29 NOTE — Telephone Encounter (Signed)
Rf's have been completed.

## 2017-11-01 MED FILL — FLUCONAZOLE 150 MG TABLET: 150 | 3 days supply | Qty: 2 | Fill #0

## 2017-11-01 MED FILL — NITROFURANTOIN MONO-MCR 100: 100 | 5 days supply | Qty: 10 | Fill #0

## 2017-11-01 NOTE — Telephone Encounter (Signed)
Patient notified. Encounter closed

## 2017-11-03 ENCOUNTER — Inpatient Hospital Stay: Payer: 59 | Attending: Family | Admitting: Family

## 2017-11-03 ENCOUNTER — Encounter: Payer: Self-pay | Admitting: Family

## 2017-11-03 ENCOUNTER — Other Ambulatory Visit: Payer: Self-pay

## 2017-11-03 ENCOUNTER — Inpatient Hospital Stay: Payer: 59

## 2017-11-03 VITALS — BP 111/48 | HR 82 | Temp 98.2°F | Resp 19 | Wt 228.1 lb

## 2017-11-03 DIAGNOSIS — D510 Vitamin B12 deficiency anemia due to intrinsic factor deficiency: Secondary | ICD-10-CM

## 2017-11-03 DIAGNOSIS — D508 Other iron deficiency anemias: Secondary | ICD-10-CM

## 2017-11-03 DIAGNOSIS — E538 Deficiency of other specified B group vitamins: Secondary | ICD-10-CM | POA: Insufficient documentation

## 2017-11-03 DIAGNOSIS — E559 Vitamin D deficiency, unspecified: Secondary | ICD-10-CM

## 2017-11-03 DIAGNOSIS — Z9884 Bariatric surgery status: Secondary | ICD-10-CM | POA: Insufficient documentation

## 2017-11-03 DIAGNOSIS — K912 Postsurgical malabsorption, not elsewhere classified: Secondary | ICD-10-CM | POA: Diagnosis not present

## 2017-11-03 DIAGNOSIS — K909 Intestinal malabsorption, unspecified: Secondary | ICD-10-CM

## 2017-11-03 LAB — CBC WITH DIFFERENTIAL (CANCER CENTER ONLY)
BASOS ABS: 0 10*3/uL (ref 0.0–0.1)
BASOS PCT: 0 %
EOS ABS: 0 10*3/uL (ref 0.0–0.5)
Eosinophils Relative: 1 %
HCT: 42.1 % (ref 34.8–46.6)
Hemoglobin: 14 g/dL (ref 11.6–15.9)
Lymphocytes Relative: 27 %
Lymphs Abs: 0.9 10*3/uL (ref 0.9–3.3)
MCH: 29.4 pg (ref 26.0–34.0)
MCHC: 33.3 g/dL (ref 32.0–36.0)
MCV: 88.4 fL (ref 81.0–101.0)
MONO ABS: 0.4 10*3/uL (ref 0.1–0.9)
MONOS PCT: 11 %
NEUTROS ABS: 2 10*3/uL (ref 1.5–6.5)
Neutrophils Relative %: 61 %
Platelet Count: 189 10*3/uL (ref 145–400)
RBC: 4.76 MIL/uL (ref 3.70–5.32)
RDW: 19.7 % — AB (ref 11.1–15.7)
WBC Count: 3.3 10*3/uL — ABNORMAL LOW (ref 3.9–10.3)

## 2017-11-03 LAB — IRON AND TIBC
IRON: 111 ug/dL (ref 41–142)
SATURATION RATIOS: 31 % (ref 21–57)
TIBC: 363 ug/dL (ref 236–444)
UIBC: 251 ug/dL

## 2017-11-03 LAB — VITAMIN B12: VITAMIN B 12: 840 pg/mL (ref 180–914)

## 2017-11-03 LAB — FERRITIN: FERRITIN: 43 ng/mL (ref 9–269)

## 2017-11-03 NOTE — Progress Notes (Signed)
Hematology and Oncology Follow Up Visit  Judith Holland [id number] 1965/10/22 52 y.o. 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 She is still feeling fatigued, having cold chills, headaches/dizziness and SOB with over exertion. She has also continued to have brain fog.  Her balance has been off with the dizziness and she has had several falls at home.  She has history of migraines and responded to Topiramate ER. She is followed by Neurology Judith Holland but had to cancel her last follow-up due to her work schedule.  She verbalized that she is taking her vitamin D and B 12 supplements as prescribed.  No fever, n/v, cough, rash, chest pain, palpitations, abdominal pain or changes in bowel or bladder habits.  No swelling, tenderness, numbness or tingling in her extremities. No c/o pain at this time.  She has a good appetite and is staying well hydrated. Her weight is down 14 lbs since we last saw her.  She is excited for her anniversary trip with her husband to [place] in 3 weeks!  ECOG Performance Status: 1 - Symptomatic but completely ambulatory  Medications:  Allergies as of 11/03/2017   No Known Allergies     Medication List        Accurate as of 11/03/17  9:10 AM. Always use your most recent med list.          ALPRAZolam 0.5 MG tablet Commonly known as:  XANAX Take one-half to one tablet as needed for sleep or anxiety.   B-12 2500 MCG Subl Place 2,500 mcg under the tongue daily.   buPROPion 300 MG 24 hr tablet Commonly known as:  WELLBUTRIN XL TAKE 1 TABLET BY MOUTH DAILY.   desvenlafaxine 100 MG 24 hr tablet Commonly known as:  PRISTIQ Take 1 tablet (100  mg total) daily by mouth.   ergocalciferol 50000 units capsule Commonly known as:  VITAMIN D2 Take 1 capsule (50,000 Units total) by mouth once a week.   estradiol 0.1 MG/24HR patch Commonly known as:  VIVELLE-DOT Place 1 patch (0.1 mg total) 2 (two) times a week onto the skin.   Eszopiclone 3 MG Tabs Take 1 tablet (3 mg total) by mouth at bedtime. Take immediately before bedtime   meclizine 25 MG tablet Commonly known as:  ANTIVERT Take 1-2 tabs every 4-6 hours as needed for vertigo   MULTIVITAMIN PO Take 1 tablet by mouth 2 (two) times a week.   nitrofurantoin (macrocrystal-monohydrate) 100 MG capsule Commonly known as:  MACROBID Take 1 capsule (100 mg total) by mouth 2 (two) times daily. Take one capsule BID x 5 days   phenazopyridine 200 MG tablet Commonly known as:  PYRIDIUM Take 1 tablet (200 mg total) by mouth 3 (three) times daily as needed.   phenazopyridine 97 MG tablet Commonly known as:  PYRIDIUM Take 1 tablet (97 mg total) by mouth 3 (three) times daily as needed for pain.   promethazine 12.5 MG tablet Commonly known as:  PHENERGAN 1-2 tablets po q 4 hours prn nausea   rizatriptan 10 MG tablet Commonly known as:  MAXALT Take 1 tablet earliest onset of migraine.  May repeat once in  2 hours if needed   spironolactone 100 MG tablet Commonly known as:  ALDACTONE Take 1 tablet (100 mg total) 2 (two) times daily by mouth.   Topiramate ER 150 MG Cs24 sprinkle capsule Commonly known as:  QUDEXY XR Take 150 mg by mouth daily.       Allergies: No Known Allergies  Past Medical History, Surgical history, Social history, and Family History were reviewed and updated.  Review of Systems: All other 10 point review of systems is negative.   Physical Exam:  weight is 228 lb 1.9 oz (103.5 kg). Her oral temperature is 98.2 F (36.8 C). Her blood pressure is 111/48 (abnormal) and her pulse is 82. Her respiration is 19 and oxygen saturation is 100%.   Wt Readings  from Last 3 Encounters:  11/03/17 228 lb 1.9 oz (103.5 kg)  09/22/17 242 lb 6.4 oz (110 kg)  08/26/17 243 lb (110.2 kg)    Ocular: Sclerae unicteric, pupils equal, round and reactive to light Ear-nose-throat: Oropharynx clear, dentition fair Lymphatic: No cervical, supraclavicular or axillary adenopathy Lungs no rales or rhonchi, good excursion bilaterally Heart regular rate and rhythm, no murmur appreciated Abd soft, nontender, positive bowel sounds, no liver or spleen tip palpated on exam, no fluid wave  MSK no focal spinal tenderness, no joint edema Neuro: non-focal, well-oriented, appropriate affect Breasts: Deferred   Lab Results  Component Value Date   WBC 3.2 (L) 08/27/2017   HGB 11.0 (L) 08/27/2017   HCT 42.1 11/03/2017   MCV 88.4 11/03/2017   PLT 252 08/27/2017   Lab Results  Component Value Date   FERRITIN 5 (L) 08/27/2017   Lab Results  Component Value Date   RETICCTPCT 1.1 09/23/2017   RBC 4.76 11/03/2017   No results found for: Nils Pyle [facility] Lab Results  Component Value Date   IGGSERUM 846 05/02/2014   IGA 218 05/02/2014   IGMSERUM 112 05/02/2014   Lab Results  Component Value Date   TOTALPROTELP 6.7 05/02/2014   ALBUMINELP 61.3 05/02/2014   A1GS 3.8 05/02/2014   A2GS 9.6 05/02/2014   BETS 7.9 (H) 05/02/2014   BETA2SER 4.8 05/02/2014   GAMS 12.6 05/02/2014   MSPIKE NOT DET 05/02/2014   SPEI SEE NOTE 05/02/2014     Chemistry      Component Value Date/Time   NA 140 08/27/2017 0858   K 4.2 08/27/2017 0858   CL 110 (H) 08/27/2017 0858   CO2 22 08/27/2017 0858   BUN 13 08/27/2017 0858   CREATININE 0.91 08/27/2017 0858   CREATININE 0.95 10/17/2014 1708      Component Value Date/Time   CALCIUM 9.0 08/27/2017 0858   ALKPHOS 76 09/23/2017 1644   AST 26 09/23/2017 1644   ALT 16 09/23/2017 1644   BILITOT 0.3 09/23/2017 1644      Impression and Plan: [name] is a very pleasant 52 yo caucasian female with iron, Vit  B-12 and vit D deficiency secondary to malabsorption after gastric bypass in 2011. She is still quite symptomatic, as mentioned above, after 2 doses of Feraheme. She did have some flushing and itching with her last dose and was given Pepcid and solumedrol. If she requires IV iron again we will premedicate her with benadryl, solumedrol and Pepcid to prevent another reaction. She is in agreement with this.  We will see what her lab work shows and bring her back in later this week for an infusion if needed.  If B-12 remains low we will consider injections  as the next step.  We really want her feeling her best for her anniversary trip! We will go ahead and plan to see her back in another 6 weeks for repeat lab work and follow-up.  She will contact our office with any questions or concerns. We can certainly see him sooner if need be.    Laverna Peace, NP 1/16/20199:10 AM

## 2017-11-04 LAB — VITAMIN D 25 HYDROXY (VIT D DEFICIENCY, FRACTURES): VIT D 25 HYDROXY: 23.7 ng/mL — AB (ref 30.0–100.0)

## 2017-11-10 ENCOUNTER — Other Ambulatory Visit: Payer: Self-pay | Admitting: Family

## 2017-11-10 MED ORDER — ERGOCALCIFEROL 1.25 MG (50000 UT) PO CAPS: 50000.0000 [IU] | ORAL_CAPSULE | ORAL | 6 refills | Status: DC

## 2017-11-10 MED FILL — VIT D2 1.25 MG (50,000 UNIT: 1.25 MG | 56 days supply | Qty: 16 | Fill #0

## 2017-11-12 ENCOUNTER — Telehealth: Payer: Self-pay | Admitting: *Deleted

## 2017-11-12 NOTE — Telephone Encounter (Addendum)
Patient is aware of results and new prescription.   ----- Message from Eliezer Bottom, NP sent at 11/10/2017 11:06 AM EST ----- Iron and B 12 look great but Vit D still low. Need to increase vit D supplement to twice a week please. Thank you!   Karole  ----- Message ----- From: Buel Ream, [facility] Sent: 11/03/2017   8:42 AM To: Eliezer Bottom, NP

## 2017-11-15 MED FILL — BUPROPION HCL XL 300 MG TAB: 300 | 90 days supply | Qty: 90 | Fill #3

## 2017-11-15 MED FILL — DESVENLAFAXINE SUC ER 100 M: 100 | 90 days supply | Qty: 90 | Fill #3

## 2017-11-15 MED FILL — ALPRAZolam 0.5 MG TABS: 0.5 | 45 days supply | Qty: 45 | Fill #1

## 2017-11-21 ENCOUNTER — Other Ambulatory Visit: Payer: Self-pay | Admitting: Obstetrics & Gynecology

## 2017-11-21 MED ORDER — AMOXICILLIN-POT CLAVULANATE 875-125 MG PO TABS: 1.0000 | ORAL_TABLET | Freq: Two times a day (BID) | ORAL | 0 refills | Status: DC

## 2017-11-21 NOTE — Progress Notes (Signed)
Rx for Augmentin sent electronically to pharmacy for pt.

## 2017-12-08 ENCOUNTER — Encounter: Payer: Self-pay | Admitting: Obstetrics & Gynecology

## 2017-12-16 MED FILL — TOPIRAMATE ER 150 MG CAP: 150 | 90 days supply | Qty: 90 | Fill #1

## 2017-12-16 MED FILL — ESZOPICLONE 3 MG TABS: 3 | 90 days supply | Qty: 90 | Fill #1

## 2017-12-20 ENCOUNTER — Inpatient Hospital Stay: Payer: 59 | Attending: Family | Admitting: Family

## 2017-12-20 ENCOUNTER — Inpatient Hospital Stay: Payer: 59

## 2018-01-10 ENCOUNTER — Encounter: Payer: Self-pay | Admitting: Obstetrics & Gynecology

## 2018-01-11 ENCOUNTER — Encounter: Payer: Self-pay | Admitting: Obstetrics & Gynecology

## 2018-01-19 DIAGNOSIS — Z1231 Encounter for screening mammogram for malignant neoplasm of breast: Secondary | ICD-10-CM | POA: Diagnosis not present

## 2018-01-21 DIAGNOSIS — R928 Other abnormal and inconclusive findings on diagnostic imaging of breast: Secondary | ICD-10-CM | POA: Diagnosis not present

## 2018-02-01 MED FILL — ESTRADIOL 0.1 MG PATCH: 0.1 | 84 days supply | Qty: 24 | Fill #1

## 2018-02-01 MED FILL — ALPRAZolam 0.5 MG TABS: 0.5 | 45 days supply | Qty: 45 | Fill #2

## 2018-02-08 DIAGNOSIS — Z800 Family history of malignant neoplasm of digestive organs: Secondary | ICD-10-CM | POA: Diagnosis not present

## 2018-02-08 DIAGNOSIS — R635 Abnormal weight gain: Secondary | ICD-10-CM | POA: Diagnosis not present

## 2018-02-08 DIAGNOSIS — D509 Iron deficiency anemia, unspecified: Secondary | ICD-10-CM | POA: Diagnosis not present

## 2018-02-08 DIAGNOSIS — Z1211 Encounter for screening for malignant neoplasm of colon: Secondary | ICD-10-CM | POA: Diagnosis not present

## 2018-02-25 ENCOUNTER — Other Ambulatory Visit: Payer: Self-pay | Admitting: Obstetrics & Gynecology

## 2018-02-25 MED ORDER — DESVENLAFAXINE SUCCINATE ER 100 MG PO TB24: 100.0000 mg | ORAL_TABLET | Freq: Every day | ORAL | 4 refills | Status: DC

## 2018-02-25 MED ORDER — BUPROPION HCL ER (XL) 300 MG PO TB24
300.0000 mg | ORAL_TABLET | Freq: Every day | ORAL | 4 refills | Status: DC
Start: 2018-02-25 — End: 2019-04-14

## 2018-02-25 MED ORDER — RIZATRIPTAN BENZOATE 10 MG PO TABS: ORAL_TABLET | ORAL | 4 refills | Status: DC

## 2018-02-25 MED FILL — FLUTICASONE PROP 50 MCG SPR: 50 | 60 days supply | Qty: 16 | Fill #1

## 2018-02-25 MED FILL — DESVENLAFAXINE SUC ER 100 M: 100 | 90 days supply | Qty: 90 | Fill #0

## 2018-02-25 MED FILL — buPROPion HCL ER (XL) 300 M: 300 | 90 days supply | Qty: 90 | Fill #0

## 2018-02-25 MED FILL — RIZATRIPTAN BENZOATE 10 MG: 10 | 50 days supply | Qty: 30 | Fill #0

## 2018-02-25 MED FILL — VIT D2 1.25 MG (50,000 UNIT: 1.25 MG | 56 days supply | Qty: 16 | Fill #1

## 2018-03-01 ENCOUNTER — Ambulatory Visit: Payer: 59 | Admitting: Obstetrics & Gynecology

## 2018-03-16 ENCOUNTER — Other Ambulatory Visit: Payer: Self-pay | Admitting: Obstetrics & Gynecology

## 2018-03-16 MED FILL — PEG-3350 AND ELECTROLYTES S: 236 | 2 days supply | Qty: 4000 | Fill #0

## 2018-03-16 NOTE — Telephone Encounter (Signed)
Medication refill request: eszopiclone  Last AEX:  08-26-17 SM  Next AEX: not currently scheduled Last MMG (if hormonal medication request): n/a Refill authorized: 09-17-18 #[id number], 1RF. Today, please advise.

## 2018-03-18 DIAGNOSIS — Z1211 Encounter for screening for malignant neoplasm of colon: Secondary | ICD-10-CM | POA: Diagnosis not present

## 2018-03-18 DIAGNOSIS — Z800 Family history of malignant neoplasm of digestive organs: Secondary | ICD-10-CM | POA: Diagnosis not present

## 2018-03-28 MED FILL — ESZOPICLONE 3 MG TABLET: 3 | 90 days supply | Qty: 90 | Fill #0

## 2018-04-06 ENCOUNTER — Other Ambulatory Visit: Payer: Self-pay | Admitting: Obstetrics & Gynecology

## 2018-04-06 MED FILL — TOPIRAMATE ER 150 MG CAP: 150 | 90 days supply | Qty: 90 | Fill #2

## 2018-04-06 MED FILL — ALPRAZolam 0.5 MG TABS: 0.5 | 45 days supply | Qty: 45 | Fill #0

## 2018-04-06 NOTE — Telephone Encounter (Signed)
Medication refill request: flonase/ Xanax  Last AEX:  08-26-17  Next AEX: not yet scheduled  Last MMG (if hormonal medication request): 01-21-18 Breast U/S WNL  Refill authorized: please advise

## 2018-06-14 MED FILL — buPROPion HCL ER (XL) 300 M: 300 | 90 days supply | Qty: 90 | Fill #1

## 2018-06-14 MED FILL — ALPRAZolam 0.5 MG TABS: 0.5 | 45 days supply | Qty: 45 | Fill #1

## 2018-06-14 MED FILL — DESVENLAFAXINE SUC ER 100 M: 100 | 90 days supply | Qty: 90 | Fill #1

## 2018-06-24 MED FILL — ESTRADIOL 0.1 MG PATCH: 0.1 | 84 days supply | Qty: 24 | Fill #2

## 2018-06-24 MED FILL — ESZOPICLONE 3 MG TABS: 3 | 90 days supply | Qty: 90 | Fill #1

## 2018-06-24 MED FILL — VIT D2 1.25 MG (50,000 UNIT: 1.25 MG | 56 days supply | Qty: 16 | Fill #2

## 2018-07-18 MED FILL — TOPIRAMATE ER 150 MG CAP: 150 | 90 days supply | Qty: 90 | Fill #3

## 2018-07-19 MED FILL — SPIRONOLACTONE 100 MG TAB: 100 | 90 days supply | Qty: 180 | Fill #0

## 2018-08-08 ENCOUNTER — Ambulatory Visit (INDEPENDENT_AMBULATORY_CARE_PROVIDER_SITE_OTHER): Payer: Self-pay | Admitting: Family Medicine

## 2018-08-08 ENCOUNTER — Other Ambulatory Visit: Payer: Self-pay

## 2018-08-08 VITALS — BP 115/85 | HR 128 | Temp 98.7°F | Resp 18 | Wt 231.2 lb

## 2018-08-08 DIAGNOSIS — R05 Cough: Secondary | ICD-10-CM

## 2018-08-08 DIAGNOSIS — R059 Cough, unspecified: Secondary | ICD-10-CM

## 2018-08-08 DIAGNOSIS — J019 Acute sinusitis, unspecified: Secondary | ICD-10-CM

## 2018-08-08 MED ORDER — PSEUDOEPH-BROMPHEN-DM 30-2-10 MG/5ML PO SYRP: 10.0000 mL | ORAL_SOLUTION | Freq: Three times a day (TID) | ORAL | 0 refills | Status: DC | PRN

## 2018-08-08 MED ORDER — AMOXICILLIN-POT CLAVULANATE 875-125 MG PO TABS: 1.0000 | ORAL_TABLET | Freq: Two times a day (BID) | ORAL | 0 refills | Status: DC

## 2018-08-08 NOTE — Patient Instructions (Addendum)
Sinusitis, Adult  PLAN< Increase hydration Work note x 72 hours Take tylenol 650 mg every 6-8 hours    Sinusitis is soreness and inflammation of your sinuses. Sinuses are hollow spaces in the bones around your face. Your sinuses are located:  Around your eyes.  In the middle of your forehead.  Behind your nose.  In your cheekbones.  Your sinuses and nasal passages are lined with a stringy fluid (mucus). Mucus normally drains out of your sinuses. When your nasal tissues become inflamed or swollen, the mucus can become trapped or blocked so air cannot flow through your sinuses. This allows bacteria, viruses, and funguses to grow, which leads to infection. Sinusitis can develop quickly and last for 7?10 days (acute) or for more than 12 weeks (chronic). Sinusitis often develops after a cold. What are the causes? This condition is caused by anything that creates swelling in the sinuses or stops mucus from draining, including:  Allergies.  Asthma.  Bacterial or viral infection.  Abnormally shaped bones between the nasal passages.  Nasal growths that contain mucus (nasal polyps).  Narrow sinus openings.  Pollutants, such as chemicals or irritants in the air.  A foreign object stuck in the nose.  A fungal infection. This is rare.  What increases the risk? The following factors may make you more likely to develop this condition:  Having allergies or asthma.  Having had a recent cold or respiratory tract infection.  Having structural deformities or blockages in your nose or sinuses.  Having a weak immune system.  Doing a lot of swimming or diving.  Overusing nasal sprays.  Smoking.  What are the signs or symptoms? The main symptoms of this condition are pain and a feeling of pressure around the affected sinuses. Other symptoms include:  Upper toothache.  Earache.  Headache.  Bad breath.  Decreased sense of smell and taste.  A cough that may get worse at  night.  Fatigue.  Fever.  Thick drainage from your nose. The drainage is often green and it may contain pus (purulent).  Stuffy nose or congestion.  Postnasal drip. This is when extra mucus collects in the throat or back of the nose.  Swelling and warmth over the affected sinuses.  Sore throat.  Sensitivity to light.  How is this diagnosed? This condition is diagnosed based on symptoms, a medical history, and a physical exam. To find out if your condition is acute or chronic, your health care provider may:  Look in your nose for signs of nasal polyps.  Tap over the affected sinus to check for signs of infection.  View the inside of your sinuses using an imaging device that has a light attached (endoscope).  If your health care provider suspects that you have chronic sinusitis, you may also:  Be tested for allergies.  Have a sample of mucus taken from your nose (nasal culture) and checked for bacteria.  Have a mucus sample examined to see if your sinusitis is related to an allergy.  If your sinusitis does not respond to treatment and it lasts longer than 8 weeks, you may have an MRI or CT scan to check your sinuses. These scans also help to determine how severe your infection is. In rare cases, a bone biopsy may be done to rule out more serious types of fungal sinus disease. How is this treated? Treatment for sinusitis depends on the cause and whether your condition is chronic or acute. If a virus is causing your sinusitis, your  symptoms will go away on their own within 10 days. You may be given medicines to relieve your symptoms, including:  Topical nasal decongestants. They shrink swollen nasal passages and let mucus drain from your sinuses.  Antihistamines. These drugs block inflammation that is triggered by allergies. This can help to ease swelling in your nose and sinuses.  Topical nasal corticosteroids. These are nasal sprays that ease inflammation and swelling in  your nose and sinuses.  Nasal saline washes. These rinses can help to get rid of thick mucus in your nose.  If your condition is caused by bacteria, you will be given an antibiotic medicine. If your condition is caused by a fungus, you will be given an antifungal medicine. Surgery may be needed to correct underlying conditions, such as narrow nasal passages. Surgery may also be needed to remove polyps. Follow these instructions at home: Medicines  Take, use, or apply over-the-counter and prescription medicines only as told by your health care provider. These may include nasal sprays.  If you were prescribed an antibiotic medicine, take it as told by your health care provider. Do not stop taking the antibiotic even if you start to feel better. Hydrate and Humidify  Drink enough water to keep your urine clear or pale yellow. Staying hydrated will help to thin your mucus.  Use a cool mist humidifier to keep the humidity level in your home above 50%.  Inhale steam for 10-15 minutes, 3-4 times a day or as told by your health care provider. You can do this in the bathroom while a hot shower is running.  Limit your exposure to cool or dry air. Rest  Rest as much as possible.  Sleep with your head raised (elevated).  Make sure to get enough sleep each night. General instructions  Apply a warm, moist washcloth to your face 3-4 times a day or as told by your health care provider. This will help with discomfort.  Wash your hands often with soap and water to reduce your exposure to viruses and other germs. If soap and water are not available, use hand sanitizer.  Do not smoke. Avoid being around people who are smoking (secondhand smoke).  Keep all follow-up visits as told by your health care provider. This is important. Contact a health care provider if:  You have a fever.  Your symptoms get worse.  Your symptoms do not improve within 10 days. Get help right away if:  You have a  severe headache.  You have persistent vomiting.  You have pain or swelling around your face or eyes.  You have vision problems.  You develop confusion.  Your neck is stiff.  You have trouble breathing. This information is not intended to replace advice given to you by your health care provider. Make sure you discuss any questions you have with your health care provider. Document Released: 10/05/2005 Document Revised: 05/31/2016 Document Reviewed: 07/31/2015 Elsevier Interactive Patient Education  [employer].

## 2018-08-08 NOTE — Progress Notes (Signed)
Judith Holland is a 52 y.o. female who presents today with concerns of cough, congestion, malaise and fever after international travel earlier this month. She denies known sick contacts and spouse who is present is unaffected and asymptomatic. Patient reports taking multiple over the counter medications without any improvement of symptoms.  Review of Systems  Constitutional: Positive for fever and malaise/fatigue. Negative for chills.  HENT: Positive for congestion and sinus pain. Negative for ear discharge, ear pain and sore throat.   Eyes: Negative.   Respiratory: Positive for cough and shortness of breath. Negative for sputum production.   Cardiovascular: Negative.  Negative for chest pain.  Gastrointestinal: Negative for abdominal pain, diarrhea, nausea and vomiting.  Genitourinary: Negative for dysuria, frequency, hematuria and urgency.  Musculoskeletal: Negative for myalgias.  Skin: Negative.   Neurological: Negative for headaches.  Endo/Heme/Allergies: Negative.   Psychiatric/Behavioral: Negative.     O: Vitals:   08/08/18 1912  BP: 115/85  Pulse: (!) 128  Resp: 18  Temp: 98.7 F (37.1 C)  SpO2: 98%     Physical Exam  Constitutional: She is oriented to person, place, and time. Vital signs are normal. She appears well-developed and well-nourished. She is active.  Non-toxic appearance. She does not have a sickly appearance.  HENT:  Head: Normocephalic.  Right Ear: Hearing, tympanic membrane, external ear and ear canal normal.  Left Ear: Hearing, tympanic membrane, external ear and ear canal normal.  Nose: Rhinorrhea present. Right sinus exhibits no maxillary sinus tenderness and no frontal sinus tenderness. Left sinus exhibits maxillary sinus tenderness and frontal sinus tenderness.  Mouth/Throat: Uvula is midline. Posterior oropharyngeal erythema present.  Neck: Normal range of motion. Neck supple.  Cardiovascular: Normal rate, regular rhythm, normal heart sounds and  normal pulses.  Pulmonary/Chest: Effort normal. She has rales in the right upper field, the right middle field, the right lower field, the left upper field, the left middle field and the left lower field.  Abdominal: Soft. Bowel sounds are normal.  Musculoskeletal: Normal range of motion.  Lymphadenopathy:       Head (right side): No submental and no submandibular adenopathy present.       Head (left side): No submental and no submandibular adenopathy present.    She has no cervical adenopathy.  Neurological: She is alert and oriented to person, place, and time.  Psychiatric: She has a normal mood and affect.  Vitals reviewed.   A: 1. Acute non-recurrent sinusitis, unspecified location   2. Cough    P: Discussed exam findings, diagnosis etiology and medication use and indications reviewed with patient. Follow- Up and discharge instructions provided. No emergent/urgent issues found on exam.  Patient verbalized understanding of information provided and agrees with plan of care (POC), all questions answered.  PLAN< Increase hydration Work note x 72 hours Take tylenol 650 mg every 6-8 hours   1. Acute non-recurrent sinusitis, unspecified location - amoxicillin-clavulanate (AUGMENTIN) 875-125 MG tablet; Take 1 tablet by mouth 2 (two) times daily for 7 days.  2. Cough - brompheniramine-pseudoephedrine-DM 30-2-10 MG/5ML syrup; Take 10 mLs by mouth 3 (three) times daily as needed.

## 2018-08-17 ENCOUNTER — Ambulatory Visit (INDEPENDENT_AMBULATORY_CARE_PROVIDER_SITE_OTHER): Payer: 59 | Admitting: Obstetrics and Gynecology

## 2018-08-17 ENCOUNTER — Encounter: Payer: Self-pay | Admitting: Obstetrics and Gynecology

## 2018-08-17 ENCOUNTER — Other Ambulatory Visit: Payer: Self-pay

## 2018-08-17 ENCOUNTER — Other Ambulatory Visit: Payer: Self-pay | Admitting: *Deleted

## 2018-08-17 VITALS — BP 132/88 | HR 76 | Wt 229.2 lb

## 2018-08-17 DIAGNOSIS — N760 Acute vaginitis: Secondary | ICD-10-CM | POA: Diagnosis not present

## 2018-08-17 DIAGNOSIS — N3946 Mixed incontinence: Secondary | ICD-10-CM | POA: Diagnosis not present

## 2018-08-17 MED ORDER — FLUCONAZOLE 150 MG PO TABS: 150.0000 mg | ORAL_TABLET | Freq: Once | ORAL | 0 refills | Status: DC

## 2018-08-17 MED ORDER — FLUCONAZOLE 150 MG PO TABS: 150.0000 mg | ORAL_TABLET | Freq: Once | ORAL | 0 refills | Status: AC

## 2018-08-17 MED ORDER — BETAMETHASONE VALERATE 0.1 % EX OINT: TOPICAL_OINTMENT | CUTANEOUS | 0 refills | Status: DC

## 2018-08-17 NOTE — Progress Notes (Signed)
GYNECOLOGY  VISIT   HPI: 52 y.o.   Married White or Caucasian Not Hispanic or Latino  female   G3P2 with Patient's last menstrual period was 09/18/2009 (approximate).   here for vulvitis symptoms and to discuss urinary incontinence. She has been on antibiotics for an upper respiratory infection and has developed severe vulvovaginal itching. Itching started on Sunday, she used a left over diflucan, helped a little. Worse again, tried some terazol. She c/o GSI, leaks many times a weeks with valsalva, small to large amounts. Symptoms have worsened in the last year. Occasional urge incontinence with a full bladder (small to large amounts). She currently as a URI and her incontinence is much worse.  Unless she has a URI she wears a pad intermittently. Leakage with valsalva is worse than the urge incontinence. Voids normally, no vaginal bulge.   H/O hysterectomy for endometrial cancer    GYNECOLOGIC HISTORY: Patient's last menstrual period was 09/18/2009 (approximate). Contraception:NA Menopausal hormone therapy: ERT        OB History    Gravida  3   Para  2   Term      Preterm      AB      Living  2     SAB      TAB      Ectopic      Multiple      Live Births                 Patient Active Problem List   Diagnosis Date Noted  . IDA (iron deficiency anemia) 09/22/2017  . Malabsorption of iron 09/22/2017  . Stress incontinence 09/11/2015  . Unsteady gait 04/16/2014  . Vertigo   . 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 Status post bariatric surgery 11/10/2013  . 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 Allergic rhinitis 10/02/2007    Past Medical History:  Diagnosis Date  . Endometrioid adenocarcinoma    Stage IA grade 1  . Eye pressure    elevated  . Infertility, female    clomid and IUI x 3  . Menorrhagia   . Migraines    migraine headaches  . Morbid obesity ([address])   . Rapid heartbeat   . Varicose veins   . Vertigo     Past  Surgical History:  Procedure Laterality Date  . CESAREAN SECTION     x2  . DIAGNOSTIC LAPAROSCOPY     for infertility that was negative  . HERNIA REPAIR  3/12  . LAPAROSCOPIC CHOLECYSTECTOMY  1994  . LAPAROSCOPIC RADICAL TOTAL HYSTERECTOMY W/ NODE BIOPSY  12/10   with BSO  . NASAL SINUS SURGERY    . 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 TONSILLECTOMY     x 3    Current Outpatient Medications  Medication Sig Dispense Refill  . ALPRAZolam (XANAX) 0.5 MG tablet TAKE 1/2 TO 1 TABLET BY MOUTH AS NEEDED FOR SLEEP OR ANXIETY 45 tablet 1  . buPROPion (WELLBUTRIN XL) 300 MG 24 hr tablet Take 1 tablet (300 mg total) by mouth daily. 90 tablet 4  . Cyanocobalamin (B-12) 2500 MCG SUBL Place 2,500 mcg under the tongue daily. 30 tablet 11  . desvenlafaxine (PRISTIQ) 100 MG 24 hr tablet Take 1 tablet (100 mg total) by mouth daily. 90 tablet 4  . ergocalciferol (VITAMIN D2) 50000 units capsule Take 1 capsule (50,000 Units total) by mouth 2 (two) times a week. 16 capsule 6  . estradiol (VIVELLE-DOT)  0.1 MG/24HR patch Place 1 patch (0.1 mg total) 2 (two) times a week onto the skin. 24 patch 4  . Eszopiclone 3 MG TABS TAKE 1 TABLET BY MOUTH IMMEDIATELY BEFORE BEDTIME 90 tablet 1  . fluticasone (FLONASE) 50 MCG/ACT nasal spray PLACE 2 SPRAYS INTO THE NOSE DAILY. 16 g 3  . meclizine (ANTIVERT) 25 MG tablet Take 1-2 tabs every 4-6 hours as needed for vertigo    . Multiple Vitamins-Minerals (MULTIVITAMIN PO) Take 1 tablet by mouth 2 (two) times a week.     . rizatriptan (MAXALT) 10 MG tablet Take 1 tablet earliest onset of migraine.  May repeat once in 2 hours if needed 30 tablet 4  . spironolactone (ALDACTONE) 100 MG tablet Take 1 tablet (100 mg total) 2 (two) times daily by mouth. (Patient taking differently: Take 100 mg by mouth daily. ) 180 tablet 4  . Topiramate ER (QUDEXY XR) 150 MG CS24 sprinkle capsule Take 150 mg by mouth daily. 90 each 3  . nitrofurantoin, macrocrystal-monohydrate, (MACROBID) 100 MG  capsule Take 1 capsule (100 mg total) by mouth 2 (two) times daily. Take one capsule BID x 5 days (Patient not taking: Reported on 08/08/2018) 10 capsule 0  . phenazopyridine (PYRIDIUM) 200 MG tablet Take 1 tablet (200 mg total) by mouth 3 (three) times daily as needed. (Patient not taking: Reported on 08/08/2018) 6 tablet 0  . phenazopyridine (PYRIDIUM) 97 MG tablet Take 1 tablet (97 mg total) by mouth 3 (three) times daily as needed for pain. (Patient not taking: Reported on 08/08/2018) 20 tablet 0  . promethazine (PHENERGAN) 12.5 MG tablet 1-2 tablets po q 4 hours prn nausea (Patient not taking: Reported on 08/17/2018) 20 tablet 0  . Pseudoeph-Doxylamine-DM-APAP (NYQUIL PO) Take by mouth.     No current facility-administered medications for this visit.      ALLERGIES: Patient has no known allergies.  Family History  Problem Relation Age of Onset  . Coronary artery disease Mother   . Heart disease Mother   . Colon cancer Father   . Coronary artery disease Father   . Diabetes Father   . Cancer Father        lung  . Heart disease Father   . Prostate cancer Paternal Grandfather   . Cancer Paternal Grandfather        prostate  . Cancer Maternal Uncle        throat  . Lung cancer Paternal Grandmother   . Cancer Maternal Grandmother        lung    Social History   Socioeconomic History  . Marital status: Married    Spouse name: Merry Proud  . Number of children: 2  . Years of education: college  . Highest education level: Not on file  Occupational History    Comment: [employer]  . Financial resource strain: Not on file  . Food insecurity:    Worry: Not on file    Inability: Not on file  . Transportation needs:    Medical: Not on file    Non-medical: Not on file  Tobacco Use  . Smoking status: Never Smoker  . Smokeless tobacco: Never Used  Substance and Sexual Activity  . Alcohol use: No    Alcohol/week: 0.0 standard drinks  . Drug use: No  . Sexual  activity: Yes    Partners: Male    Birth control/protection: Surgical    Comment: Hyst  Lifestyle  . Physical activity:    Days  per week: Not on file    Minutes per session: Not on file  . Stress: Not on file  Relationships  . Social connections:    Talks on phone: Not on file    Gets together: Not on file    Attends religious service: Not on file    Active member of club or organization: Not on file    Attends meetings of clubs or organizations: Not on file    Relationship status: Not on file  . Intimate partner violence:    Fear of current or ex partner: Not on file    Emotionally abused: Not on file    Physically abused: Not on file    Forced sexual activity: Not on file  Other Topics Concern  . Not on file  Social History Narrative   Patient lives at home with her husband Merry Proud). Patient has two children and works full time for [employer].   Caffeine patient drinks a very mild amount not daily.    Review of Systems  Constitutional: Negative.   HENT: Negative.   Eyes: Negative.   Respiratory: Negative.   Cardiovascular: Negative.   Gastrointestinal: Negative.   Genitourinary:       Vaginal itching Urinary leakage  Musculoskeletal: Negative.   Skin: Negative.   Neurological: Negative.   Endo/Heme/Allergies: Negative.   Psychiatric/Behavioral: Negative.     PHYSICAL EXAMINATION:    BP 132/88 (BP Location: Right Arm, Patient Position: Sitting, Cuff Size: Normal)   Pulse 76   Wt 229 lb 3.2 oz (104 kg)   LMP 09/18/2009 (Approximate)   BMI 35.90 kg/m     General appearance: alert, cooperative and appears stated age  Pelvic: External genitalia:  no lesions, mild erythema, fissure noted.               Urethra:  normal appearing urethra with no masses, tenderness or lesions              Bartholins and Skenes: normal                 Vagina: normal appearing vagina with normal color. Slight increase in watery, white vaginal discharge.  No significant prolapse noted with or without valsalva (only examined supine)              Cervix: absent              Bimanual Exam:  Uterus:  uterus absent              Adnexa: no mass, fullness, tenderness                Chaperone was present for exam.  Wet prep: ? 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  CC: Dr Sabra Heck

## 2018-08-18 LAB — URINE CULTURE: Organism ID, Bacteria: NO GROWTH

## 2018-08-18 LAB — VAGINITIS/VAGINOSIS, DNA PROBE
CANDIDA SPECIES: POSITIVE — AB
GARDNERELLA VAGINALIS: NEGATIVE
TRICHOMONAS VAG: NEGATIVE

## 2018-08-18 LAB — URINALYSIS, MICROSCOPIC ONLY
BACTERIA UA: NONE SEEN
Casts: NONE SEEN /lpf
Epithelial Cells (non renal): >10 /hpf — AB (ref 0–10)

## 2018-08-19 ENCOUNTER — Telehealth: Payer: Self-pay | Admitting: Emergency Medicine

## 2018-08-19 ENCOUNTER — Other Ambulatory Visit: Payer: Self-pay | Admitting: Obstetrics & Gynecology

## 2018-08-19 MED ORDER — LIDOCAINE 5 % EX OINT: TOPICAL_OINTMENT | CUTANEOUS | 0 refills | Status: DC

## 2018-08-19 MED ORDER — TERCONAZOLE 0.4 % VA CREA: 1.0000 | TOPICAL_CREAM | Freq: Every day | VAGINAL | 0 refills | Status: DC

## 2018-08-19 MED ORDER — LIDOCAINE 2 % EX GEL: 1.0000 "application " | Freq: Three times a day (TID) | CUTANEOUS | 0 refills | Status: DC

## 2018-08-19 MED ORDER — TERCONAZOLE 0.4 % VA CREA: 1.0000 | TOPICAL_CREAM | Freq: Every day | VAGINAL | 0 refills | Status: AC

## 2018-08-19 NOTE — Telephone Encounter (Signed)
[facility] called, Lidocaine 5% ointment not covered by insurance. Needs prior authorization.   Plan covers 3% cream.    Call to [facility] to request prior authorization for Lidocaine 5% ointment at [phone].  3% cream does not meet patient needs.   Ref: [id number].   Plan covers lidocaine 2% jelly if available.   Prior authorization pending.

## 2018-08-19 NOTE — Telephone Encounter (Signed)
Order for 2% lidocaine Jelly okay per Dr. Sabra Heck.  Call to [facility]. They do not have in stock.   Reviewed with Dr. Sabra Heck, sent to [facility] for orders.  Encounter closed.

## 2018-08-26 DIAGNOSIS — M6289 Other specified disorders of muscle: Secondary | ICD-10-CM | POA: Diagnosis not present

## 2018-08-26 DIAGNOSIS — N393 Stress incontinence (female) (male): Secondary | ICD-10-CM | POA: Diagnosis not present

## 2018-08-26 DIAGNOSIS — M6281 Muscle weakness (generalized): Secondary | ICD-10-CM | POA: Diagnosis not present

## 2018-08-29 DIAGNOSIS — M6281 Muscle weakness (generalized): Secondary | ICD-10-CM | POA: Diagnosis not present

## 2018-08-29 DIAGNOSIS — M6289 Other specified disorders of muscle: Secondary | ICD-10-CM | POA: Diagnosis not present

## 2018-08-29 DIAGNOSIS — N393 Stress incontinence (female) (male): Secondary | ICD-10-CM | POA: Diagnosis not present

## 2018-09-05 DIAGNOSIS — N393 Stress incontinence (female) (male): Secondary | ICD-10-CM | POA: Diagnosis not present

## 2018-09-05 DIAGNOSIS — M6289 Other specified disorders of muscle: Secondary | ICD-10-CM | POA: Diagnosis not present

## 2018-09-05 DIAGNOSIS — M6281 Muscle weakness (generalized): Secondary | ICD-10-CM | POA: Diagnosis not present

## 2018-09-12 ENCOUNTER — Other Ambulatory Visit (HOSPITAL_COMMUNITY)
Admission: RE | Admit: 2018-09-12 | Discharge: 2018-09-12 | Disposition: A | Discharge status: Home / Self Care | Payer: 59 | Source: Ambulatory Visit | Attending: Obstetrics & Gynecology | Admitting: Obstetrics & Gynecology

## 2018-09-12 ENCOUNTER — Ambulatory Visit (INDEPENDENT_AMBULATORY_CARE_PROVIDER_SITE_OTHER): Payer: 59 | Admitting: Obstetrics & Gynecology

## 2018-09-12 ENCOUNTER — Encounter: Payer: Self-pay | Admitting: Obstetrics & Gynecology

## 2018-09-12 ENCOUNTER — Other Ambulatory Visit: Payer: Self-pay

## 2018-09-12 VITALS — BP 128/70 | HR 76 | Resp 16 | Ht 66.5 in | Wt 229.0 lb

## 2018-09-12 DIAGNOSIS — Z01419 Encounter for gynecological examination (general) (routine) without abnormal findings: Secondary | ICD-10-CM

## 2018-09-12 DIAGNOSIS — D508 Other iron deficiency anemias: Secondary | ICD-10-CM | POA: Diagnosis not present

## 2018-09-12 DIAGNOSIS — Z124 Encounter for screening for malignant neoplasm of cervix: Secondary | ICD-10-CM

## 2018-09-12 DIAGNOSIS — Z9884 Bariatric surgery status: Secondary | ICD-10-CM | POA: Diagnosis not present

## 2018-09-12 DIAGNOSIS — Z0000 Encounter for general adult medical examination without abnormal findings: Secondary | ICD-10-CM | POA: Diagnosis not present

## 2018-09-12 NOTE — Progress Notes (Signed)
52 y.o. G3P2 Married White or Caucasian female here for annual exam.  Has not had any further vaginal spotting.  Figured out she had a small skin tear above the urethra.    Headaches are still improved.  Did not follow up with Dr. Tomi Likens.  Gave two prior MRIs to his office but did not get a phone call back about this so didn't know what else to do.  Does continue with some word recall issues and frustrations.  Did have iron deficiency last year.  Realizes some atypical symptoms she was having was related.  Has experienced a little of these as of late.  Patient's last menstrual period was 09/18/2009 (approximate).          Sexually active: Yes.    The current method of family planning is status post hysterectomy.    Exercising: Yes.     Smoker:  no  Health Maintenance: Pap:  08/26/17 Neg   06/04/16 Neg  History of abnormal Pap: yes. MMG:  01/19/18 Diagnostic Left BIRADS2:Benign. F/u screening 1 year  Colonoscopy:  03/18/18 Normal. F/u 5 years  BMD:   2011 TDaP:  2011 Pneumonia vaccine(s):  n/a Shingrix:   No Hep C testing: 08/27/17 Neg Screening Labs: Here today   reports that she has never smoked. She has never used smokeless tobacco. She reports that she does not drink alcohol or use drugs.  Past Medical History:  Diagnosis Date  . Endometrioid adenocarcinoma    Stage IA grade 1  . Eye pressure    elevated  . Infertility, female    clomid and IUI x 3  . Menorrhagia   . Migraines    migraine headaches  . Morbid obesity ([place])   . Rapid heartbeat   . Varicose veins   . Vertigo     Past Surgical History:  Procedure Laterality Date  . CESAREAN SECTION     x2  . DIAGNOSTIC LAPAROSCOPY     for infertility that was negative  . HERNIA REPAIR  3/12  . LAPAROSCOPIC CHOLECYSTECTOMY  1994  . LAPAROSCOPIC RADICAL TOTAL HYSTERECTOMY W/ NODE BIOPSY  12/10   with BSO  . NASAL SINUS SURGERY    . 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 TONSILLECTOMY     x 3    Current Outpatient  Medications  Medication Sig Dispense Refill  . ALPRAZolam (XANAX) 0.5 MG tablet TAKE 1/2 TO 1 TABLET BY MOUTH AS NEEDED FOR SLEEP OR ANXIETY 45 tablet 1  . buPROPion (WELLBUTRIN XL) 300 MG 24 hr tablet Take 1 tablet (300 mg total) by mouth daily. 90 tablet 4  . Cyanocobalamin (B-12) 2500 MCG SUBL Place 2,500 mcg under the tongue daily. 30 tablet 11  . desvenlafaxine (PRISTIQ) 100 MG 24 hr tablet Take 1 tablet (100 mg total) by mouth daily. 90 tablet 4  . ergocalciferol (VITAMIN D2) 50000 units capsule Take 1 capsule (50,000 Units total) by mouth 2 (two) times a week. 16 capsule 6  . estradiol (VIVELLE-DOT) 0.1 MG/24HR patch Place 1 patch (0.1 mg total) 2 (two) times a week onto the skin. 24 patch 4  . Eszopiclone 3 MG TABS TAKE 1 TABLET BY MOUTH IMMEDIATELY BEFORE BEDTIME 90 tablet 1  . fluticasone (FLONASE) 50 MCG/ACT nasal spray PLACE 2 SPRAYS INTO THE NOSE DAILY. 16 g 3  . Multiple Vitamins-Minerals (MULTIVITAMIN PO) Take 1 tablet by mouth 2 (two) times a week.     . promethazine (PHENERGAN) 12.5 MG tablet 1-2 tablets po q 4  hours prn nausea 20 tablet 0  . rizatriptan (MAXALT) 10 MG tablet Take 1 tablet earliest onset of migraine.  May repeat once in 2 hours if needed 30 tablet 4  . spironolactone (ALDACTONE) 100 MG tablet Take 1 tablet (100 mg total) 2 (two) times daily by mouth. (Patient taking differently: Take 100 mg by mouth daily. ) 180 tablet 4  . Topiramate ER (QUDEXY XR) 150 MG CS24 sprinkle capsule Take 150 mg by mouth daily. 90 each 3   No current facility-administered medications for this visit.     Family History  Problem Relation Age of Onset  . Coronary artery disease Mother   . Heart disease Mother   . Colon cancer Father   . Coronary artery disease Father   . Diabetes Father   . Cancer Father        lung  . Heart disease Father   . Prostate cancer Paternal Grandfather   . Cancer Paternal Grandfather        prostate  . Cancer Maternal Uncle        throat  . Lung  cancer Paternal Grandmother   . Cancer Maternal Grandmother        lung    Review of Systems  All other systems reviewed and are negative.   Exam:   BP 128/70 (BP Location: Right Arm, Patient Position: Sitting, Cuff Size: Large)   Pulse 76   Resp 16   Ht 5' 6.5" (1.689 m)   Wt 229 lb (103.9 kg)   LMP 09/18/2009 (Approximate)   BMI 36.41 kg/m    Weight change: -14#  Height: 5' 6.5" (168.9 cm)  Ht Readings from Last 3 Encounters:  09/12/18 5' 6.5" (1.689 m)  09/22/17 5\' 7"  (1.702 m)  08/26/17 5' 6.75" (1.695 m)    General appearance: alert, cooperative and appears stated age Head: Normocephalic, without obvious abnormality, atraumatic Neck: no adenopathy, supple, symmetrical, trachea midline and thyroid normal to inspection and palpation Lungs: clear to auscultation bilaterally Breasts: normal appearance, no masses or tenderness Heart: regular rate and rhythm Abdomen: soft, non-tender; bowel sounds normal; no masses,  no organomegaly Extremities: extremities normal, atraumatic, no cyanosis or edema Skin: Skin color, texture, turgor normal. No rashes or lesions Lymph nodes: Cervical, supraclavicular, and axillary nodes normal. No abnormal inguinal nodes palpated Neurologic: Grossly normal   Pelvic: External genitalia:  no lesions              Urethra:  normal appearing urethra with no masses, tenderness or lesions              Bartholins and Skenes: normal                 Vagina: normal appearing vagina with normal color and discharge, no lesions              Cervix: absent              Pap taken: Yes.   Bimanual Exam:  Uterus:  uterus absent              Adnexa: no mass, fullness, tenderness               Rectovaginal: Confirms               Anus:  normal sphincter tone, no lesions  Chaperone was present for exam.  A:  Well Woman with normal exam H/o gastric bypass.  Down 14# from last year Osteopenia H/O stage 1A, grade  1 endometrial adenocarcinoma Family hx of  colon cancer in father, most recent colonoscopy was 5/19 Migraines that are improved  P:   Mammogram guidelines reviewed Pap obtained today.  Aware recommendations for follow up pap smears after endometrial cancer have changed but she still desires this Return for fasting lab work:  TSh, B6, Vit D, Iron and ferritin levels, HbA1C, folate, CMP, CBC with diff On vivelle patch 0.1mg  patches twice weekly Bupropion 300mg  daily spironolactone 100mg  daily.  No RFs needed. Xanax 0.5mg  prn.  No rx needed for this currently as well. return annually or prn

## 2018-09-13 ENCOUNTER — Other Ambulatory Visit (INDEPENDENT_AMBULATORY_CARE_PROVIDER_SITE_OTHER): Payer: 59

## 2018-09-13 DIAGNOSIS — Z0000 Encounter for general adult medical examination without abnormal findings: Secondary | ICD-10-CM | POA: Diagnosis not present

## 2018-09-13 DIAGNOSIS — D508 Other iron deficiency anemias: Secondary | ICD-10-CM

## 2018-09-13 DIAGNOSIS — Z9884 Bariatric surgery status: Secondary | ICD-10-CM

## 2018-09-13 LAB — CYTOLOGY - PAP
Diagnosis: NEGATIVE
HPV: NOT DETECTED

## 2018-09-19 LAB — LIPID PANEL
CHOLESTEROL TOTAL: 184 mg/dL (ref 100–199)
Chol/HDL Ratio: 2.8 ratio (ref 0.0–4.4)
HDL: 65 mg/dL (ref >39)
LDL CALC: 104 mg/dL — AB (ref 0–99)
Triglycerides: 75 mg/dL (ref 0–149)
VLDL CHOLESTEROL CAL: 15 mg/dL (ref 5–40)

## 2018-09-19 LAB — CBC WITH DIFFERENTIAL/PLATELET
BASOS: 1 %
Basophils Absolute: 0 10*3/uL (ref 0.0–0.2)
EOS (ABSOLUTE): 0.1 10*3/uL (ref 0.0–0.4)
Eos: 2 %
Hematocrit: 39.6 % (ref 34.0–46.6)
Hemoglobin: 13.9 g/dL (ref 11.1–15.9)
IMMATURE GRANULOCYTES: 0 %
Immature Grans (Abs): 0 10*3/uL (ref 0.0–0.1)
Lymphocytes Absolute: 0.9 10*3/uL (ref 0.7–3.1)
Lymphs: 25 %
MCH: 32.6 pg (ref 26.6–33.0)
MCHC: 35.1 g/dL (ref 31.5–35.7)
MCV: 93 fL (ref 79–97)
MONOS ABS: 0.3 10*3/uL (ref 0.1–0.9)
Monocytes: 9 %
NEUTROS PCT: 63 %
Neutrophils Absolute: 2.3 10*3/uL (ref 1.4–7.0)
PLATELETS: 202 10*3/uL (ref 150–450)
RBC: 4.27 x10E6/uL (ref 3.77–5.28)
RDW: 12.6 % (ref 12.3–15.4)
WBC: 3.6 10*3/uL (ref 3.4–10.8)

## 2018-09-19 LAB — COMPREHENSIVE METABOLIC PANEL
A/G RATIO: 2 (ref 1.2–2.2)
ALT: 24 IU/L (ref 0–32)
AST: 31 IU/L (ref 0–40)
Albumin: 4.2 g/dL (ref 3.5–5.5)
Alkaline Phosphatase: 72 IU/L (ref 39–117)
BILIRUBIN TOTAL: 0.5 mg/dL (ref 0.0–1.2)
BUN/Creatinine Ratio: 13 (ref 9–23)
BUN: 12 mg/dL (ref 6–24)
CALCIUM: 9.1 mg/dL (ref 8.7–10.2)
CHLORIDE: 107 mmol/L — AB (ref 96–106)
CO2: 20 mmol/L (ref 20–29)
Creatinine, Ser: 0.89 mg/dL (ref 0.57–1.00)
GFR, EST AFRICAN AMERICAN: 86 mL/min/{1.73_m2} (ref >59)
GFR, EST NON AFRICAN AMERICAN: 75 mL/min/{1.73_m2} (ref >59)
GLOBULIN, TOTAL: 2.1 g/dL (ref 1.5–4.5)
Glucose: 89 mg/dL (ref 65–99)
POTASSIUM: 3.8 mmol/L (ref 3.5–5.2)
SODIUM: 142 mmol/L (ref 134–144)
TOTAL PROTEIN: 6.3 g/dL (ref 6.0–8.5)

## 2018-09-19 LAB — FOLATE: Folate: 4.9 ng/mL (ref >3.0)

## 2018-09-19 LAB — IRON,TIBC AND FERRITIN PANEL
Ferritin: 11 ng/mL — ABNORMAL LOW (ref 15–150)
IRON: 120 ug/dL (ref 27–159)
Iron Saturation: 30 % (ref 15–55)
Total Iron Binding Capacity: 399 ug/dL (ref 250–450)
UIBC: 279 ug/dL (ref 131–425)

## 2018-09-19 LAB — HEMOGLOBIN A1C
Est. average glucose Bld gHb Est-mCnc: 105 mg/dL
Hgb A1c MFr Bld: 5.3 % (ref 4.8–5.6)

## 2018-09-19 LAB — VITAMIN B6: Vitamin B6: 5.8 ug/L (ref 2.0–32.8)

## 2018-09-19 LAB — TSH: TSH: 2.02 u[IU]/mL (ref 0.450–4.500)

## 2018-09-19 LAB — VITAMIN D 25 HYDROXY (VIT D DEFICIENCY, FRACTURES): VIT D 25 HYDROXY: 25.2 ng/mL — AB (ref 30.0–100.0)

## 2018-09-19 MED FILL — buPROPion HCL ER (XL) 300 M: 300 | 90 days supply | Qty: 90 | Fill #2

## 2018-09-20 ENCOUNTER — Other Ambulatory Visit: Payer: Self-pay | Admitting: *Deleted

## 2018-09-20 MED ORDER — PROMETHAZINE HCL 25 MG PO TABS: 25.0000 mg | ORAL_TABLET | Freq: Three times a day (TID) | ORAL | 0 refills | Status: DC | PRN

## 2018-09-20 MED ORDER — SPIRONOLACTONE 100 MG PO TABS: 100.0000 mg | ORAL_TABLET | Freq: Two times a day (BID) | ORAL | 4 refills | Status: DC

## 2018-09-20 MED ORDER — ESTRADIOL 0.1 MG/24HR TD PTTW: 1.0000 | MEDICATED_PATCH | TRANSDERMAL | 4 refills | Status: DC

## 2018-09-20 MED ORDER — ERGOCALCIFEROL 1.25 MG (50000 UT) PO CAPS: 50000.0000 [IU] | ORAL_CAPSULE | ORAL | 6 refills | Status: DC

## 2018-09-20 MED ORDER — ALPRAZOLAM 0.5 MG PO TABS: ORAL_TABLET | ORAL | 1 refills | Status: DC

## 2018-09-20 MED ORDER — TOPIRAMATE ER 150 MG PO SPRINKLE CAP24: 150.0000 mg | EXTENDED_RELEASE_CAPSULE | Freq: Every day | ORAL | 3 refills | Status: DC

## 2018-09-20 MED ORDER — ESZOPICLONE 3 MG PO TABS: ORAL_TABLET | ORAL | 1 refills | Status: DC

## 2018-09-20 MED FILL — DESVENLAFAXINE SUC ER 100 M: 100 | 90 days supply | Qty: 90 | Fill #0

## 2018-09-20 MED FILL — RIZATRIPTAN BENZOATE 10 MG: 10 | 50 days supply | Qty: 30 | Fill #1

## 2018-09-21 MED FILL — VIT D2 1.25 MG (50,000 UNIT: 1.25 MG | 56 days supply | Qty: 16 | Fill #3

## 2018-09-21 MED FILL — ESZOPICLONE 3 MG TABS: 3 | 90 days supply | Qty: 90 | Fill #0

## 2018-09-21 MED FILL — DOTTI 0.1 MG/24HR PTTW: 0.1 | 84 days supply | Qty: 24 | Fill #0

## 2018-09-21 MED FILL — ALPRAZolam 0.5 MG TABS: 0.5 | 45 days supply | Qty: 45 | Fill #0

## 2018-11-01 MED FILL — TOPIRAMATE ER 150 MG CAP: 150 | 90 days supply | Qty: 90 | Fill #0

## 2018-12-06 ENCOUNTER — Ambulatory Visit (INDEPENDENT_AMBULATORY_CARE_PROVIDER_SITE_OTHER): Payer: 59 | Admitting: Family Medicine

## 2018-12-06 ENCOUNTER — Encounter: Payer: Self-pay | Admitting: Family Medicine

## 2018-12-06 VITALS — BP 122/88 | HR 70 | Temp 97.6°F | Ht 67.0 in | Wt 238.4 lb

## 2018-12-06 DIAGNOSIS — J309 Allergic rhinitis, unspecified: Secondary | ICD-10-CM | POA: Diagnosis not present

## 2018-12-06 DIAGNOSIS — H9193 Unspecified hearing loss, bilateral: Secondary | ICD-10-CM

## 2018-12-06 DIAGNOSIS — F5101 Primary insomnia: Secondary | ICD-10-CM

## 2018-12-06 DIAGNOSIS — D72819 Decreased white blood cell count, unspecified: Secondary | ICD-10-CM

## 2018-12-06 DIAGNOSIS — R51 Headache: Secondary | ICD-10-CM

## 2018-12-06 DIAGNOSIS — Z9884 Bariatric surgery status: Secondary | ICD-10-CM | POA: Diagnosis not present

## 2018-12-06 DIAGNOSIS — E161 Other hypoglycemia: Secondary | ICD-10-CM

## 2018-12-06 DIAGNOSIS — Z8249 Family history of ischemic heart disease and other diseases of the circulatory system: Secondary | ICD-10-CM

## 2018-12-06 DIAGNOSIS — G43909 Migraine, unspecified, not intractable, without status migrainosus: Secondary | ICD-10-CM

## 2018-12-06 DIAGNOSIS — R5383 Other fatigue: Secondary | ICD-10-CM | POA: Diagnosis not present

## 2018-12-06 DIAGNOSIS — D508 Other iron deficiency anemias: Secondary | ICD-10-CM | POA: Diagnosis not present

## 2018-12-06 DIAGNOSIS — K909 Intestinal malabsorption, unspecified: Secondary | ICD-10-CM

## 2018-12-06 DIAGNOSIS — G4486 Cervicogenic headache: Secondary | ICD-10-CM

## 2018-12-06 DIAGNOSIS — R635 Abnormal weight gain: Secondary | ICD-10-CM | POA: Diagnosis not present

## 2018-12-06 MED ORDER — TRAZODONE HCL 50 MG PO TABS: 25.0000 mg | ORAL_TABLET | Freq: Every evening | ORAL | 3 refills | Status: DC | PRN

## 2018-12-06 MED FILL — traZODone HCL 50 MG TABS: 50 | 30 days supply | Qty: 30 | Fill #0

## 2018-12-06 NOTE — Progress Notes (Signed)
Judith Holland is a 53 y.o. female is here to [place].   Patient Care Team: Briscoe Deutscher, DO as PCP - General (Family Medicine)   History of Present Illness:   HPI: See Assessment and Plan section for Problem Based Charting of issues discussed today.   Health Maintenance Due  Topic Date Due  . HIV Screening  07/25/1981   No flowsheet data found.  PMHx, SurgHx, SocialHx, Medications, and Allergies were reviewed in the Visit Navigator and updated as appropriate.   Past Medical History:  Diagnosis Date  . Allergy   . Anemia    iron infusion  . Endometrioid adenocarcinoma    Stage IA grade 1  . Eye pressure    elevated  . Infertility, female    clomid and IUI x 3  . Menorrhagia   . Migraines    migraine headaches  . Morbid obesity ([place])   . Rapid heartbeat   . Varicose veins   . Vertigo      Past Surgical History:  Procedure Laterality Date  . CESAREAN SECTION     x2  . DIAGNOSTIC LAPAROSCOPY     for infertility that was negative  . HERNIA REPAIR  3/12  . LAPAROSCOPIC CHOLECYSTECTOMY  1994  . LAPAROSCOPIC RADICAL TOTAL HYSTERECTOMY W/ NODE BIOPSY  12/10   with BSO  . NASAL SINUS SURGERY    . 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 TONSILLECTOMY     x 3     Family History  Problem Relation Age of Onset  . Coronary artery disease Mother   . Heart disease Mother   . Colon cancer Father   . Coronary artery disease Father   . Diabetes Father   . Cancer Father        lung  . Heart disease Father   . Prostate cancer Paternal Grandfather   . Cancer Paternal Grandfather        prostate  . Cancer Maternal Uncle        throat  . Lung cancer Paternal Grandmother   . Cancer Maternal Grandmother        lung    Social History   Tobacco Use  . Smoking status: Never Smoker  . Smokeless tobacco: Never Used  Substance Use Topics  . Alcohol use: No    Alcohol/week: 0.0 standard drinks  . Drug use: No    Current Medications and Allergies   .   ALPRAZolam (XANAX) 0.5 MG tablet, TAKE 1/2 TO 1 TABLET BY MOUTH AS NEEDED FOR SLEEP OR ANXIETY, Disp: 45 tablet, Rfl: 1 .  buPROPion (WELLBUTRIN XL) 300 MG 24 hr tablet, Take 1 tablet (300 mg total) by mouth daily., Disp: 90 tablet, Rfl: 4 .  Cyanocobalamin (B-12) 2500 MCG SUBL, Place 2,500 mcg under the tongue daily., Disp: 30 tablet, Rfl: 11 .  desvenlafaxine (PRISTIQ) 100 MG 24 hr tablet, Take 1 tablet (100 mg total) by mouth daily., Disp: 90 tablet, Rfl: 4 .  ergocalciferol (VITAMIN D2) 1.25 MG (50000 UT) capsule, Take 1 capsule (50,000 Units total) by mouth 2 (two) times a week., Disp: 16 capsule, Rfl: 6 .  estradiol (VIVELLE-DOT) 0.1 MG/24HR patch, Place 1 patch (0.1 mg total) onto the skin 2 (two) times a week., Disp: 24 patch, Rfl: 4 .  Eszopiclone 3 MG TABS, TAKE 1 TABLET BY MOUTH IMMEDIATELY BEFORE BEDTIME, Disp: 90 tablet, Rfl: 1 .  fluticasone (FLONASE) 50 MCG/ACT nasal spray, PLACE 2 SPRAYS INTO THE NOSE DAILY., Disp:  16 g, Rfl: 3 .  Multiple Vitamins-Minerals (MULTIVITAMIN PO), Take 1 tablet by mouth 2 (two) times a week. , Disp: , Rfl:  .  promethazine (PHENERGAN) 25 MG tablet, Take 1 tablet (25 mg total) by mouth every 8 (eight) hours as needed for nausea., Disp: 30 tablet, Rfl: 0 .  rizatriptan (MAXALT) 10 MG tablet, Take 1 tablet earliest onset of migraine.  May repeat once in 2 hours if needed, Disp: 30 tablet, Rfl: 4 .  spironolactone (ALDACTONE) 100 MG tablet, Take 1 tablet (100 mg total) by mouth 2 (two) times daily., Disp: 180 tablet, Rfl: 4 .  topiramate ER (QUDEXY XR) 150 MG CS24 sprinkle capsule, Take 1 capsule (150 mg total) by mouth daily., Disp: 90 each, Rfl: 3  No Known Allergies   Review of Systems   Pertinent items are noted in the HPI. Otherwise, a complete ROS is negative.  Vitals   Vitals:   12/06/18 1607  BP: 122/88  Pulse: 70  Temp: 97.6 F (36.4 C)  TempSrc: Oral  SpO2: 98%  Weight: 238 lb 6.4 oz (108.1 kg)  Height: 5\' 7"  (1.702 m)     Body mass  index is 37.34 kg/m.  Physical Exam   Physical Exam Vitals signs and nursing note reviewed.  Constitutional:      General: She is not in acute distress.    Appearance: Normal appearance.  HENT:     Head: Normocephalic and atraumatic.     Nose: Nose normal.     Mouth/Throat:     Mouth: Mucous membranes are moist.  Eyes:     Pupils: Pupils are equal, round, and reactive to light.  Neck:     Musculoskeletal: Normal range of motion and neck supple.  Cardiovascular:     Rate and Rhythm: Normal rate and regular rhythm.     Heart sounds: Normal heart sounds.  Pulmonary:     Effort: Pulmonary effort is normal.  Abdominal:     Palpations: Abdomen is soft.  Skin:    General: Skin is warm.     Capillary Refill: Capillary refill takes less than 2 seconds.  Neurological:     General: No focal deficit present.     Mental Status: She is alert.  Psychiatric:        Mood and Affect: Mood normal.        Behavior: Behavior normal.        Thought Content: Thought content normal.        Judgment: Judgment normal.    Assessment and Plan   Family history of early CAD The 10-year ASCVD risk score Mikey Bussing DC Jr., et al., 2013) is: 1%   Values used to calculate the score:     Age: 33 years     Sex: Female     Is Non-Hispanic African American: No     Diabetic: No     Tobacco smoker: No     Systolic Blood Pressure: 008 mmHg     Is BP treated: No     HDL Cholesterol: 65 mg/dL     Total Cholesterol: 184 mg/dL  Bilateral hearing loss Patient failed hearing screen today.  We will go ahead and send to audiologist.  Migraines Headaches are fairly controlled with current treatment.  On Topamax 150 mg p.o. daily and with Maxalt/Phenergan as needed for acute flares.  History: She had a history of migraines for many years, about 1-2 times each week, right retro-orbital area severe pounding headaches, with associated light  noise sensitivity, lasting for 2-3 hours, usually relieved by lying down in  dark quiet room, and sleep.  HA became more frequent in 2016.  Around that time, she also began experiencing memory deficits as well as poor coordination and vertigo.  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  MRI of brain without contrast from 08/21/14 revealed nonspecific small foci of T2 hyperintensity in the cerebral white matter.    MRI 2015: Abnormal MRI brain (without) demonstrating: 1. Multiple (>20) small, round, periventricular and subcortical and juxtacortical T2 hyperintensities. These findings are non-specific and considerations include autoimmune, inflammatory, post-infectious, microvascular ischemic or migraine associated etiologies.  She had a repeat MRI but a comparison was not made.  MS was considered.  She complains of memory deficits that precede initiation of topiramate.  She has word-finding difficulty.  Sometimes she cannot recall the name of people she knows.  She will sometimes briefly get disoriented while driving. Previous autoimmune and vitamin/mineral testing normal/negative.  Will call and discuss with patient: - With hx of gastric bypass, important to check thiamine, copper, zinc along with usual B12, riboflavin. Okay to add in orders. - Did the patient have a LP? I see the order, but patient canceled. I would be okay with ordering LP, just let me know. - Can we order another MRI to compare?   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  She admits to taking the Centrum Silver multivitamin every few days.  IDA (iron deficiency anemia), requiring iron infusions Okay to take her orders for iron infusions.  Will order Feraheme x 2, 1 week apart.  Will need to be premedicated with Benadryl for each infusion. Will recheck 1 month after.  Morbid obesity ([place]), BMI 37 plus comorbid conditions History of bariatric surgery.  She is active.   Will make sure to avoid obesogenic medications.  Primary insomnia Patient is not controlled with Lunesta.  Will try all trazodone to see if that helps control her insomnia a little bit better.  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  Will review hematology notes to see if this is been addressed.  Hypoglycemic reaction One of the patient's main concerns during our first meeting was whether or not she was developing diabetes.  She had a normal A1c, so her primary physician told her that there was no way she had diabetes or prediabetes.  She did note some concerning symptoms that may have been hypoglycemic episodes so I provided a glucometer for her to use during his episodes.  We did discuss diet briefly.  She has a history of Roux-en-Y.  Patient came back to the office for lab work.  She was not actually fasting during that time.  Her blood sugar was noted to be 44.  This was discussed with her over the phone.  She did endorse weakness and shaking.  She was instructed to monitor her carbohydrate and protein intakes.  She will check blood sugars via glucometer.  There is concerned that this is not a hypoglycemia as a precursor to diabetes.  This is likely complicated by her gastric bypass.  We will get more information before we decide on treatment.  We will offer referral to diabetes nutrition center to meet with a dietitian.  Orders Placed This Encounter  Procedures  . CBC with Differential/Platelet  . Comprehensive metabolic panel  . Insulin, Free (Bioactive)  .  Iron, TIBC and Ferritin Panel  . TSH  . Ambulatory referral to Audiology   Meds ordered this encounter  Medications  . traZODone (DESYREL) 50 MG tablet    Sig: Take 0.5-1 tablets (25-50 mg total) by mouth at bedtime as needed for sleep.    Dispense:  30 tablet    Refill:  3   . Reviewed expectations re: course of current medical issues. . Discussed self-management of symptoms. . Outlined signs and symptoms indicating need for  more acute intervention. . Patient verbalized understanding and all questions were answered. Marland Kitchen Health Maintenance issues including appropriate healthy diet, exercise, and smoking avoidance were discussed with patient. . See orders for this visit as documented in the electronic medical record. . Patient received an After Visit Summary.  Briscoe Deutscher, DO [place], [place] [facility] 12/11/2018

## 2018-12-06 NOTE — Patient Instructions (Addendum)
It was so nice to meet you today!  I will order a Cardiac CT scan.  I am giving you a glucometer to check both fasting and possible low blood sugars.  I called Trazodone in to try in place of the Lunesta.  I will order iron infusions.   I am referring you to Audiology.

## 2018-12-07 ENCOUNTER — Other Ambulatory Visit: Payer: Self-pay | Admitting: Emergency Medicine

## 2018-12-07 ENCOUNTER — Telehealth: Payer: Self-pay | Admitting: Emergency Medicine

## 2018-12-07 DIAGNOSIS — R5383 Other fatigue: Secondary | ICD-10-CM

## 2018-12-07 NOTE — Telephone Encounter (Signed)
Released orders from PCP so she can have labs drawn here at [facility]. Pt request.

## 2018-12-09 ENCOUNTER — Other Ambulatory Visit (INDEPENDENT_AMBULATORY_CARE_PROVIDER_SITE_OTHER): Payer: 59

## 2018-12-09 ENCOUNTER — Other Ambulatory Visit: Payer: Self-pay

## 2018-12-09 ENCOUNTER — Telehealth: Payer: Self-pay

## 2018-12-09 DIAGNOSIS — R5383 Other fatigue: Secondary | ICD-10-CM | POA: Diagnosis not present

## 2018-12-09 LAB — COMPREHENSIVE METABOLIC PANEL
ALT: 14 U/L (ref 0–35)
AST: 20 U/L (ref 0–37)
Albumin: 4.2 g/dL (ref 3.5–5.2)
Alkaline Phosphatase: 61 U/L (ref 39–117)
BUN: 16 mg/dL (ref 6–23)
CO2: 25 mEq/L (ref 19–32)
Calcium: 9.2 mg/dL (ref 8.4–10.5)
Chloride: 110 mEq/L (ref 96–112)
Creatinine, Ser: 0.91 mg/dL (ref 0.40–1.20)
GFR: 64.82 mL/min (ref >60.00)
Glucose, Bld: 44 mg/dL — CL (ref 70–99)
Potassium: 4 mEq/L (ref 3.5–5.1)
Sodium: 141 mEq/L (ref 135–145)
Total Bilirubin: 0.5 mg/dL (ref 0.2–1.2)
Total Protein: 6.4 g/dL (ref 6.0–8.3)

## 2018-12-09 LAB — CBC WITH DIFFERENTIAL/PLATELET
Basophils Absolute: 0 10*3/uL (ref 0.0–0.1)
Basophils Relative: 0.5 % (ref 0.0–3.0)
Eosinophils Absolute: 0 10*3/uL (ref 0.0–0.7)
Eosinophils Relative: 0.4 % (ref 0.0–5.0)
HCT: 41.1 % (ref 36.0–46.0)
Hemoglobin: 13.8 g/dL (ref 12.0–15.0)
Lymphocytes Relative: 25.2 % (ref 12.0–46.0)
Lymphs Abs: 0.9 10*3/uL (ref 0.7–4.0)
MCHC: 33.6 g/dL (ref 30.0–36.0)
MCV: 94 fl (ref 78.0–100.0)
Monocytes Absolute: 0.4 10*3/uL (ref 0.1–1.0)
Monocytes Relative: 11 % (ref 3.0–12.0)
Neutro Abs: 2.2 10*3/uL (ref 1.4–7.7)
Neutrophils Relative %: 62.9 % (ref 43.0–77.0)
Platelets: 184 10*3/uL (ref 150.0–400.0)
RBC: 4.37 Mil/uL (ref 3.87–5.11)
RDW: 12.5 % (ref 11.5–15.5)
WBC: 3.4 10*3/uL — ABNORMAL LOW (ref 4.0–10.5)

## 2018-12-09 LAB — IBC + FERRITIN
Ferritin: 5.1 ng/mL — ABNORMAL LOW (ref 10.0–291.0)
Iron: 122 ug/dL (ref 42–145)
Saturation Ratios: 22.7 % (ref 20.0–50.0)
Transferrin: 384 mg/dL — ABNORMAL HIGH (ref 212.0–360.0)

## 2018-12-09 LAB — TSH: TSH: 1.12 u[IU]/mL (ref 0.35–4.50)

## 2018-12-09 MED ORDER — BLOOD GLUCOSE MONITOR KIT: PACK | 0 refills | Status: DC

## 2018-12-09 MED FILL — FREESTYLE LANCETS: 100 days supply | Qty: 100 | Fill #0

## 2018-12-09 MED FILL — FREESTYLE LITE TEST STRIP: 25 days supply | Qty: 100 | Fill #0

## 2018-12-09 MED FILL — FREESTYLE LITE METER: 20 days supply | Qty: 1 | Fill #0

## 2018-12-09 NOTE — Telephone Encounter (Signed)
Received critical lab at 3:30. Patient had glucose of 44 that was drawn today at 11:14. Called patient she was weak feeling when she had labs done. She had ate breakfast before she was at our office. I had her check blood sugar while on the phone it was up to 75. I reviewed all red words with patient. I also reviewed the 15g of carb every 40min and that she needs to eat frequently and protein at every meal. She will call if any problems or questions. I have called in script for meter and strips she will continue to check when symptomatic. Informed we will call next week with next step. If any red words she will go to ED

## 2018-12-11 ENCOUNTER — Encounter: Payer: Self-pay | Admitting: Family Medicine

## 2018-12-11 DIAGNOSIS — E161 Other hypoglycemia: Secondary | ICD-10-CM

## 2018-12-11 DIAGNOSIS — G4486 Cervicogenic headache: Secondary | ICD-10-CM

## 2018-12-11 DIAGNOSIS — F5101 Primary insomnia: Secondary | ICD-10-CM | POA: Insufficient documentation

## 2018-12-11 DIAGNOSIS — G43909 Migraine, unspecified, not intractable, without status migrainosus: Secondary | ICD-10-CM

## 2018-12-11 DIAGNOSIS — D72819 Decreased white blood cell count, unspecified: Secondary | ICD-10-CM | POA: Insufficient documentation

## 2018-12-11 DIAGNOSIS — Z8249 Family history of ischemic heart disease and other diseases of the circulatory system: Secondary | ICD-10-CM

## 2018-12-11 DIAGNOSIS — R51 Headache: Secondary | ICD-10-CM

## 2018-12-11 DIAGNOSIS — H9193 Unspecified hearing loss, bilateral: Secondary | ICD-10-CM | POA: Insufficient documentation

## 2018-12-11 DIAGNOSIS — H903 Sensorineural hearing loss, bilateral: Secondary | ICD-10-CM | POA: Insufficient documentation

## 2018-12-11 HISTORY — DX: Cervicogenic headache: G44.86

## 2018-12-11 HISTORY — DX: Decreased white blood cell count, unspecified: D72.819

## 2018-12-11 HISTORY — DX: Other hypoglycemia: E16.1

## 2018-12-11 NOTE — Assessment & Plan Note (Signed)
Taking a multivitamin as well as vitamin D and B12.  She admits to taking the Centrum Silver multivitamin every few days.

## 2018-12-11 NOTE — Assessment & Plan Note (Addendum)
Okay to take her orders for iron infusions.  Will order Feraheme x 2, 1 week apart.  Will need to be premedicated with Benadryl for each infusion. Will recheck 1 month after.

## 2018-12-11 NOTE — Assessment & Plan Note (Signed)
Patient is not controlled with Lunesta.  Will try all trazodone to see if that helps control her insomnia a little bit better.

## 2018-12-11 NOTE — Assessment & Plan Note (Signed)
The 10-year ASCVD risk score Mikey Bussing DC Brooke Bonito., et al., 2013) is: 1%   Values used to calculate the score:     Age: 53 years     Sex: Female     Is Non-Hispanic African American: No     Diabetic: No     Tobacco smoker: No     Systolic Blood Pressure: 891 mmHg     Is BP treated: No     HDL Cholesterol: 65 mg/dL     Total Cholesterol: 184 mg/dL

## 2018-12-11 NOTE — Assessment & Plan Note (Signed)
Patient failed hearing screen today.  We will go ahead and send to audiologist.

## 2018-12-11 NOTE — Assessment & Plan Note (Signed)
1 of the patient's main concerns during our first meeting was whether or not she was developing diabetes.  She had a normal A1c, so her primary physician told her that there was no way she had diabetes or prediabetes.  She did note some concerning symptoms that may have been hypoglycemic episodes so I provided a glucometer for her to use during his episodes.  We did discuss diet briefly.  She has a history of Roux-en-Y.  Patient came back to the office for lab work.  She was not actually fasting during that time.  Her blood sugar was noted to be 44.  This was discussed with her over the phone.  She did endorse weakness and shaking.  She was instructed to monitor her carbohydrate and protein intakes.  She will check blood sugars via glucometer.  There is concerned that this is not a hypoglycemia as a precursor to diabetes.  This is likely complicated by her gastric bypass.  We will get more information before we decide on treatment.  We will offer referral to diabetes nutrition center to meet with a dietitian.

## 2018-12-11 NOTE — Assessment & Plan Note (Signed)
History of bariatric surgery.  She is active.  Will make sure to avoid obesogenic medications.

## 2018-12-11 NOTE — Assessment & Plan Note (Signed)
Looks like this is a trend.  Will review hematology notes to see if this is been addressed.

## 2018-12-11 NOTE — Assessment & Plan Note (Addendum)
Headaches are fairly controlled with current treatment.  On Topamax 150 mg p.o. daily and with Maxalt/Phenergan as needed for acute flares.  History: She had a history of migraines for many years, about 1-2 times each week, right retro-orbital area severe pounding headaches, with associated light noise sensitivity, lasting for 2-3 hours, usually relieved by lying down in dark quiet room, and sleep.  HA became more frequent in 2016.  Around that time, she also began experiencing memory deficits as well as poor coordination and vertigo.  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  MRI of brain without contrast from 08/21/14 revealed nonspecific small foci of T2 hyperintensity in the cerebral white matter.    MRI 2015: Abnormal MRI brain (without) demonstrating: 1. Multiple (>20) small, round, periventricular and subcortical and juxtacortical T2 hyperintensities. These findings are non-specific and considerations include autoimmune, inflammatory, post-infectious, microvascular ischemic or migraine associated etiologies.  She had a repeat MRI but a comparison was not made.  MS was considered.  She complains of memory deficits that precede initiation of topiramate.  She has word-finding difficulty.  Sometimes she cannot recall the name of people she knows.  She will sometimes briefly get disoriented while driving. Previous autoimmune and vitamin/mineral testing normal/negative.  Will call and discuss with patient: - With hx of gastric bypass, important to check thiamine, copper, zinc along with usual B12, riboflavin. Okay to add in orders. - Did the patient have a LP? I see the order, but patient canceled. I would be okay with ordering LP, just let me know. - Can we order another MRI to compare?

## 2018-12-12 ENCOUNTER — Encounter: Payer: Self-pay | Admitting: Family Medicine

## 2018-12-15 ENCOUNTER — Other Ambulatory Visit: Payer: Self-pay

## 2018-12-15 DIAGNOSIS — D508 Other iron deficiency anemias: Secondary | ICD-10-CM

## 2018-12-16 ENCOUNTER — Other Ambulatory Visit: Payer: Self-pay

## 2018-12-16 DIAGNOSIS — K909 Intestinal malabsorption, unspecified: Secondary | ICD-10-CM

## 2018-12-16 DIAGNOSIS — E46 Unspecified protein-calorie malnutrition: Secondary | ICD-10-CM

## 2018-12-17 LAB — INSULIN, FREE (BIOACTIVE): Insulin, Free: 15.3 u[IU]/mL — ABNORMAL HIGH (ref 1.5–14.9)

## 2018-12-19 ENCOUNTER — Other Ambulatory Visit (INDEPENDENT_AMBULATORY_CARE_PROVIDER_SITE_OTHER): Payer: 59

## 2018-12-19 DIAGNOSIS — K909 Intestinal malabsorption, unspecified: Secondary | ICD-10-CM | POA: Diagnosis not present

## 2018-12-19 DIAGNOSIS — E46 Unspecified protein-calorie malnutrition: Secondary | ICD-10-CM

## 2018-12-20 ENCOUNTER — Other Ambulatory Visit: Payer: 59

## 2018-12-20 LAB — VITAMIN B12: Vitamin B-12: 409 pg/mL (ref 211–911)

## 2018-12-24 LAB — VITAMIN B1: Vitamin B1 (Thiamine): 11 nmol/L (ref 8–30)

## 2018-12-24 LAB — ZINC: Zinc: 68 ug/dL (ref 60–130)

## 2018-12-24 LAB — VITAMIN B2(RIBOFLAVIN),PLASMA: Vitamin B2(Riboflavin),Plasma: 6.5 nmol/L (ref 6.2–39.0)

## 2018-12-27 ENCOUNTER — Telehealth: Payer: Self-pay | Admitting: Family Medicine

## 2018-12-27 DIAGNOSIS — G4489 Other headache syndrome: Secondary | ICD-10-CM

## 2018-12-27 DIAGNOSIS — D72819 Decreased white blood cell count, unspecified: Secondary | ICD-10-CM

## 2018-12-27 DIAGNOSIS — G43909 Migraine, unspecified, not intractable, without status migrainosus: Secondary | ICD-10-CM

## 2018-12-27 NOTE — Telephone Encounter (Signed)
Copied from [place] [id number]. Topic: General - Other >> Dec 27, 2018  1:39 PM Leward Quan A wrote: Reason for CRM: Patient called with her representative to ask for referrals for MRI Brain and lower spine, Spinal tap, Iron infusion, and nutrition stated that Dr Juleen China is aware asking for this to be completed as soon as possible. Please call patient with an update. Ph# [phone]

## 2018-12-28 ENCOUNTER — Other Ambulatory Visit: Payer: Self-pay

## 2018-12-30 MED FILL — DESVENLAFAXINE SUC ER 100 M: 100 | 90 days supply | Qty: 90 | Fill #1

## 2018-12-30 MED FILL — VIT D2 1.25 MG (50,000 UNIT: 1.25 MG | 56 days supply | Qty: 16 | Fill #0

## 2018-12-30 MED FILL — DOTTI 0.1 MG/24HR PTTW: 0.1 | 84 days supply | Qty: 24 | Fill #1

## 2018-12-30 MED FILL — ALPRAZolam 0.5 MG TABS: 0.5 | 45 days supply | Qty: 45 | Fill #1

## 2018-12-30 MED FILL — ESZOPICLONE 3 MG TABS: 3 | 90 days supply | Qty: 90 | Fill #1

## 2018-12-30 MED FILL — SPIRONOLACTONE 100 MG TAB: 100 | 90 days supply | Qty: 180 | Fill #0

## 2018-12-30 MED FILL — buPROPion HCL ER (XL) 300 M: 300 | 90 days supply | Qty: 90 | Fill #3

## 2018-12-30 NOTE — Telephone Encounter (Signed)
Pt called back to check on this.  States that she still hasn't heard anything on her referrals.  Pt also cancelled her 1 month follow up for next week because she has had no appts with who she was supposed to see. Pt can be reached at [phone] or [phone]. No response at referral line.

## 2018-12-30 NOTE — Telephone Encounter (Signed)
See note

## 2018-12-30 NOTE — Telephone Encounter (Signed)
Spoke to pt gave number to set up infusion. Did we need to order MRI of brain and lumbar puncture?

## 2019-01-03 ENCOUNTER — Ambulatory Visit: Payer: 59 | Admitting: Family Medicine

## 2019-01-03 NOTE — Telephone Encounter (Signed)
Hi this kind fell through the crack can you make sure we get this done?

## 2019-01-03 NOTE — Telephone Encounter (Signed)
Orders complete.

## 2019-01-03 NOTE — Addendum Note (Signed)
Addended by: Briscoe Deutscher R on: 01/03/2019 07:45 AM   Modules accepted: Orders

## 2019-01-03 NOTE — Addendum Note (Signed)
Addended by: Francella Solian on: 01/03/2019 09:53 AM   Modules accepted: Orders

## 2019-01-04 ENCOUNTER — Telehealth: Payer: Self-pay | Admitting: Family Medicine

## 2019-01-04 NOTE — Telephone Encounter (Signed)
Her MRI is scheduled for 3/20 at [facility].  I am waiting for a return call from the radiologist to see when he wants to schedule the lumbar puncture.  Pt is aware and knows I will update her as soon as I hear back from the radiologist. She mentioned that a previous provider prescribed Xanex to help when she got an MRI.  She took 1mg  and did great.  She wanted Dr Juleen China to confirm that this was ok to do - she still has medication left.  She also wanted to know if she needs the MRI done now or if she should wait a little while to avoid going to the hospital.  She doesn't mind waiting and will do what Dr Juleen China recommends.  She would like a phone call back.

## 2019-01-04 NOTE — Addendum Note (Signed)
Addended by: Francella Solian on: 01/04/2019 02:06 PM   Modules accepted: Orders

## 2019-01-04 NOTE — Telephone Encounter (Signed)
Called patient reviewed all information she wanted to know if you feel like she would be ok to hold off until they are scheduling regular appointments? I put orders in as stat so we could schedule due to delay in getting done. Patients appointment for imaging is on Friday so we need to let her know tomorrow.

## 2019-01-05 NOTE — Telephone Encounter (Signed)
Okay to delay if needed.

## 2019-01-05 NOTE — Telephone Encounter (Signed)
Left detailed VM letting patient know it is ok to delay MRI for now and requested she call back if she has any further questions

## 2019-01-06 ENCOUNTER — Ambulatory Visit (HOSPITAL_COMMUNITY): Payer: 59

## 2019-02-08 MED FILL — TOPIRAMATE ER 150 MG CAP: 150 | 90 days supply | Qty: 90 | Fill #1

## 2019-02-16 ENCOUNTER — Inpatient Hospital Stay (HOSPITAL_COMMUNITY): Admission: RE | Admit: 2019-02-16 | Payer: 59 | Source: Ambulatory Visit

## 2019-02-22 ENCOUNTER — Telehealth: Payer: Self-pay | Admitting: Internal Medicine

## 2019-02-22 ENCOUNTER — Ambulatory Visit (HOSPITAL_COMMUNITY)
Admission: RE | Admit: 2019-02-22 | Discharge: 2019-02-22 | Disposition: A | Discharge status: Home / Self Care | Payer: 59 | Source: Ambulatory Visit | Attending: Family Medicine | Admitting: Family Medicine

## 2019-02-22 ENCOUNTER — Ambulatory Visit: Payer: Self-pay

## 2019-02-22 ENCOUNTER — Telehealth: Payer: Self-pay | Admitting: Family Medicine

## 2019-02-22 ENCOUNTER — Other Ambulatory Visit: Payer: Self-pay

## 2019-02-22 DIAGNOSIS — D508 Other iron deficiency anemias: Secondary | ICD-10-CM | POA: Insufficient documentation

## 2019-02-22 DIAGNOSIS — T7840XA Allergy, unspecified, initial encounter: Secondary | ICD-10-CM

## 2019-02-22 MED ORDER — SODIUM CHLORIDE 0.9 % IV SOLN: 20.0000 mg | Freq: Once | INTRAVENOUS | Status: DC

## 2019-02-22 MED ORDER — FERUMOXYTOL INJECTION 510 MG/17 ML: 510.0000 mg | Freq: Once | INTRAVENOUS | Status: DC

## 2019-02-22 MED ORDER — SODIUM CHLORIDE 0.9 % IV SOLN
40.0000 mg | Freq: Once | INTRAVENOUS | Status: AC
  Administered 2019-02-22: 40 mg via INTRAVENOUS
  Filled 2019-02-22: qty 4

## 2019-02-22 MED ORDER — DIPHENHYDRAMINE HCL 50 MG/ML IJ SOLN: 50.0000 mg | Freq: Once | INTRAMUSCULAR | Status: DC

## 2019-02-22 MED ORDER — SODIUM CHLORIDE 0.9 % IV SOLN
INTRAVENOUS | Status: DC | PRN
  Administered 2019-02-22: 250 mL via INTRAVENOUS

## 2019-02-22 MED ORDER — METHYLPREDNISOLONE SODIUM SUCC 125 MG IJ SOLR
INTRAMUSCULAR | Status: AC
  Administered 2019-02-22: 125 mg via INTRAVENOUS
  Filled 2019-02-22: qty 2

## 2019-02-22 MED ORDER — METHYLPREDNISOLONE SODIUM SUCC 125 MG IJ SOLR
125.0000 mg | Freq: Once | INTRAMUSCULAR | Status: AC
  Administered 2019-02-22: 125 mg via INTRAVENOUS

## 2019-02-22 MED ORDER — SODIUM CHLORIDE 0.9 % IV SOLN
510.0000 mg | Freq: Once | INTRAVENOUS | Status: AC
  Administered 2019-02-22: 510 mg via INTRAVENOUS
  Filled 2019-02-22: qty 17

## 2019-02-22 MED ORDER — SODIUM CHLORIDE 0.9 % IV SOLN
50.0000 mg | Freq: Once | INTRAVENOUS | Status: AC
  Administered 2019-02-22: 50 mg via INTRAVENOUS
  Filled 2019-02-22: qty 1

## 2019-02-22 NOTE — Progress Notes (Signed)
Patient received Feraheme via a PIV. Pre infusion IV Benadryl and IV Pepcid were administered. Observed for at least 30 minutes post infusion. During the observation period, patient complained of dry throat and cough. Patient's doctors office was called, 125 mg was ordered and given. Patient waited for additional 15 minutes and the  vital signs were re-checked.The vitals were stable, discharge instructions given, verbalized understanding. Patient alert, oriented and ambulatory at the time of discharge. Patient declined an offer of wheelchair for transportation.

## 2019-02-22 NOTE — Telephone Encounter (Signed)
The patient is that the infusion center, she has a history of dry cough on tongue tingling with IV iron infusion. Before the infusion today she was premed with Benadryl and Pepcid. At the end of the infusion she started to have dry cough and I was call to see if we could proceed with Solu-Medrol. Spoke with a nurse over there, no shortness of breath, rash, lip or tongue swelling.  Plan: Proceed with Solu-Medrol 125 mg now Okay to let the patient go home Benadryl OTC tonight 911 if symptoms progress, increased cough, shortness of breath, lip swelling. We will notify PCP

## 2019-02-22 NOTE — Progress Notes (Unsigned)
Virtual Visit via Video   Due to the COVID-19 pandemic, this visit was completed with telemedicine (audio/video) technology to reduce patient and provider exposure as well as to preserve personal protective equipment.   I connected with Judith Holland by a video enabled telemedicine application and verified that I am speaking with the correct person using two identifiers. Location patient: Home Location provider: [facility], Office Persons participating in the virtual visit: ODELLA APPELHANS, Francella Solian, CMA   I discussed the limitations of evaluation and management by telemedicine and the availability of in person appointments. The patient expressed understanding and agreed to proceed.  Care Team   Patient Care Team: Briscoe Deutscher, DO as PCP - General (Family Medicine)  Subjective:   HPI:   ROS   Patient Active Problem List   Diagnosis Date Noted  . Primary insomnia 12/11/2018  . Bilateral hearing loss 12/11/2018  . Migraines 12/11/2018  . Family history of early CAD 12/11/2018  . Morbid obesity ([place]), BMI 37 plus comorbid conditions 12/11/2018  . Leukopenia 12/11/2018  . Hypoglycemic reaction 12/11/2018  . Cervicogenic headache 12/11/2018  . IDA (iron deficiency anemia), requiring iron infusions 09/22/2017  . Malabsorption of iron 09/22/2017  . Stress incontinence 09/11/2015  . 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 Allergic rhinitis 10/02/2007    Social History   Tobacco Use  . Smoking status: Never Smoker  . Smokeless tobacco: Never Used  Substance Use Topics  . Alcohol use: No    Alcohol/week: 0.0 standard drinks    Current Outpatient Medications:  .  ALPRAZolam (XANAX) 0.5 MG tablet, TAKE 1/2 TO 1 TABLET BY MOUTH AS NEEDED FOR SLEEP OR ANXIETY, Disp: 45 tablet, Rfl: 1 .  blood glucose meter  kit and supplies KIT, Dispense based on patient and insurance preference. Use up to four times daily as directed. (FOR ICD-9 250.00, 250.01)., Disp: 1 each, Rfl: 0 .  buPROPion (WELLBUTRIN XL) 300 MG 24 hr tablet, Take 1 tablet (300 mg total) by mouth daily., Disp: 90 tablet, Rfl: 4 .  Cyanocobalamin (B-12) 2500 MCG SUBL, Place 2,500 mcg under the tongue daily., Disp: 30 tablet, Rfl: 11 .  desvenlafaxine (PRISTIQ) 100 MG 24 hr tablet, Take 1 tablet (100 mg total) by mouth daily., Disp: 90 tablet, Rfl: 4 .  ergocalciferol (VITAMIN D2) 1.25 MG (50000 UT) capsule, Take 1 capsule (50,000 Units total) by mouth 2 (two) times a week., Disp: 16 capsule, Rfl: 6 .  estradiol (VIVELLE-DOT) 0.1 MG/24HR patch, Place 1 patch (0.1 mg total) onto the skin 2 (two) times a week., Disp: 24 patch, Rfl: 4 .  fluticasone (FLONASE) 50 MCG/ACT nasal spray, PLACE 2 SPRAYS INTO THE NOSE DAILY., Disp: 16 g, Rfl: 3 .  Multiple Vitamins-Minerals (MULTIVITAMIN PO), Take 1 tablet by mouth 2 (two) times a week. , Disp: , Rfl:  .  promethazine (PHENERGAN) 25 MG tablet, Take 1 tablet (25 mg total) by mouth every 8 (eight) hours as needed for nausea., Disp: 30 tablet, Rfl: 0 .  rizatriptan (MAXALT) 10 MG tablet, Take 1 tablet earliest onset of migraine.  May repeat once in 2 hours if needed, Disp: 30 tablet, Rfl: 4 .  spironolactone (ALDACTONE) 100 MG tablet, Take 1 tablet (100 mg total) by mouth 2 (two) times daily., Disp: 180 tablet, Rfl: 4 .  topiramate ER (QUDEXY XR)  150 MG CS24 sprinkle capsule, Take 1 capsule (150 mg total) by mouth daily., Disp: 90 each, Rfl: 3 .  traZODone (DESYREL) 50 MG tablet, Take 0.5-1 tablets (25-50 mg total) by mouth at bedtime as needed for sleep., Disp: 30 tablet, Rfl: 3  Current Facility-Administered Medications:  .  diphenhydrAMINE (BENADRYL) injection 50 mg, 50 mg, Intravenous, Once, Briscoe Deutscher, DO .  famotidine (PEPCID) 20 mg in sodium chloride 0.9 % 50 mL IVPB, 20 mg, Intravenous, Once,  Briscoe Deutscher, DO  Facility-Administered Medications Ordered in Other Visits:  .  0.9 %  sodium chloride infusion, , Intravenous, PRN, Briscoe Deutscher, DO .  ferumoxytol [facility]) 510 mg in sodium chloride 0.9 % 100 mL IVPB, 510 mg, Intravenous, Once, Briscoe Deutscher, DO  No Known Allergies  Objective:   VITALS: Per patient if applicable, see vitals. GENERAL: Alert, appears well and in no acute distress. HEENT: Atraumatic, conjunctiva clear, no obvious abnormalities on inspection of external nose and ears. NECK: Normal movements of the head and neck. CARDIOPULMONARY: No increased WOB. Speaking in clear sentences. I:E ratio WNL.  MS: Moves all visible extremities without noticeable abnormality. PSYCH: Pleasant and cooperative, well-groomed. Speech normal rate and rhythm. Affect is appropriate. Insight and judgement are appropriate. Attention is focused, linear, and appropriate.  NEURO: CN grossly intact. Oriented as arrived to appointment on time with no prompting. Moves both UE equally.  SKIN: No obvious lesions, wounds, erythema, or cyanosis noted on face or hands.  No flowsheet data found.  Assessment and Plan:   There are no diagnoses linked to this encounter.  Marland Kitchen COVID-19 Education: The signs and symptoms of COVID-19 were discussed with the patient and how to seek care for testing if needed. The importance of social distancing was discussed today. . Reviewed expectations re: course of current medical issues. . Discussed self-management of symptoms. . Outlined signs and symptoms indicating need for more acute intervention. . Patient verbalized understanding and all questions were answered. Marland Kitchen Health Maintenance issues including appropriate healthy diet, exercise, and smoking avoidance were discussed with patient. . See orders for this visit as documented in the electronic medical record.  Francella Solian, CMA  Records requested if needed. Time spent: *** minutes, of which >50%  was spent in obtaining information about her symptoms, reviewing her previous labs, evaluations, and treatments, counseling her about her condition (please see the discussed topics above), and developing a plan to further investigate it; she had a number of questions which I addressed.

## 2019-02-22 NOTE — Telephone Encounter (Signed)
Checked with Dr. Juleen China ok to order both have called and let Melvina know we are putting order in now.

## 2019-02-22 NOTE — Telephone Encounter (Signed)
Called back order not received fax [phone]

## 2019-02-22 NOTE — Telephone Encounter (Signed)
Trilby Drummer RN at   [facility] called to report that pt is having a reaction to the iron infusion. Pt was given pre medication of IV Pepcid and Benadryl but during the infusion, pt began to have a dry irritated throat and a dry cough. Spoke with pt who stated that her cough is not bad and water has helped somewhat. Pt denies SOB, tongue or lip swelling.  Called Dr Larose Kells and informed him of above information and had a 3 way conversation with Infusion center and Dr Larose Kells. Dr Larose Kells gave RN at center phone order for Solumedrol  125 mg and for pt to take 25 mg Benadryl. (see Dr Ethel Rana note).   Answer Assessment - Initial Assessment Questions 1. MAIN SYMPTOM: "What is your main symptom?" "How bad is it?"       Irritation and dryness to the back of throat, dry cough 2. RESPIRATORY STATUS: "Are you having difficulty breathing?"  (e.g., yes/no, wheezing, unable to complete a sentence)      no 3. SWALLOWING: "Can you swallow?" (e.g., yes/no, food, fluid, saliva)      yes 4. VASCULAR STATUS: "Are you feeling weak?" If so, ask: "Can you stand and walk normally?"     no 5. ONSET: "When did the reaction start?" (Minutes or hours ago)     During the iron infusion 6. SUBSTANCE: "What are you reacting to?" "When did the contact occur?"      IV ferritin infusion 7. PREVIOUS REACTION: "Have you ever reacted to it before?" If so, ask: "What happened that time?"     Yes- pt was given benadryl, Pepcid and Solumedrol-pt was having itching and dry cough 8. EPINEPHRINE: "Do you have an epinephrine autoinjector (e.g., EpiPen)?"     Yes  Protocols used: ANAPHYLAXIS-A-AH

## 2019-02-22 NOTE — Discharge Instructions (Signed)

## 2019-02-22 NOTE — Telephone Encounter (Signed)
See note  Copied from [place] [id number]. Topic: General - Inquiry >> Feb 22, 2019  1:28 PM Scherrie Gerlach wrote: Reason for CRM: Melvina at the [facility] calling to advise the pt is there for her Feraheme infusion.  However , pt told Melvina last time she had the infusion, she had a reaction, so they gave her benedryl injection, and pepcid IV.  Pt thinks she is supposed to get that again today, but there are no orders. Melvina can be reached at [phone]

## 2019-02-23 ENCOUNTER — Telehealth: Payer: Self-pay | Admitting: *Deleted

## 2019-02-23 ENCOUNTER — Encounter: Payer: Self-pay | Admitting: Family Medicine

## 2019-02-23 MED ORDER — PROMETHAZINE HCL 12.5 MG PO TABS: ORAL_TABLET | ORAL | 1 refills | Status: DC

## 2019-02-23 NOTE — Telephone Encounter (Signed)
Patient request refill of phenergan to [facility].   Refill request reviewed with Dr. Talbert Nan.  Rx for Phenergan 12.5 mg tab take 1-2 tabs PO q4-6 hrs PRN for nausea/vomitting #20/1RF to pharmacy.    Patient notified of refill.   Routing to provider for final review. Patient is agreeable to disposition. Will close encounter.

## 2019-02-23 NOTE — Telephone Encounter (Signed)
Called and LM for pt to either call us back and let us know how she's feeling or to send a MyChart message w/ a status update.

## 2019-02-23 NOTE — Telephone Encounter (Signed)
Please check in on patient today.

## 2019-02-23 NOTE — Telephone Encounter (Signed)
See note

## 2019-02-27 NOTE — Telephone Encounter (Signed)
Will wait for Dr. Juleen China to advise.

## 2019-02-27 NOTE — Telephone Encounter (Signed)
Called pt again and she thanks [place] for the follow-up. Regarding her side effects from her iron infusion, pt states that once she was given the Solu-Medrol, her symptoms subsided within about 30 min and that she continues to do well.  Pt states that she sent a MyChart message with several questions that she would like addressed.   1) Discussion was had at her visit prior to the iron infusion about her having 2 iron infusions.  Is she still supposed to get a 2nd iron infusion?   2) Her pre-meds were not ordered in advance of her infusion so wanted to make sure her pre-meds are ordered in advance of 2nd infusion if she's supposed to get one.   3) If she's supposed to get a 2nd iron infusion, suggestion was made that perhaps a different type of iron infusion be ordered?? and was asking that that option be looked into.  4) Also wanted to know if she is supposed to get labs before her next f/u with Dr. Juleen China and if so, if someone would put orders in for those.   5) Does Dr. Juleen China still want to f/u with her in approximately one month?  Please advise regarding pt's questions or can respond to her MyChart message.  Thanks, [employer]

## 2019-02-27 NOTE — Telephone Encounter (Signed)
Please connect with the infusion center and see how we can manage these more efficiently. I KNOW that they have a written orders sheet. Let me know what you find. FYI - the other think that we can do is refer to Hematology to manage. I would still like to get it together with them, though. Please see if Lorenda Peck can help [place].

## 2019-02-28 NOTE — Telephone Encounter (Signed)
Dr. Juleen China,  Pt mentioned repeat/future labs? No orders in Epic.  She also wanted to know when she should f/u with you again?  Please advise, thanks!

## 2019-02-28 NOTE — Telephone Encounter (Signed)
Spoke to [place] about this and she would like to call the infusion center herself to follow-up on pt's questions/concerns.

## 2019-02-28 NOTE — Telephone Encounter (Signed)
Called Patient Judith Holland. She will fax over written order sheet. Will need to place new order if med is changed from [employer]. Will need place sign and hold/standing order for pre-meds Benadryl, Pepcid, and Solu-Medrol. I will work on this once order sheet is received.

## 2019-02-28 NOTE — Telephone Encounter (Signed)
Called Patient Judith Holland, no answer/no VM. Line disconnected.

## 2019-03-02 NOTE — Telephone Encounter (Signed)
Sending to Butch Penny since she has been working on this. Infusion should be x 2, 1 week apart.   We need to add premedication orders. Try to schedule repeat infusion in the am. Ask the infusion center which iron options we have for ordering. I do want to see her in 1 month.

## 2019-03-03 ENCOUNTER — Other Ambulatory Visit: Payer: Self-pay

## 2019-03-03 DIAGNOSIS — G4489 Other headache syndrome: Secondary | ICD-10-CM

## 2019-03-03 DIAGNOSIS — K909 Intestinal malabsorption, unspecified: Secondary | ICD-10-CM

## 2019-03-03 DIAGNOSIS — D508 Other iron deficiency anemias: Secondary | ICD-10-CM

## 2019-03-03 NOTE — Telephone Encounter (Signed)
Order placed and confirmed with nurse. Called pt and advised, provided appointment date/time/location and number to contact if she needs to r/s. Scheduled f/u appt with Dr. Juleen China in 1 month. Also placed new order for MRI brain w/ w/o CM because authorization for prior order had expired and been closed.

## 2019-03-03 NOTE — Telephone Encounter (Signed)
Pt scheduled for 03/08/19 at 8:00 AM. Will check with RN to make sure meds were entered correctly and then contact pt.

## 2019-03-03 NOTE — Telephone Encounter (Signed)
Called Patient Judith Holland, they offer Feraheme and Injectafer.

## 2019-03-07 ENCOUNTER — Ambulatory Visit (HOSPITAL_COMMUNITY): Admission: RE | Admit: 2019-03-07 | Payer: 59 | Source: Ambulatory Visit

## 2019-03-08 ENCOUNTER — Other Ambulatory Visit: Payer: Self-pay

## 2019-03-08 ENCOUNTER — Inpatient Hospital Stay (HOSPITAL_COMMUNITY): Admission: RE | Admit: 2019-03-08 | Payer: 59 | Source: Ambulatory Visit

## 2019-03-09 ENCOUNTER — Ambulatory Visit (HOSPITAL_COMMUNITY)
Admission: RE | Admit: 2019-03-09 | Discharge: 2019-03-09 | Disposition: A | Discharge status: Home / Self Care | Payer: 59 | Source: Ambulatory Visit | Attending: Family Medicine | Admitting: Family Medicine

## 2019-03-09 DIAGNOSIS — D508 Other iron deficiency anemias: Secondary | ICD-10-CM | POA: Diagnosis not present

## 2019-03-09 DIAGNOSIS — K909 Intestinal malabsorption, unspecified: Secondary | ICD-10-CM | POA: Diagnosis not present

## 2019-03-09 DIAGNOSIS — G4489 Other headache syndrome: Secondary | ICD-10-CM | POA: Diagnosis not present

## 2019-03-09 MED ORDER — SODIUM CHLORIDE 0.9 % IV SOLN
750.0000 mg | Freq: Once | INTRAVENOUS | Status: AC
  Administered 2019-03-09: 750 mg via INTRAVENOUS
  Filled 2019-03-09: qty 15

## 2019-03-09 MED ORDER — METHYLPREDNISOLONE SODIUM SUCC 125 MG IJ SOLR: 125.0000 mg | Freq: Once | INTRAMUSCULAR | Status: DC | PRN

## 2019-03-09 MED ORDER — SODIUM CHLORIDE 0.9 % IV SOLN: INTRAVENOUS | Status: DC | PRN

## 2019-03-09 MED ORDER — SODIUM CHLORIDE 0.9 % IV SOLN
50.0000 mg | Freq: Once | INTRAVENOUS | Status: DC
  Filled 2019-03-09: qty 1

## 2019-03-09 MED ORDER — SODIUM CHLORIDE 0.9% IV SOLUTION: Freq: Once | INTRAVENOUS | Status: DC

## 2019-03-09 MED ORDER — METHYLPREDNISOLONE SODIUM SUCC 125 MG IJ SOLR
INTRAMUSCULAR | Status: AC
  Filled 2019-03-09: qty 2

## 2019-03-09 MED ORDER — DIPHENHYDRAMINE HCL 50 MG/ML IJ SOLN
INTRAMUSCULAR | Status: AC
  Administered 2019-03-09: 50 mg via INTRAMUSCULAR
  Filled 2019-03-09: qty 1

## 2019-03-09 MED ORDER — SODIUM CHLORIDE 0.9 % IV SOLN
40.0000 mg | Freq: Once | INTRAVENOUS | Status: AC
  Administered 2019-03-09: 14:00:00 40 mg via INTRAVENOUS
  Filled 2019-03-09: qty 4

## 2019-03-09 NOTE — Discharge Instructions (Signed)

## 2019-03-10 ENCOUNTER — Ambulatory Visit (HOSPITAL_COMMUNITY)
Admission: RE | Admit: 2019-03-10 | Discharge: 2019-03-10 | Disposition: A | Discharge status: Home / Self Care | Payer: 59 | Source: Ambulatory Visit | Attending: Family Medicine | Admitting: Family Medicine

## 2019-03-10 ENCOUNTER — Other Ambulatory Visit: Payer: Self-pay

## 2019-03-10 DIAGNOSIS — D508 Other iron deficiency anemias: Secondary | ICD-10-CM | POA: Diagnosis not present

## 2019-03-10 DIAGNOSIS — G4489 Other headache syndrome: Secondary | ICD-10-CM | POA: Diagnosis not present

## 2019-03-10 DIAGNOSIS — K909 Intestinal malabsorption, unspecified: Secondary | ICD-10-CM | POA: Diagnosis not present

## 2019-03-10 DIAGNOSIS — R413 Other amnesia: Secondary | ICD-10-CM | POA: Diagnosis not present

## 2019-03-10 MED ORDER — GADOBUTROL 1 MMOL/ML IV SOLN
10.0000 mL | Freq: Once | INTRAVENOUS | Status: AC | PRN
  Administered 2019-03-10: 10 mL via INTRAVENOUS

## 2019-03-13 ENCOUNTER — Encounter: Payer: Self-pay | Admitting: Family Medicine

## 2019-03-13 DIAGNOSIS — G4489 Other headache syndrome: Secondary | ICD-10-CM

## 2019-03-13 DIAGNOSIS — R930 Abnormal findings on diagnostic imaging of skull and head, not elsewhere classified: Secondary | ICD-10-CM

## 2019-03-14 ENCOUNTER — Telehealth: Payer: Self-pay | Admitting: Family Medicine

## 2019-03-14 NOTE — Telephone Encounter (Signed)
Judith Holland spoke to patient and explained that the referral had been sent and that someone should be contacting her to schedule an appointment for this. Patient stated that she would wait for the call.

## 2019-03-14 NOTE — Telephone Encounter (Signed)
Copied from [place] [id number]. Topic: General - Inquiry >> Mar 14, 2019  9:33 AM Mathis Bud wrote: Reason for CRM: Patient called stating wants to schedule her lumbar puncture shot. Patient could not respond on mychart. Patient would like to be called or leave message on mychart.  [phone] back line ( ask for [place]ak[place]) Cell: [phone]

## 2019-03-17 MED ORDER — ALPRAZOLAM 0.5 MG PO TABS: ORAL_TABLET | ORAL | 1 refills | Status: DC

## 2019-03-17 NOTE — Addendum Note (Signed)
Addended by: Francella Solian on: 03/17/2019 09:51 AM   Modules accepted: Orders

## 2019-03-17 NOTE — Telephone Encounter (Signed)
Refill pulled up please advise

## 2019-03-21 ENCOUNTER — Ambulatory Visit
Admission: RE | Admit: 2019-03-21 | Discharge: 2019-03-21 | Disposition: A | Discharge status: Home / Self Care | Payer: 59 | Source: Ambulatory Visit | Attending: Family Medicine | Admitting: Family Medicine

## 2019-03-21 ENCOUNTER — Other Ambulatory Visit: Payer: Self-pay

## 2019-03-21 VITALS — BP 139/83 | HR 69

## 2019-03-21 DIAGNOSIS — D72819 Decreased white blood cell count, unspecified: Secondary | ICD-10-CM

## 2019-03-21 DIAGNOSIS — G43909 Migraine, unspecified, not intractable, without status migrainosus: Secondary | ICD-10-CM | POA: Diagnosis not present

## 2019-03-21 DIAGNOSIS — R51 Headache: Secondary | ICD-10-CM | POA: Diagnosis not present

## 2019-03-21 DIAGNOSIS — G4489 Other headache syndrome: Secondary | ICD-10-CM

## 2019-03-21 MED ORDER — DIAZEPAM 5 MG PO TABS
5.0000 mg | ORAL_TABLET | Freq: Once | ORAL | Status: AC
  Administered 2019-03-21: 5 mg via ORAL

## 2019-03-21 NOTE — Discharge Instructions (Signed)

## 2019-03-22 ENCOUNTER — Ambulatory Visit (INDEPENDENT_AMBULATORY_CARE_PROVIDER_SITE_OTHER): Payer: 59 | Admitting: Family Medicine

## 2019-03-22 ENCOUNTER — Other Ambulatory Visit: Payer: Self-pay

## 2019-03-22 ENCOUNTER — Encounter: Payer: Self-pay | Admitting: Family Medicine

## 2019-03-22 VITALS — Ht 67.0 in | Wt 240.0 lb

## 2019-03-22 DIAGNOSIS — G4489 Other headache syndrome: Secondary | ICD-10-CM | POA: Diagnosis not present

## 2019-03-22 DIAGNOSIS — R930 Abnormal findings on diagnostic imaging of skull and head, not elsewhere classified: Secondary | ICD-10-CM

## 2019-03-22 NOTE — Progress Notes (Signed)
Virtual Visit via Video   Due to the COVID-19 pandemic, this visit was completed with telemedicine (audio/video) technology to reduce patient and provider exposure as well as to preserve personal protective equipment.   I connected with Huey Romans by a video enabled telemedicine application and verified that I am speaking with the correct person using two identifiers. Location patient: Home Location provider: [facility], Office Persons participating in the virtual visit: KINGA CASSAR, Briscoe Deutscher, DO Lonell Grandchild, CMA acting as scribe for Dr. Briscoe Deutscher.   I discussed the limitations of evaluation and management by telemedicine and the availability of in person appointments. The patient expressed understanding and agreed to proceed.  Care Team   Patient Care Team: Briscoe Deutscher, DO as PCP - General (Family Medicine)  Subjective:   HPI: Following up on lumbar and MRI. She would like to know what the next steps are after the abnormal MRI.  Mr Jeri Cos Wo Contrast  Result Date: 03/11/2019 CLINICAL DATA:  Chronic headaches. Memory issues. History of endometrial cancer. EXAM: MRI HEAD WITHOUT AND WITH CONTRAST TECHNIQUE: Multiplanar, multiecho pulse sequences of the brain and surrounding structures were obtained without and with intravenous contrast. CONTRAST:  10 mL Gadavist COMPARISON:  None. FINDINGS: Brain: There is no evidence of acute infarct, intracranial hemorrhage, mass, midline shift, or extra-axial fluid collection. The ventricles and sulci are normal. Small foci of T2 hyperintensity are scattered throughout the juxtacortical, deep, and periventricular white matter bilaterally, mildly advanced for age. There is no involvement of the corpus callosum or brainstem. There may be a punctate chronic infarct in the right cerebellar hemisphere. No abnormal enhancement is identified. A non expanded partially empty sella is noted. Vascular: Major intracranial vascular flow  voids are preserved. Skull and upper cervical spine: Unremarkable bone marrow signal. Sinuses/Orbits: Dilated optic nerve sheaths, left greater than right. Small volume fluid in the left maxillary sinus. Clear mastoid air cells. Other: None. IMPRESSION: 1. No acute intracranial abnormality or mass. 2. Mildly age advanced cerebral white matter T2 signal changes, nonspecific. Considerations include chronic small vessel ischemic disease, migraines, vasculitis, hypercoagulable state, demyelinating disease, and prior infection/inflammation. 3. Partially empty sella and dilated optic nerve sheaths which can be seen with idiopathic intracranial hypertension. Consider lumbar puncture to assess opening pressure. Electronically Signed   By: Logan Bores M.D.   On: 03/11/2019 20:36   Dg Inject Diag/thera/inc Needle/cath/plc Epi/lumb/sac W/img  Result Date: 03/24/2019 CLINICAL DATA:  Lumbar puncture 3 days ago. Severe positional headache. FLUOROSCOPY TIME:  0 minutes 33 seconds. 39.77 micro gray meter squared PROCEDURE: EPIDURAL BLOOD PATCH The procedure, risks, benefits, and alternatives were explained to the patient. Questions regarding the procedure were encouraged and answered. The patient understands and consents to the procedure. 15 cc of blood were withdrawn from the patient's right antecubital fossa by myself. An epidural approach was taken on right at L2-3 using a 20 gauge spinal needle. Epidural positioning was confirmed by injecting a small amount of Isovue 200. There was no vascular communication. 15 cc of the patient's blood was slowly injected into the epidural space in this location. The procedure was well-tolerated and she was discharged in good condition with instructions to lie down for additional day. COMPLICATIONS: None IMPRESSION: Lumbar epidural blood patch on the right at L2-3. Electronically Signed   By: Nelson Chimes M.D.   On: 03/24/2019 15:08   Dg Fl Guided Lumbar Puncture  Result Date:  03/21/2019 CLINICAL DATA:  Chronic headaches.  Worsening forgetfulness.  EXAM: DIAGNOSTIC LUMBAR PUNCTURE UNDER FLUOROSCOPIC GUIDANCE FLUOROSCOPY TIME:  Fluoroscopy Time:  1 second Radiation Exposure Index (if provided by the fluoroscopic device): 1.4 mGy Number of Acquired Spot Images: 0 PROCEDURE: Informed consent was obtained from the patient prior to the procedure, including potential complications of headache, allergy, and pain. With the patient prone, the lower back was prepped with Betadine. 1% Lidocaine was used for local anesthesia. Lumbar puncture was performed at the L2-L3 level using a 3.5 inch 20 gauge needle with return of clear, colorless CSF with an opening pressure of 8 cm water. 4 ml of CSF were obtained for laboratory studies. Closing pressure was 12 cm water. The patient tolerated the procedure well and there were no apparent complications. IMPRESSION: 1. Technically successful fluoroscopically guided lumbar puncture. 2. Normal opening CSF pressure. Electronically Signed   By: Titus Dubin M.D.   On: 03/21/2019 13:26   Results for orders placed or performed during the hospital encounter of 03/21/19  CSF culture  Result Value Ref Range   MICRO NUMBER: [id number]    SPECIMEN QUALITY: Adequate    Source CEREBROSPINAL FLUID    STATUS: FINAL    GRAM STAIN:      Few Polymorphonuclear leukocytes No organisms seen No epithelial cells seen   Result: No Growth   Protein, CSF  Result Value Ref Range   Total Protein, CSF 35 15 - 45 mg/dL  Glucose, CSF  Result Value Ref Range   Glucose, CSF 56 40 - 80 mg/dL  CSF cell count with differential  Result Value Ref Range   Color, CSF COLORLESS COLORLESS   Appearance, CSF CLEAR CLEAR   RBC Count, CSF 23 (H) 0 - 10 cells/uL   WBC, CSF 1 0 - 5 cells/uL   Segmented Neutrophils-CSF CANCELED    Comment:     BP Readings from Last 3 Encounters:  03/24/19 140/84  03/21/19 139/83  03/09/19 118/77   Lab Results  Component Value Date   ALT 14  12/09/2018   AST 20 12/09/2018   ALKPHOS 61 12/09/2018   BILITOT 0.5 12/09/2018   Lab Results  Component Value Date   VITAMINB12 409 12/19/2018   Lab Results  Component Value Date   IRON 122 12/09/2018   TIBC 399 09/13/2018   FERRITIN 5.1 (L) 12/09/2018   Lab Results  Component Value Date   WBC 3.4 (L) 12/09/2018   HGB 13.8 12/09/2018   HCT 41.1 12/09/2018   MCV 94.0 12/09/2018   PLT 184.0 12/09/2018   Lab Results  Component Value Date   CREATININE 0.91 12/09/2018   Lab Results  Component Value Date   NA 141 12/09/2018   K 4.0 12/09/2018   CL 110 12/09/2018   CO2 25 12/09/2018   Review of Systems  Constitutional: Negative for chills and fever.  HENT: Negative for hearing loss and tinnitus.   Eyes: Negative for blurred vision and double vision.  Respiratory: Negative for cough.   Cardiovascular: Negative for chest pain and palpitations.  Gastrointestinal: Negative for nausea and vomiting.  Genitourinary: Negative for dysuria, frequency and urgency.  Neurological: Positive for dizziness and headaches.  Endo/Heme/Allergies: Does not bruise/bleed easily.  Psychiatric/Behavioral: Negative for depression and suicidal ideas.    Patient Active Problem List   Diagnosis Date Noted  . Primary insomnia 12/11/2018  . Bilateral hearing loss 12/11/2018  . Migraines 12/11/2018  . Family history of early CAD 12/11/2018  . Morbid obesity ([place]), BMI 37 plus comorbid conditions 12/11/2018  . Leukopenia 12/11/2018  .  Hypoglycemic reaction 12/11/2018  . Cervicogenic headache 12/11/2018  . IDA (iron deficiency anemia), requiring iron infusions 09/22/2017  . Malabsorption of iron 09/22/2017  . Stress incontinence 09/11/2015  . 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 Endometrial adenocarcinoma [facility]), s/p radical hysterectomy with BSO and lymph node biopsy 02/10/2012  . Allergic rhinitis 10/02/2007    Social History   Tobacco  Use  . Smoking status: Never Smoker  . Smokeless tobacco: Never Used  Substance Use Topics  . Alcohol use: No    Alcohol/week: 0.0 standard drinks   Current Outpatient Medications:  .  ALPRAZolam (XANAX) 0.5 MG tablet, TAKE 1/2 TO 1 TABLET BY MOUTH AS NEEDED FOR SLEEP OR ANXIETY, Disp: 45 tablet, Rfl: 1 .  blood glucose meter kit and supplies KIT, Dispense based on patient and insurance preference. Use up to four times daily as directed. (FOR ICD-9 250.00, 250.01)., Disp: 1 each, Rfl: 0 .  buPROPion (WELLBUTRIN XL) 300 MG 24 hr tablet, Take 1 tablet (300 mg total) by mouth daily., Disp: 90 tablet, Rfl: 4 .  Cyanocobalamin (B-12) 2500 MCG SUBL, Place 2,500 mcg under the tongue daily., Disp: 30 tablet, Rfl: 11 .  desvenlafaxine (PRISTIQ) 100 MG 24 hr tablet, Take 1 tablet (100 mg total) by mouth daily., Disp: 90 tablet, Rfl: 4 .  ergocalciferol (VITAMIN D2) 1.25 MG (50000 UT) capsule, Take 1 capsule (50,000 Units total) by mouth 2 (two) times a week., Disp: 16 capsule, Rfl: 6 .  estradiol (VIVELLE-DOT) 0.1 MG/24HR patch, Place 1 patch (0.1 mg total) onto the skin 2 (two) times a week., Disp: 24 patch, Rfl: 4 .  Eszopiclone (ESZOPICLONE) 3 MG TABS, Take 3 mg by mouth at bedtime. Take immediately before bedtime, Disp: , Rfl:  .  fluticasone (FLONASE) 50 MCG/ACT nasal spray, PLACE 2 SPRAYS INTO THE NOSE DAILY., Disp: 16 g, Rfl: 3 .  Multiple Vitamins-Minerals (MULTIVITAMIN PO), Take 1 tablet by mouth 2 (two) times a week. , Disp: , Rfl:  .  promethazine (PHENERGAN) 12.5 MG tablet, Take 1-2 tabs PO q4-6 hours prn for nausea and/or vomiting., Disp: 20 tablet, Rfl: 1 .  rizatriptan (MAXALT) 10 MG tablet, Take 1 tablet earliest onset of migraine.  May repeat once in 2 hours if needed, Disp: 30 tablet, Rfl: 4 .  spironolactone (ALDACTONE) 100 MG tablet, Take 1 tablet (100 mg total) by mouth 2 (two) times daily., Disp: 180 tablet, Rfl: 4 .  topiramate ER (QUDEXY XR) 150 MG CS24 sprinkle capsule, Take 1  capsule (150 mg total) by mouth daily., Disp: 90 each, Rfl: 3  No Known Allergies  Objective:   VITALS: Per patient if applicable, see vitals. GENERAL: Alert, appears well and in no acute distress. HEENT: Atraumatic, conjunctiva clear, no obvious abnormalities on inspection of external nose and ears. NECK: Normal movements of the head and neck. CARDIOPULMONARY: No increased WOB. Speaking in clear sentences. I:E ratio WNL.  MS: Moves all visible extremities without noticeable abnormality. PSYCH: Pleasant and cooperative, well-groomed. Speech normal rate and rhythm. Affect is appropriate. Insight and judgement are appropriate. Attention is focused, linear, and appropriate.  NEURO: CN grossly intact. Oriented as arrived to appointment on time with no prompting. Moves both UE equally.  SKIN: No obvious lesions, wounds, erythema, or cyanosis noted on face or hands.  Assessment and Plan:   Saran was seen today for follow-up.  Diagnoses and all orders for this visit:  Abnormal MRI of head Comments:  Partially empty sella and dilated optic nerve sheath. Normal LP.  Orders: -     Ambulatory referral to Neurology -     Ambulatory referral to Ophthalmology  Other headache syndrome -     Ambulatory referral to Neurology   . COVID-19 Education: The signs and symptoms of COVID-19 were discussed with the patient and how to seek care for testing if needed. The importance of social distancing was discussed today. . Reviewed expectations re: course of current medical issues. . Discussed self-management of symptoms. . Outlined signs and symptoms indicating need for more acute intervention. . Patient verbalized understanding and all questions were answered. Marland Kitchen Health Maintenance issues including appropriate healthy diet, exercise, and smoking avoidance were discussed with patient. . See orders for this visit as documented in the electronic medical record.  Briscoe Deutscher, DO  Records requested if  needed. Time spent: 25 minutes, of which >50% was spent in obtaining information about her symptoms, reviewing her previous labs, evaluations, and treatments, counseling her about her condition (please see the discussed topics above), and developing a plan to further investigate it; she had a number of questions which I addressed.

## 2019-03-24 ENCOUNTER — Telehealth: Payer: Self-pay | Admitting: Family Medicine

## 2019-03-24 ENCOUNTER — Ambulatory Visit
Admission: RE | Admit: 2019-03-24 | Discharge: 2019-03-24 | Disposition: A | Discharge status: Home / Self Care | Payer: 59 | Source: Ambulatory Visit | Attending: Family Medicine | Admitting: Family Medicine

## 2019-03-24 ENCOUNTER — Encounter: Payer: Self-pay | Admitting: Family Medicine

## 2019-03-24 ENCOUNTER — Other Ambulatory Visit: Payer: Self-pay

## 2019-03-24 DIAGNOSIS — R519 Headache, unspecified: Secondary | ICD-10-CM

## 2019-03-24 DIAGNOSIS — R51 Headache: Secondary | ICD-10-CM | POA: Diagnosis not present

## 2019-03-24 DIAGNOSIS — H40023 Open angle with borderline findings, high risk, bilateral: Secondary | ICD-10-CM | POA: Diagnosis not present

## 2019-03-24 DIAGNOSIS — H2513 Age-related nuclear cataract, bilateral: Secondary | ICD-10-CM | POA: Diagnosis not present

## 2019-03-24 MED ORDER — IOPAMIDOL (ISOVUE-M 200) INJECTION 41%
1.0000 mL | Freq: Once | INTRAMUSCULAR | Status: AC
  Administered 2019-03-24: 1 mL via EPIDURAL

## 2019-03-24 NOTE — Telephone Encounter (Signed)
See note, patient was just seen 6/3  Copied from [place] [id number]. Topic: General - Other >> Mar 24, 2019  8:26 AM Leward Quan A wrote: Reason for CRM: Patient called to say that she had a lumbar puncture on Monday and was told that if she develop a headache there are some measures to be taken. She states that she have taken these measures twice but nothing has helped so she is asking for a call from [place] to know what to do next. Ph# [phone]

## 2019-03-24 NOTE — Discharge Instructions (Signed)

## 2019-03-24 NOTE — Telephone Encounter (Signed)
Order placed in epic and faxed

## 2019-03-24 NOTE — Telephone Encounter (Signed)
Patient in formed will get call from them to work in today.

## 2019-03-24 NOTE — Telephone Encounter (Signed)
Need to call Radiology, to see if blood patch needed. Call Radiology now before calling patient with instructions.

## 2019-03-27 ENCOUNTER — Telehealth: Payer: Self-pay

## 2019-03-27 ENCOUNTER — Encounter: Payer: Self-pay | Admitting: Family Medicine

## 2019-03-27 NOTE — Telephone Encounter (Signed)
Spoke with patient, aware of normal LP results and verbalizes understanding.  She wanted to let you know that she has already seen Dr. Jerline Pain at [facility] and he found no swelling in optic nerve.  Also r/c call from Dr. Georgie Chard office and she is scheduled for appt w/him on 6/11.

## 2019-03-29 ENCOUNTER — Encounter: Payer: Self-pay | Admitting: Neurology

## 2019-03-29 NOTE — Progress Notes (Addendum)
Virtual Visit via Video Note The purpose of this virtual visit is to provide medical care while limiting exposure to the novel coronavirus.    Consent was obtained for video visit:  Yes Answered questions that patient had about telehealth interaction:  Yes I discussed the limitations, risks, security and privacy concerns of performing an evaluation and management service by telemedicine. I also discussed with the patient that there may be a patient responsible charge related to this service. The patient expressed understanding and agreed to proceed.  Pt location: Home Physician Location: Home Name of referring provider:  Briscoe Deutscher, DO I connected with Huey Romans at patients initiation/request on 03/30/2019 at  7:50 AM EDT by video enabled telemedicine application and verified that I am speaking with the correct person using two identifiers. Pt MRN:  [id number] Pt DOB:  23-Feb-1966 Video Participants:  Huey Romans   History of Present Illness:  Judith Holland is a 53 year old woman who follows up for headaches and abnormal brain MRI.  UPDATE: Patient last seen in initial consult visit in April 2018.  At that time, I had recommended switching from topiramate IR to ER as it has less cognitive side effects.  At the time, she reported improvement in headaches.  I also prescribed Maxalt.  I had recommended neuropsychological testing.  She was lost to follow up.  She continued to have memory issues and fatigue.  Labs from February and March showed TSH 1.12, B12 409, B1 11 and B2 6.5.  She does have known iron deficiency anemia.  Intensity:  moderate Duration:  Couple of hours Frequency:  Intermittent, she may have daily for 2-3 days in a row and may go a 3 or 4 weeks without  Current NSAIDS:  diclofenac Current analgesics:  no Current triptans:  rizatriptan 58m Current anti-emetic:  promethazine Current muscle relaxants:  no Current anti-anxiolytic:  alprazolam Current sleep  aide:  eszopiclone Current Antihypertensive medications:  spironolactone Current Antidepressant medications:  Bupropion, Pristiq Current Anticonvulsant medications:  Qudexy XR 1537mCurrent Vitamins/Herbal/Supplements:  MVI, D, B12 Current Antihistamines/Decongestants:  Flonase Other therapy:  no Hormone/birth control:  Estradiol  However, she now notes pressure in back of the neck into the shoulders but repetitive movements of neck and arms may aggravate a mild diffuse headache lasting all day and daily.  She can wake up with this headache.  She is not treating these headaches with analgesics.  She denies blurred vision or visual obscurations.  [facility] which did not demonstrate papilledema or other optic nerve involvement.  She notes intermittent "heart beat" in her ears.  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  No abnormal enhancement.  Left greater than right dilated optic nerve sheaths noted, as well as partial empty sella, raising suspicion of idiopathic intracranial hypertension.  She had a lumbar puncture on 03/21/19 which demonstrated a normal opening pressure of 8 cm H2O and closing pressure of 12 cm H2O.  CSF analysis showed cell count 1, protein 35, glucose 56, negative culture and no oligoclonal bands.  Caffeine:  tea Alcohol:  no Smoker:  no Diet:  hydrates Exercise:  no Depression/anxiety:  grieving since passing of her mother in September. Sleep hygiene:  Okay with Lunesta  HISTORY: Onset:  She has had headaches on and off since young adulthood.  They became more frequent  in 2016.  Around that time, she also began experiencing memory deficits as well as poor coordination and vertigo.  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  MRI of brain without contrast from 08/22/15 revealed nonspecific small foci of T2 hyperintensity in the cerebral white matter.  She reportedly had a repeat MRI in November 2017, but a comparison was not made.  MS was considered. As for the headaches: Location:  Above the right eye Quality:  sharp Initial intensity:  7/10 Aura:  no Prodrome:  no Postdrome:  no Associated symptoms:  Photophobia.  Sometimes nausea.  No photophobia or visual disturbance.  She has not had any new worse headache of her life, waking up from sleep Initial Duration:  1 to 2 days Initial Frequency:  Twice a month Triggers:  If she forgets to take her estradiol, fatigue, onset of tension headache. Relieving factors:  Turning lights down Activity:  Cannot function.  She also has tension type headaches that begin at the back of her neck and radiate up to the front, non-throbbing, 3-4/10, lasting up to 3 days and occurring twice a month.  Past NSAIDS:  Cannot take most NSAIDs due to gastric bypass surgery Past analgesics:  no Past abortive triptans:  Sumatriptan tablet, Treximet (side effects) Past muscle relaxants:  no Past anti-emetic:  no Past antihypertensive medications:  no Past antidepressant medications:  no Past anticonvulsant medications:  topiramate 175m Past vitamins/Herbal/Supplements:  no Other past therapies:  no  Family history of headache:  mom  She has reported memory deficits that precede initiation of topiramate.  She has word-finding difficulty.  Sometimes she cannot recall the name of people she knows.  She will sometimes briefly get disoriented while driving.  Past Medical History: Past Medical History:  Diagnosis Date  . Allergy   . Anemia    iron infusion  . Endometrioid  adenocarcinoma    Stage IA grade 1  . Eye pressure    elevated  . Infertility, female    clomid and IUI x 3  . Menorrhagia   . Migraines    migraine headaches  . Morbid obesity ([place]   . Rapid heartbeat   . Varicose veins   . Vertigo     Medications: Outpatient Encounter Medications as of 03/30/2019  Medication Sig Note  . ALPRAZolam (XANAX) 0.5 MG tablet TAKE 1/2 TO 1 TABLET BY MOUTH AS NEEDED FOR SLEEP OR ANXIETY   . blood glucose meter kit and supplies KIT Dispense based on patient and insurance preference. Use up to four times daily as directed. (FOR ICD-9 250.00, 250.01).   .Marland KitchenbuPROPion (WELLBUTRIN XL) 300 MG 24 hr tablet Take 1 tablet (300 mg total) by mouth daily.   . Cyanocobalamin (B-12) 2500 MCG SUBL Place 2,500 mcg under the tongue daily. 12/06/2018: Pt states that she takes several times/week  . desvenlafaxine (PRISTIQ) 100 MG 24 hr tablet Take 1 tablet (100 mg total) by mouth daily.   . ergocalciferol (VITAMIN D2) 1.25 MG (50000 UT) capsule Take 1 capsule (50,000 Units total) by mouth 2 (two) times a week.   . estradiol (VIVELLE-DOT) 0.1 MG/24HR patch Place 1 patch (0.1 mg total) onto the skin 2 (two) times a week.   . Eszopiclone (ESZOPICLONE) 3 MG TABS  Take 3 mg by mouth at bedtime. Take immediately before bedtime   . fluticasone (FLONASE) 50 MCG/ACT nasal spray PLACE 2 SPRAYS INTO THE NOSE DAILY. 12/06/2018: prn  . Multiple Vitamins-Minerals (MULTIVITAMIN PO) Take 1 tablet by mouth 2 (two) times a week.    . promethazine (PHENERGAN) 12.5 MG tablet Take 1-2 tabs PO q4-6 hours prn for nausea and/or vomiting.   . rizatriptan (MAXALT) 10 MG tablet Take 1 tablet earliest onset of migraine.  May repeat once in 2 hours if needed   . spironolactone (ALDACTONE) 100 MG tablet Take 1 tablet (100 mg total) by mouth 2 (two) times daily.   Marland Kitchen topiramate ER (QUDEXY XR) 150 MG CS24 sprinkle capsule Take 1 capsule (150 mg total) by mouth daily.    Facility-Administered Encounter  Medications as of 03/30/2019  Medication  . diphenhydrAMINE (BENADRYL) injection 50 mg  . famotidine (PEPCID) 20 mg in sodium chloride 0.9 % 50 mL IVPB    Allergies: No Known Allergies  Family History: Family History  Problem Relation Age of Onset  . Coronary artery disease Mother   . Heart disease Mother   . Colon cancer Father   . Coronary artery disease Father   . Diabetes Father   . Cancer Father        lung  . Heart disease Father   . Prostate cancer Paternal Grandfather   . Cancer Paternal Grandfather        prostate  . Cancer Maternal Uncle        throat  . Lung cancer Paternal Grandmother   . Cancer Maternal Grandmother        lung    Social History: Social History   Socioeconomic History  . Marital status: Married    Spouse name: Merry Proud  . Number of children: 2  . Years of education: college  . Highest education level: Not on file  Occupational History    Comment: [place] Ne[employer]  . Financial resource strain: Not on file  . Food insecurity:    Worry: Not on file    Inability: Not on file  . Transportation needs:    Medical: Not on file    Non-medical: Not on file  Tobacco Use  . Smoking status: Never Smoker  . Smokeless tobacco: Never Used  Substance and Sexual Activity  . Alcohol use: No    Alcohol/week: 0.0 standard drinks  . Drug use: No  . Sexual activity: Yes    Partners: Male    Birth control/protection: Surgical    Comment: Hyst  Lifestyle  . Physical activity:    Days per week: Not on file    Minutes per session: Not on file  . Stress: Not on file  Relationships  . Social connections:    Talks on phone: Not on file    Gets together: Not on file    Attends religious service: Not on file    Active member of club or organization: Not on file    Attends meetings of clubs or organizations: Not on file    Relationship status: Not on file  . Intimate partner violence:    Fear of current or ex partner: Not on file     Emotionally abused: Not on file    Physically abused: Not on file    Forced sexual activity: Not on file  Other Topics Concern  . Not on file  Social History Narrative   Patient lives at home with her husband Merry Proud). Patient  has two children and works full time for [employer].   Caffeine patient drinks a very mild amount not daily.   Observations/Objective:   There were no vitals filed for this visit. No acute distress.  Alert and oriented.  Speech fluent and not dysarthric.  Language intact.  Face symmetric.    Assessment and Plan:   1.  New frequent headaches.  Likely cervicogenic (given occipital location and neck involvement) and tension-type.  I do not suspect idiopathic intracranial hypertension (or at least active IIH) given the normal opening pressure with normal eye exam.  Qudexy would be a treatment for IIH anyway.  MRI findings may be of no clinical significance.   2.  Migraine without aura, without status migrainosus, not intractable 3.  Abnormal white matter on brain MRI.  Nonspecific findings of uncertain clinical significance.  May be related to migraines.  CSF analysis does not support MS.  I compared the brain MRI from 03/11/19 to the prior ones from 04/18/14 and 08/22/15.  The white matter changes appear relatively stable. 4.  Memory deficits. Precedes topiramate.  1.  For preventative management, continue Qudexi XR 169m daily 2.  For abortive therapy, Maxalt 133m3. Tizanidine 86m17mt bedtime for neck pain 4. Regarding memory deficits, will refer for neuropsychological testing. 5.  Limit use of pain relievers to no more than 2 days out of week to prevent risk of rebound or medication-overuse headache. 6.  Keep headache diary 7.  Exercise, hydration, caffeine cessation, sleep hygiene, monitor for and avoid triggers 8.  Consider:  magnesium citrate 400m67mily, riboflavin 400mg31mly, and coenzyme Q10 100mg 41me times daily 9. Always keep in  mind that currently taking a hormone or birth control may be a possible trigger or aggravating factor for migraine. 10. Follow up 4 months.   Follow Up Instructions:    -I discussed the assessment and treatment plan with the patient. The patient was provided an opportunity to ask questions and all were answered. The patient agreed with the plan and demonstrated an understanding of the instructions.   The patient was advised to call back or seek an in-person evaluation if the symptoms worsen or if the condition fails to improve as anticipated.    RDudley Major

## 2019-03-30 ENCOUNTER — Telehealth (INDEPENDENT_AMBULATORY_CARE_PROVIDER_SITE_OTHER): Payer: 59 | Admitting: Neurology

## 2019-03-30 ENCOUNTER — Encounter: Payer: Self-pay | Admitting: Neurology

## 2019-03-30 ENCOUNTER — Other Ambulatory Visit: Payer: Self-pay

## 2019-03-30 ENCOUNTER — Other Ambulatory Visit: Payer: 59

## 2019-03-30 VITALS — Ht 69.0 in | Wt 239.0 lb

## 2019-03-30 DIAGNOSIS — R413 Other amnesia: Secondary | ICD-10-CM | POA: Diagnosis not present

## 2019-03-30 LAB — CSF CELL COUNT WITH DIFFERENTIAL
RBC Count, CSF: 23 cells/uL — ABNORMAL HIGH (ref 0–10)
WBC, CSF: 1 cells/uL (ref 0–5)

## 2019-03-30 LAB — GLUCOSE, CSF: Glucose, CSF: 56 mg/dL (ref 40–80)

## 2019-03-30 LAB — CSF CULTURE W GRAM STAIN
MICRO NUMBER:: 528330
Result:: NO GROWTH
SPECIMEN QUALITY:: ADEQUATE

## 2019-03-30 LAB — OLIGOCLONAL BANDS, CSF + SERM

## 2019-03-30 LAB — PROTEIN, CSF: Total Protein, CSF: 35 mg/dL (ref 15–45)

## 2019-03-30 MED ORDER — TIZANIDINE HCL 2 MG PO TABS: 2.0000 mg | ORAL_TABLET | Freq: Every day | ORAL | 3 refills | Status: DC

## 2019-03-30 MED FILL — tiZANidine HCL 2 MG TABS: 2 | 30 days supply | Qty: 30 | Fill #0

## 2019-03-31 ENCOUNTER — Encounter: Payer: Self-pay | Admitting: Neurology

## 2019-03-31 MED FILL — ALPRAZolam 0.5 MG TABS: 0.5 | 45 days supply | Qty: 45 | Fill #0

## 2019-04-03 ENCOUNTER — Encounter: Payer: Self-pay | Admitting: Physician Assistant

## 2019-04-03 NOTE — Progress Notes (Signed)
Virtual Visit via Video   Due to the COVID-19 pandemic, this visit was completed with telemedicine (audio/video) technology to reduce patient and provider exposure as well as to preserve personal protective equipment.   I connected with Judith Holland by a video enabled telemedicine application and verified that I am speaking with the correct person using two identifiers. Location patient: Home Location provider: [facility], Office Persons participating in the virtual visit: EVEREST HACKING, Briscoe Deutscher, DO   I discussed the limitations of evaluation and management by telemedicine and the availability of in person appointments. The patient expressed understanding and agreed to proceed.  Care Team   Patient Care Team: Briscoe Deutscher, DO as PCP - General (Family Medicine)  Subjective:   HPI: Patient would like to talk about her appointment with neurology. She has also had two iron infusions from last visit. Reassured by last visit with Neurology. Interested in beta blocker for better management of headaches and prevention of further MRI changes. Feeling a little improvement after iron infusions, but nothing significant. Second infusion experience was much better than the first - completed at [facility] and with premedication order.  Review of Systems  Constitutional: Negative for chills and fever.  HENT: Positive for hearing loss. Negative for tinnitus.        Has app for evaluation on June 30th   Eyes: Negative for blurred vision and double vision.  Respiratory: Negative for cough and hemoptysis.   Cardiovascular: Negative for chest pain, palpitations and leg swelling.  Gastrointestinal: Negative for nausea and vomiting.  Genitourinary: Negative for dysuria, frequency and urgency.  Neurological: Positive for headaches. Negative for dizziness.       Followed by neurology   Endo/Heme/Allergies: Negative for environmental allergies. Does not bruise/bleed easily.  Psychiatric/Behavioral:  Negative for depression and suicidal ideas.    Patient Active Problem List   Diagnosis Date Noted  . At high risk for open-angle glaucoma, bilateral 04/03/2019  . Memory deficits 03/30/2019  . Primary insomnia 12/11/2018  . Bilateral hearing loss 12/11/2018  . Migraines 12/11/2018  . Family history of early CAD 12/11/2018  . Morbid obesity ([place]), BMI 37 plus comorbid conditions 12/11/2018  . Leukopenia 12/11/2018  . Hypoglycemic reaction 12/11/2018  . Cervicogenic headache 12/11/2018  . IDA (iron deficiency anemia), requiring iron infusions 09/22/2017  . Malabsorption of iron 09/22/2017  . Stress incontinence 09/11/2015  . 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 Endometrial adenocarcinoma [facility]), s/p radical hysterectomy with BSO and lymph node biopsy 02/10/2012  . Allergic rhinitis 10/02/2007    Social History   Tobacco Use  . Smoking status: Never Smoker  . Smokeless tobacco: Never Used  Substance Use Topics  . Alcohol use: No    Alcohol/week: 0.0 standard drinks   Current Outpatient Medications:  .  ALPRAZolam (XANAX) 0.5 MG tablet, TAKE 1/2 TO 1 TABLET BY MOUTH AS NEEDED FOR SLEEP OR ANXIETY, Disp: 45 tablet, Rfl: 1 .  blood glucose meter kit and supplies KIT, Dispense based on patient and insurance preference. Use up to four times daily as directed. (FOR ICD-9 250.00, 250.01)., Disp: 1 each, Rfl: 0 .  buPROPion (WELLBUTRIN XL) 300 MG 24 hr tablet, Take 1 tablet (300 mg total) by mouth daily., Disp: 90 tablet, Rfl: 4 .  Cyanocobalamin (B-12) 2500 MCG SUBL, Place 2,500 mcg under the tongue daily., Disp: 30 tablet, Rfl: 11 .  desvenlafaxine (PRISTIQ) 100 MG 24 hr tablet, Take 1 tablet (  100 mg total) by mouth daily., Disp: 90 tablet, Rfl: 4 .  ergocalciferol (VITAMIN D2) 1.25 MG (50000 UT) capsule, Take 1 capsule (50,000 Units total) by mouth 2 (two) times a week., Disp: 16 capsule, Rfl: 6 .  estradiol (VIVELLE-DOT) 0.1 MG/24HR  patch, Place 1 patch (0.1 mg total) onto the skin 2 (two) times a week., Disp: 24 patch, Rfl: 4 .  Eszopiclone (ESZOPICLONE) 3 MG TABS, Take 3 mg by mouth at bedtime. Take immediately before bedtime, Disp: , Rfl:  .  fluticasone (FLONASE) 50 MCG/ACT nasal spray, PLACE 2 SPRAYS INTO THE NOSE DAILY., Disp: 16 g, Rfl: 3 .  Multiple Vitamins-Minerals (MULTIVITAMIN PO), Take 1 tablet by mouth 2 (two) times a week. , Disp: , Rfl:  .  promethazine (PHENERGAN) 12.5 MG tablet, Take 1-2 tabs PO q4-6 hours prn for nausea and/or vomiting., Disp: 20 tablet, Rfl: 1 .  rizatriptan (MAXALT) 10 MG tablet, Take 1 tablet earliest onset of migraine.  May repeat once in 2 hours if needed, Disp: 30 tablet, Rfl: 4 .  spironolactone (ALDACTONE) 100 MG tablet, Take 1 tablet (100 mg total) by mouth 2 (two) times daily., Disp: 180 tablet, Rfl: 4 .  tiZANidine (ZANAFLEX) 2 MG tablet, Take 1 tablet (2 mg total) by mouth at bedtime., Disp: 30 tablet, Rfl: 3 .  topiramate ER (QUDEXY XR) 150 MG CS24 sprinkle capsule, Take 1 capsule (150 mg total) by mouth daily., Disp: 90 each, Rfl: 3 .  propranolol (INDERAL) 20 MG tablet, Take 1 tablet (20 mg total) by mouth 3 (three) times daily., Disp: 90 tablet, Rfl: 1  No Known Allergies  Objective:   VITALS: Per patient if applicable, see vitals. GENERAL: Alert, appears well and in no acute distress. HEENT: Atraumatic, conjunctiva clear, no obvious abnormalities on inspection of external nose and ears. NECK: Normal movements of the head and neck. CARDIOPULMONARY: No increased WOB. Speaking in clear sentences. I:E ratio WNL.  MS: Moves all visible extremities without noticeable abnormality. PSYCH: Pleasant and cooperative, well-groomed. Speech normal rate and rhythm. Affect is appropriate. Insight and judgement are appropriate. Attention is focused, linear, and appropriate.  NEURO: CN grossly intact. Oriented as arrived to appointment on time with no prompting. Moves both UE equally.    SKIN: No obvious lesions, wounds, erythema, or cyanosis noted on face or hands.  Assessment and Plan:   Judith Holland was seen today for follow-up.  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   Marland Kitchen COVID-19 Education: The signs and symptoms of COVID-19 were discussed with the patient and how to seek care for testing if needed. The importance of social distancing was discussed today. . Reviewed expectations re: course of current medical issues. . Discussed self-management of symptoms. . Outlined signs and symptoms indicating need for more acute intervention. . Patient verbalized understanding and all questions were answered. Marland Kitchen Health Maintenance issues including appropriate healthy diet, exercise, and smoking avoidance were  discussed with patient. . See orders for this visit as documented in the electronic medical record.  Briscoe Deutscher, DO  Records requested if needed. Time spent: 25 minutes, of which >50% was spent in obtaining information about her symptoms, reviewing her previous labs, evaluations, and treatments, counseling her about her condition (please see the discussed topics above), and developing a plan to further investigate it; she had a number of questions which I addressed.

## 2019-04-04 ENCOUNTER — Other Ambulatory Visit: Payer: Self-pay

## 2019-04-04 ENCOUNTER — Encounter: Payer: Self-pay | Admitting: Family Medicine

## 2019-04-04 ENCOUNTER — Ambulatory Visit (INDEPENDENT_AMBULATORY_CARE_PROVIDER_SITE_OTHER): Payer: 59 | Admitting: Family Medicine

## 2019-04-04 VITALS — Ht 69.0 in | Wt 239.0 lb

## 2019-04-04 DIAGNOSIS — K909 Intestinal malabsorption, unspecified: Secondary | ICD-10-CM | POA: Diagnosis not present

## 2019-04-04 DIAGNOSIS — G43909 Migraine, unspecified, not intractable, without status migrainosus: Secondary | ICD-10-CM | POA: Diagnosis not present

## 2019-04-04 DIAGNOSIS — D508 Other iron deficiency anemias: Secondary | ICD-10-CM

## 2019-04-04 DIAGNOSIS — G4489 Other headache syndrome: Secondary | ICD-10-CM

## 2019-04-04 DIAGNOSIS — Z9884 Bariatric surgery status: Secondary | ICD-10-CM

## 2019-04-04 MED ORDER — PROPRANOLOL HCL 20 MG PO TABS: 20.0000 mg | ORAL_TABLET | Freq: Three times a day (TID) | ORAL | 1 refills | Status: DC

## 2019-04-04 MED FILL — PROPRANOLOL 20 MG TABLET: 20 | 30 days supply | Qty: 90 | Fill #0

## 2019-04-07 DIAGNOSIS — H40023 Open angle with borderline findings, high risk, bilateral: Secondary | ICD-10-CM | POA: Diagnosis not present

## 2019-04-07 DIAGNOSIS — R51 Headache: Secondary | ICD-10-CM | POA: Diagnosis not present

## 2019-04-11 ENCOUNTER — Encounter: Payer: Self-pay | Admitting: Family Medicine

## 2019-04-14 ENCOUNTER — Other Ambulatory Visit: Payer: Self-pay | Admitting: Obstetrics & Gynecology

## 2019-04-14 NOTE — Telephone Encounter (Signed)
Medication refill request: Wellbutrin XL 300mg  #90,1R, Eszopiclone 3mg  #90, 1R, Pristiq 100mg  #90, 1R Last AEX: 09-12-18 Next AEX: 09-18-19 Last MMG (if hormonal medication request): 02-15-18 [id number] Refill authorized: Please refill if appropriate

## 2019-04-17 MED FILL — DESVENLAFAXINE SUC ER 100 M: 100 | 90 days supply | Qty: 90 | Fill #0

## 2019-04-17 MED FILL — ESZOPICLONE 3 MG TABS: 3 | 90 days supply | Qty: 90 | Fill #0

## 2019-04-17 MED FILL — buPROPion HCL ER (XL) 300 M: 300 | 90 days supply | Qty: 90 | Fill #0

## 2019-04-18 DIAGNOSIS — Z822 Family history of deafness and hearing loss: Secondary | ICD-10-CM | POA: Diagnosis not present

## 2019-04-18 DIAGNOSIS — G43901 Migraine, unspecified, not intractable, with status migrainosus: Secondary | ICD-10-CM | POA: Diagnosis not present

## 2019-04-18 DIAGNOSIS — H903 Sensorineural hearing loss, bilateral: Secondary | ICD-10-CM | POA: Diagnosis not present

## 2019-04-18 DIAGNOSIS — H9313 Tinnitus, bilateral: Secondary | ICD-10-CM | POA: Diagnosis not present

## 2019-04-25 MED FILL — tiZANidine HCL 2 MG TABS: 2 | 30 days supply | Qty: 30 | Fill #1

## 2019-05-16 MED FILL — PROPRANOLOL 20 MG TABLET: 20 | 30 days supply | Qty: 90 | Fill #1

## 2019-05-18 ENCOUNTER — Encounter: Payer: Self-pay | Admitting: Family Medicine

## 2019-05-19 NOTE — Telephone Encounter (Signed)
Called and talked to pt app made.

## 2019-05-22 ENCOUNTER — Other Ambulatory Visit: Payer: Self-pay

## 2019-05-22 MED ORDER — TOPIRAMATE ER 50 MG PO CAP24: ORAL_CAPSULE | ORAL | 0 refills | Status: DC

## 2019-05-22 MED FILL — TROKENDI XR 50 MG CAPSULE: 50 | 28 days supply | Qty: 42 | Fill #0

## 2019-05-26 ENCOUNTER — Other Ambulatory Visit: Payer: Self-pay

## 2019-05-26 ENCOUNTER — Encounter: Payer: Self-pay | Admitting: Family Medicine

## 2019-05-26 ENCOUNTER — Ambulatory Visit (INDEPENDENT_AMBULATORY_CARE_PROVIDER_SITE_OTHER): Payer: 59 | Admitting: Family Medicine

## 2019-05-26 VITALS — BP 120/82 | HR 53 | Temp 97.6°F | Ht 67.0 in | Wt 240.0 lb

## 2019-05-26 DIAGNOSIS — D508 Other iron deficiency anemias: Secondary | ICD-10-CM | POA: Diagnosis not present

## 2019-05-26 DIAGNOSIS — Z13220 Encounter for screening for lipoid disorders: Secondary | ICD-10-CM

## 2019-05-26 DIAGNOSIS — Z0000 Encounter for general adult medical examination without abnormal findings: Secondary | ICD-10-CM | POA: Diagnosis not present

## 2019-05-26 DIAGNOSIS — Z9884 Bariatric surgery status: Secondary | ICD-10-CM

## 2019-05-26 DIAGNOSIS — G4489 Other headache syndrome: Secondary | ICD-10-CM | POA: Diagnosis not present

## 2019-05-26 DIAGNOSIS — G43909 Migraine, unspecified, not intractable, without status migrainosus: Secondary | ICD-10-CM

## 2019-05-26 NOTE — Addendum Note (Signed)
Addended by: Francis Dowse T on: 05/26/2019 03:23 PM   Modules accepted: Orders

## 2019-05-26 NOTE — Progress Notes (Signed)
Subjective:    Judith Holland is a 53 y.o. female and is here for a comprehensive physical exam.  Migraines.  Generally improved with propranolol.  Prescription is for 20 mg 3 times daily but she generally gets it twice daily.  She has had a few episodes of bradycardia but nothing symptomatic.  She sent a message last week wanting to wean the topiramate.  Unfortunately, she was not able to get the medication until after she has been out of her current dose for 1 to 2 days.  She did develop a severe migraine.  It is now improving.  She is currently taking the 100 mg dose of topiramate and still plans to wean off if possible.  We discussed Emgality at a previous visit.  Even on the 150 mg of topiramate, she qualified for [employer].  She would like to go ahead and start that medication today.  Patient has a history of bariatric surgery.  She was found to have several nutritional deficiencies and marginal deficiencies.  She has been taking very wise bariatric multivitamins.  She is also taking vitamin D daily.  And has had 2 iron infusions.  Health Maintenance Due  Topic Date Due  . HIV Screening  07/25/1981  . INFLUENZA VACCINE  05/20/2019   Current Outpatient Medications:  .  ALPRAZolam (XANAX) 0.5 MG tablet, TAKE 1/2 TO 1 TABLET BY MOUTH AS NEEDED FOR SLEEP OR ANXIETY, Disp: 45 tablet, Rfl: 1 .  buPROPion (WELLBUTRIN XL) 300 MG 24 hr tablet, TAKE 1 TABLET BY MOUTH ONCE DAILY, Disp: 90 tablet, Rfl: 4 .  Cyanocobalamin (B-12) 2500 MCG SUBL, Place 2,500 mcg under the tongue daily., Disp: 30 tablet, Rfl: 11 .  desvenlafaxine (PRISTIQ) 100 MG 24 hr tablet, TAKE 1 TABLET BY MOUTH ONCE DAILY, Disp: 90 tablet, Rfl: 4 .  ergocalciferol (VITAMIN D2) 1.25 MG (50000 UT) capsule, Take 1 capsule (50,000 Units total) by mouth 2 (two) times a week., Disp: 16 capsule, Rfl: 6 .  estradiol (VIVELLE-DOT) 0.1 MG/24HR patch, Place 1 patch (0.1 mg total) onto the skin 2 (two) times a week., Disp: 24 patch, Rfl: 4 .   Eszopiclone 3 MG TABS, TAKE 1 TABLET BY MOUTH IMMEDIATELY BEFORE BEDTIME, Disp: 90 tablet, Rfl: 1 .  fluticasone (FLONASE) 50 MCG/ACT nasal spray, PLACE 2 SPRAYS INTO THE NOSE DAILY., Disp: 16 g, Rfl: 3 .  Multiple Vitamins-Minerals (MULTIVITAMIN PO), Take 1 tablet by mouth 2 (two) times a week. , Disp: , Rfl:  .  promethazine (PHENERGAN) 12.5 MG tablet, Take 1-2 tabs PO q4-6 hours prn for nausea and/or vomiting., Disp: 20 tablet, Rfl: 1 .  propranolol (INDERAL) 20 MG tablet, Take 1 tablet (20 mg total) by mouth 3 (three) times daily., Disp: 90 tablet, Rfl: 1 .  rizatriptan (MAXALT) 10 MG tablet, Take 1 tablet earliest onset of migraine.  May repeat once in 2 hours if needed, Disp: 30 tablet, Rfl: 4 .  spironolactone (ALDACTONE) 100 MG tablet, Take 1 tablet (100 mg total) by mouth 2 (two) times daily., Disp: 180 tablet, Rfl: 4 .  tiZANidine (ZANAFLEX) 2 MG tablet, Take 1 tablet (2 mg total) by mouth at bedtime., Disp: 30 tablet, Rfl: 3 .  Topiramate ER (TROKENDI XR) 50 MG CP24, 100mg  daily for 2 weeks followed by 50mg  daily for 2 weeks then stop., Disp: 42 capsule, Rfl: 0   PMHx, SurgHx, SocialHx, Medications, and Allergies were reviewed in the Visit Navigator and updated as appropriate.   Past Medical History:  Diagnosis Date  . Allergy   . Anemia    iron infusion  . Endometrioid adenocarcinoma    Stage IA grade 1  . Eye pressure    elevated  . Infertility, female    clomid and IUI x 3  . Menorrhagia   . Migraines    migraine headaches  . Morbid obesity ([place])   . Rapid heartbeat   . Varicose veins   . Vertigo      Past Surgical History:  Procedure Laterality Date  . CESAREAN SECTION     x2  . DIAGNOSTIC LAPAROSCOPY     for infertility that was negative  . HERNIA REPAIR  3/12  . LAPAROSCOPIC CHOLECYSTECTOMY  1994  . LAPAROSCOPIC RADICAL TOTAL HYSTERECTOMY W/ NODE BIOPSY  12/10   with BSO  . NASAL SINUS SURGERY    . 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 TONSILLECTOMY     x 3       Family History  Problem Relation Age of Onset  . Coronary artery disease Mother   . Heart disease Mother   . Colon cancer Father   . Coronary artery disease Father   . Diabetes Father   . Cancer Father        lung  . Heart disease Father   . Prostate cancer Paternal Grandfather   . Cancer Paternal Grandfather        prostate  . Cancer Maternal Uncle        throat  . Lung cancer Paternal Grandmother   . Cancer Maternal Grandmother        lung    Social History   Tobacco Use  . Smoking status: Never Smoker  . Smokeless tobacco: Never Used  Substance Use Topics  . Alcohol use: No    Alcohol/week: 0.0 standard drinks  . Drug use: No    Review of Systems:   Pertinent items are noted in the HPI. Otherwise, ROS is negative.  Objective:   BP 120/82 (BP Location: Left Arm, Patient Position: Sitting, Cuff Size: Normal)   Pulse (!) 53   Temp 97.6 F (36.4 C) (Temporal)   Ht 5\' 7"  (1.702 m)   Wt 240 lb (108.9 kg)   LMP 09/18/2009 (Approximate)   SpO2 99%   BMI 37.59 kg/m   General appearance: alert, cooperative and appears stated age. Head: normocephalic, without obvious abnormality, atraumatic. Neck: no adenopathy, supple, symmetrical, trachea midline; thyroid not enlarged, symmetric, no tenderness/mass/nodules. Lungs: clear to auscultation bilaterally. Heart: regular rate and rhythm Abdomen: soft, non-tender; no masses,  no organomegaly. Extremities: extremities normal, atraumatic, no cyanosis or edema. Skin: skin color, texture, turgor normal, no rashes or lesions. Lymph: cervical, supraclavicular, and axillary nodes normal; no abnormal inguinal nodes palpated. Neurologic: grossly normal.                                      Assessment/Plan:   Judith Holland was seen today for annual exam.  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  Will get able  to mentality today and prescribed.  Patient's goal is to control migraines to decrease the future white matter changes but to see if cessation of topiramate helps with her word finding difficulties.  She will be set up with a neuropsychologist through neurology.  Screening for lipid disordersA -     Lipid panel  Patient Counseling: [x]    Nutrition: Stressed importance of moderation in sodium/caffeine intake, saturated fat and cholesterol, caloric balance, sufficient intake of fresh fruits, vegetables, fiber, calcium, iron, and 1 mg of folate supplement per day (for females capable of pregnancy).  [x]    Stressed the importance of regular exercise.   [x]    Substance Abuse: Discussed cessation/primary prevention of tobacco, alcohol, or other drug use; driving or other dangerous activities under the influence; availability of treatment for abuse.   [x]    Injury prevention: Discussed safety belts, safety helmets, smoke detector, smoking near bedding or upholstery.   [x]    Sexuality: Discussed sexually transmitted diseases, partner selection, use of condoms, avoidance of unintended pregnancy  and contraceptive alternatives.  [x]    Dental health: Discussed importance of regular tooth brushing, flossing, and dental visits.  [x]    Health maintenance and immunizations reviewed. Please refer to Health maintenance section.   Briscoe Deutscher, DO [place]

## 2019-05-27 LAB — COMPREHENSIVE METABOLIC PANEL
AG Ratio: 2 (calc) (ref 1.0–2.5)
ALT: 27 U/L (ref 6–29)
AST: 26 U/L (ref 10–35)
Albumin: 4.2 g/dL (ref 3.6–5.1)
Alkaline phosphatase (APISO): 70 U/L (ref 37–153)
BUN: 13 mg/dL (ref 7–25)
CO2: 27 mmol/L (ref 20–32)
Calcium: 9.2 mg/dL (ref 8.6–10.4)
Chloride: 108 mmol/L (ref 98–110)
Creat: 0.89 mg/dL (ref 0.50–1.05)
Globulin: 2.1 g/dL (calc) (ref 1.9–3.7)
Glucose, Bld: 87 mg/dL (ref 65–99)
Potassium: 3.9 mmol/L (ref 3.5–5.3)
Sodium: 137 mmol/L (ref 135–146)
Total Bilirubin: 0.7 mg/dL (ref 0.2–1.2)
Total Protein: 6.3 g/dL (ref 6.1–8.1)

## 2019-05-27 LAB — IRON,TIBC AND FERRITIN PANEL
%SAT: 49 % (calc) — ABNORMAL HIGH (ref 16–45)
Ferritin: 98 ng/mL (ref 16–232)
Iron: 180 ug/dL — ABNORMAL HIGH (ref 45–160)
TIBC: 365 mcg/dL (calc) (ref 250–450)

## 2019-05-27 LAB — CBC WITH DIFFERENTIAL/PLATELET
Absolute Monocytes: 474 cells/uL (ref 200–950)
Basophils Absolute: 18 cells/uL (ref 0–200)
Basophils Relative: 0.4 %
Eosinophils Absolute: 41 cells/uL (ref 15–500)
Eosinophils Relative: 0.9 %
HCT: 40.4 % (ref 35.0–45.0)
Hemoglobin: 14.5 g/dL (ref 11.7–15.5)
Lymphs Abs: 1219 cells/uL (ref 850–3900)
MCH: 33.8 pg — ABNORMAL HIGH (ref 27.0–33.0)
MCHC: 35.9 g/dL (ref 32.0–36.0)
MCV: 94.2 fL (ref 80.0–100.0)
MPV: 12 fL (ref 7.5–12.5)
Monocytes Relative: 10.3 %
Neutro Abs: 2847 cells/uL (ref 1500–7800)
Neutrophils Relative %: 61.9 %
Platelets: 200 10*3/uL (ref 140–400)
RBC: 4.29 10*6/uL (ref 3.80–5.10)
RDW: 12.8 % (ref 11.0–15.0)
Total Lymphocyte: 26.5 %
WBC: 4.6 10*3/uL (ref 3.8–10.8)

## 2019-05-27 LAB — LIPID PANEL
Cholesterol: 179 mg/dL (ref <200)
HDL: 59 mg/dL (ref >=50)
LDL Cholesterol (Calc): 103 mg/dL (calc) — ABNORMAL HIGH
Non-HDL Cholesterol (Calc): 120 mg/dL (calc) (ref <130)
Total CHOL/HDL Ratio: 3 (calc) (ref <5.0)
Triglycerides: 83 mg/dL (ref <150)

## 2019-05-28 ENCOUNTER — Encounter: Payer: Self-pay | Admitting: Family Medicine

## 2019-06-04 DIAGNOSIS — H2513 Age-related nuclear cataract, bilateral: Secondary | ICD-10-CM | POA: Insufficient documentation

## 2019-06-04 DIAGNOSIS — H40023 Open angle with borderline findings, high risk, bilateral: Secondary | ICD-10-CM | POA: Insufficient documentation

## 2019-06-08 MED FILL — ALPRAZolam 0.5 MG TABS: 0.5 | 45 days supply | Qty: 45 | Fill #1

## 2019-06-09 MED FILL — tiZANidine HCL 2 MG TABS: 2 | 30 days supply | Qty: 30 | Fill #2

## 2019-06-28 ENCOUNTER — Encounter: Payer: Self-pay | Admitting: Family Medicine

## 2019-06-29 ENCOUNTER — Other Ambulatory Visit: Payer: Self-pay | Admitting: Family Medicine

## 2019-06-29 ENCOUNTER — Other Ambulatory Visit: Payer: Self-pay

## 2019-06-29 DIAGNOSIS — G4489 Other headache syndrome: Secondary | ICD-10-CM

## 2019-06-29 DIAGNOSIS — G43909 Migraine, unspecified, not intractable, without status migrainosus: Secondary | ICD-10-CM

## 2019-06-29 MED ORDER — PROPRANOLOL HCL 20 MG PO TABS: 20.0000 mg | ORAL_TABLET | Freq: Three times a day (TID) | ORAL | 1 refills | Status: DC

## 2019-06-29 MED ORDER — GALCANEZUMAB-GNLM 120 MG/ML ~~LOC~~ SOAJ: 120.0000 mg | Freq: Once | SUBCUTANEOUS | Status: DC

## 2019-06-29 MED ORDER — FREESTYLE LANCETS MISC: 12 refills | Status: DC

## 2019-06-29 MED ORDER — FREESTYLE INSULINX TEST VI STRP: ORAL_STRIP | 12 refills | Status: DC

## 2019-06-29 MED FILL — FREESTYLE LITE TEST STRIP: 25 days supply | Qty: 100 | Fill #0

## 2019-06-29 MED FILL — PROPRANOLOL 20 MG TABLET: 20 | 30 days supply | Qty: 90 | Fill #0

## 2019-06-29 MED FILL — FREESTYLE LANCETS: 25 days supply | Qty: 100 | Fill #0

## 2019-06-29 NOTE — Progress Notes (Signed)
New rx for [facility] sent to [facility] per patient request.

## 2019-07-05 ENCOUNTER — Other Ambulatory Visit: Payer: Self-pay

## 2019-07-05 DIAGNOSIS — I871 Compression of vein: Secondary | ICD-10-CM

## 2019-07-06 MED FILL — tiZANidine HCL 2 MG TABS: 2 | 30 days supply | Qty: 30 | Fill #3

## 2019-07-13 ENCOUNTER — Ambulatory Visit (INDEPENDENT_AMBULATORY_CARE_PROVIDER_SITE_OTHER): Payer: 59 | Admitting: Licensed Clinical Social Worker

## 2019-07-13 DIAGNOSIS — F331 Major depressive disorder, recurrent, moderate: Secondary | ICD-10-CM

## 2019-07-13 MED FILL — ESZOPICLONE 3 MG TABS: 3 | 90 days supply | Qty: 90 | Fill #1

## 2019-07-18 ENCOUNTER — Other Ambulatory Visit: Payer: Self-pay

## 2019-07-18 ENCOUNTER — Encounter: Payer: Self-pay | Admitting: Psychology

## 2019-07-18 ENCOUNTER — Ambulatory Visit (INDEPENDENT_AMBULATORY_CARE_PROVIDER_SITE_OTHER): Payer: 59 | Admitting: Psychology

## 2019-07-18 ENCOUNTER — Ambulatory Visit: Payer: 59 | Admitting: Psychology

## 2019-07-18 DIAGNOSIS — F411 Generalized anxiety disorder: Secondary | ICD-10-CM

## 2019-07-18 DIAGNOSIS — F330 Major depressive disorder, recurrent, mild: Secondary | ICD-10-CM | POA: Diagnosis not present

## 2019-07-18 DIAGNOSIS — R413 Other amnesia: Secondary | ICD-10-CM

## 2019-07-18 DIAGNOSIS — R4189 Other symptoms and signs involving cognitive functions and awareness: Secondary | ICD-10-CM | POA: Diagnosis not present

## 2019-07-18 NOTE — Progress Notes (Signed)
NEUROPSYCHOLOGICAL EVALUATION [place]. [facility] Department of Neurology  Reason for Referral:   Judith Holland is a 53 y.o. Caucasian female referred by Metta Clines, D.O., to characterize her current cognitive functioning and assist with diagnostic clarity and treatment planning in the context of a history of migraine headaches, abnormal brain MRI, and subjective cognitive decline.  Assessment and Plan:   Clinical Impression(s): Overall, [name] pattern of performance is suggestive of neuropsychological functioning within normal limits. Relative weaknesses were exhibited across a task assessing spatial manipulation, as well as a task assessing concept formation and problem solving. However, these scores remained within appropriate normative ranges given premorbid intellectual estimations. Performance across domains of processing speed, attention/concentration, other aspects of executive functioning, receptive and expressive language, other aspects of visuospatial functioning, and learning and memory were within normal limits.   Factors which can create and maintain cognitive inefficiencies include significant psychiatric distress (i.e., moderate levels of acute anxiety), abnormal neuroimaging, frequent headaches, and medication side effects. It is likely that a combination of these factors are largely responsible for day-to-day cognitive difficulties which [name] has been experiencing presently, with perhaps the primary culprit being ongoing symptoms of anxiety.  Recommendations: A combination of medication and psychotherapy has been shown to be most effective at treating symptoms of anxiety and depression. As such, [name] is encouraged to speak with her prescribing physician regarding medication adjustments to optimally manage these symptoms. Likewise, Ms. Bump is encouraged to continue with short-term psychotherapy to address symptoms of psychiatric  distress. She would benefit from an active and collaborative therapeutic environment, rather than one purely supportive in nature. Recommended treatment modalities include Cognitive Behavioral Therapy (CBT) or Acceptance and Commitment Therapy (ACT).  When learning new information, she would benefit from information being broken up into small, manageable pieces. She may also find it helpful to articulate the material in her own words and in a context to promote encoding at the onset of a new task. This material may need to be repeated multiple times to promote encoding.  To address problems with fluctuating attention, she may wish to consider:   -Avoiding external distractions when needing to concentrate   -Limiting exposure to fast paced environments with multiple sensory demands   -Writing down complicated information and using checklists   -Attempting and completing one task at a time (i.e., no multi-tasking)   -Reducing the amount of information considered at one time  Review of Records:   [name] was seen by [facility] Neurology Metta Clines, D.O.) on 03/30/2019 for follow-up of migraine headaches and an abnormal brain MRI. Regarding the former, headaches were said to occur off and on since early adulthood, but became more frequent in 2016. About that time, [name] also started reporting trouble with memory deficits and poor coordination/vertigo. Dr. Tomi Likens recommended switching from topiramate IR to ER to potentially reduce cognitive side effects (memory deficits were said to precede starting topiramate). Brain MRI on 03/10/2019 revealed multiple punctate T2 hyperintense foci throughout the juxtacortical, deep, and periventricular white matter bilaterally, but no involvement of the corpus callosum or brainstem. 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 Left greater than right dilated optic nerve sheaths were noted, as well as partial empty sella,  raising suspicion of idiopathic intracranial hypertension. Lumbar puncture on 03/21/2019 demonstrated a normal opening pressure of 8 cm H2O and closing pressure of 12 cm H2O. CSF analysis showed cell count 1, protein 35,  glucose 56, negative culture, and no oligoclonal bands. Lumbar puncture results, coupled with stable white matter changes were said to not support a diagnosis of multiple sclerosis. Ultimately, [name] was referred for a comprehensive neuropsychological evaluation to characterize her cognitive abilities and to assist with diagnostic clarity and treatment planning.  [name] was most recently seen by [facility] Briscoe Deutscher, D.O.) on 05/26/2019 for a comprehensive physical exam. Regarding headaches, [name] expressed a desire to wean herself off topiramate and started [facility] for headache management during this visit.   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 She has maintained over 100 pound weight loss.   Varicose veins    Vertigo     Past Surgical History:  Procedure Laterality Date   CESAREAN SECTION     x2   DIAGNOSTIC LAPAROSCOPY     for infertility that was negative   HERNIA REPAIR  3/12   LAPAROSCOPIC CHOLECYSTECTOMY  1994   LAPAROSCOPIC RADICAL TOTAL HYSTERECTOMY W/ NODE BIOPSY  12/10   with BSO   NASAL SINUS SURGERY     ROUX-EN-Y GASTRIC BYPASS  9/11   TONSILLECTOMY     x 3    Family History  Problem Relation Age of Onset   Coronary artery disease Mother    Heart disease Mother    Colon cancer Father    Coronary artery disease Father     Diabetes Father    Cancer Father        lung   Heart disease Father    Prostate cancer Paternal Grandfather    Cancer Paternal Grandfather        prostate   Cancer Maternal Uncle        throat   Lung cancer Paternal Grandmother    Cancer Maternal Grandmother        lung     Current Outpatient Medications:    ALPRAZolam (XANAX) 0.5 MG tablet, TAKE 1/2 TO 1 TABLET BY MOUTH AS NEEDED FOR SLEEP OR ANXIETY, Disp: 45 tablet, Rfl: 1   blood glucose meter kit and supplies KIT, Dispense based on patient and insurance preference. Use up to four times daily as directed. (FOR ICD-9 250.00, 250.01)., Disp: 1 each, Rfl: 0   buPROPion (WELLBUTRIN XL) 300 MG 24 hr tablet, TAKE 1 TABLET BY MOUTH ONCE DAILY, Disp: 90 tablet, Rfl: 4   Cyanocobalamin (B-12) 2500 MCG SUBL, Place 2,500 mcg under the tongue daily., Disp: 30 tablet, Rfl: 11   desvenlafaxine (PRISTIQ) 100 MG 24 hr tablet, TAKE 1 TABLET BY MOUTH ONCE DAILY, Disp: 90 tablet, Rfl: 4   ergocalciferol (VITAMIN D2) 1.25 MG (50000 UT) capsule, Take 1 capsule (50,000 Units total) by mouth 2 (two) times a week., Disp: 16 capsule, Rfl: 6   estradiol (VIVELLE-DOT) 0.1 MG/24HR patch, Place 1 patch (0.1 mg total) onto the skin 2 (two) times a week., Disp: 24 patch, Rfl: 4   Eszopiclone 3 MG TABS, TAKE 1 TABLET BY MOUTH IMMEDIATELY BEFORE BEDTIME, Disp: 90 tablet, Rfl: 1   glucose  blood (FREESTYLE INSULINX TEST) test strip, Use as instructed, Disp: 100 each, Rfl: 12   Lancets (FREESTYLE) lancets, Use as instructed, Disp: 100 each, Rfl: 12   Multiple Vitamins-Minerals (MULTIVITAMIN PO), Take 1 tablet by mouth 2 (two) times a week. , Disp: , Rfl:    propranolol (INDERAL) 20 MG tablet, Take 1 tablet (20 mg total) by mouth 3 (three) times daily., Disp: 90 tablet, Rfl: 1   rizatriptan (MAXALT) 10 MG tablet, Take 1 tablet earliest onset of migraine.  May repeat once in 2 hours if needed, Disp: 30 tablet, Rfl: 4   spironolactone (ALDACTONE) 100  MG tablet, Take 1 tablet (100 mg total) by mouth 2 (two) times daily., Disp: 180 tablet, Rfl: 4   tiZANidine (ZANAFLEX) 2 MG tablet, Take 1 tablet (2 mg total) by mouth at bedtime., Disp: 30 tablet, Rfl: 3   Topiramate ER (TROKENDI XR) 50 MG CP24, 190m daily for 2 weeks followed by 554mdaily for 2 weeks then stop., Disp: 42 capsule, Rfl: 0  Current Facility-Administered Medications:    Galcanezumab-gnlm SOAJ 120 mg, 120 mg, Subcutaneous, Once, WaBriscoe DeutscherDO  Clinical Interview:   Cognitive Symptoms: Decreased short-term memory: Endorsed. Primary difficulties surrounded trouble remembering the names of familiar individuals. Ms. YePinkettlso reported being unsure if memory deficits were actually more related to symptoms of fatigue or her busy and "chaotic" work environment.  Decreased long-term memory: Denied. Decreased attention/concentration: Largely denied. When frequently interrupted at work, she did acknowledge some trouble maintaining her focus. However, she noted that during times where she is left alone to work, she denied the presence of attentional concerns.  Increased ease of distractibility: Denied. Reduced processing speed: Denied. Difficulties with executive functions: Endorsed. Specifically, she described it being more challenging for her to multi-task, where she is now more reliant on writing things down in order to promote memory, which represents a significant change. Trouble with indecisiveness was described as longstanding in nature. Difficulties with impulsivity or using good judgment were denied.  Difficulties with emotion regulation: Denied. Difficulties with receptive language: Denied. Difficulties with word finding: Endorsed. These were described as tip-of-the-tongue phenomenons, with words generally coming to her later when she dwells upon them. However, she noted being able to make appropriate word substitutions and keep conversations flowing reasonably well.     Decreased visuoperceptual ability: Endorsed. She reported ongoing trouble with depth perception, which she reported as the cause of balance instability, overall clumsiness, and a history of several falls (none reported in the last 3 months).   Trajectory of deficits: Cognitive deficits were noted to be present for the past 3 years and have exhibited a relatively stable presentation. She was unable to recognize any pattern to her cognitive deficits and denied periods of fluctuating cognitive deficits or distinct symptom flare-ups. However, she did acknowledge that headache symptoms and fatigue can worsen cognitive abilities.   Difficulties completing ADLs: Denied.  Additional Medical History: History of traumatic brain injury/concussion: Denied. History of stroke: Denied. History of seizure activity: Denied. History of known exposure to toxins: Denied. Symptoms of chronic pain: Denied. Additional instances of swelling, joint pain, or arthritic pain were also denied. Experience of frequent headaches/migraines: Endorsed. Since starting Emgality, Ms. YeMoroescribed decreases in both headache frequency (currently occurring 2 times per month) and headache intensity (to where she no longer classifies her headaches as migraines).  Frequent instances of dizziness/vertigo: Denied.  Sensory changes: Endorsed. She reported wearing contact lenses with generally positive effect; however, she did note still  requiring readers at times. She also reported a history of diagnosed hearing loss, but was unsure if she required hearing aids. Other sensory changes/difficulties (i.e., taste and smell) were denied.  Balance/coordination difficulties: Endorsed. These were previously described above and generally attributed to depth perception concerns and longstanding clumsiness.  Other motor difficulties: Denied.  Other medical conditions: A history of skin rashes or unexplained fevers were denied.   Sleep  History: Estimated hours obtained each night: 6-7 hours. Difficulties falling asleep: Denied with the assistance of sleep aid medications. Difficulties staying asleep: Variably. Difficulties have been improved with medications. However, in instances in which Ms. Dumas wakes to use the restroom, she commonly experiences difficulties falling back asleep due to having an overactive mind and racing thoughts.  Feels rested and refreshed upon awakening: Endorsed.  History of snoring: Denied. History of waking up gasping for air: Denied. Witnessed breath cessation while asleep: Denied.  History of vivid dreaming: Endorsed. Recently, Ms. Denard described an increase in vivid dreams, including some which are frightful or distressing. She also noted that her husband has had to wake her while asleep due to her crying out in the past, including 3 times over the past year. Excessive movement while asleep: Denied. Instances of acting out her dreams: Denied outside of what is described above.  Psychiatric/Behavioral Health History: Depression: Endorsed. Ms. Skufca described longstanding difficulties with depression. Symptoms were exacerbated following the passing of her mother approximately 3 years ago. Currently, she reported utilizing mood-related medications with positive effect. She also reported starting individual psychotherapy during the previous week. Current or remote suicidal ideation, intent, or plan was denied.  Anxiety: Endorsed. Like depression, symptoms of anxiety were described as longstanding in nature and generally managed well via oral medications. Anxiety and depression were said to be co-occurring symptoms rather than one being primarily responsible for the other.  Mania: Denied. Trauma History: Denied. Visual/auditory hallucinations: Denied. Delusional thoughts: Denied.  Tobacco: Denied. Alcohol: Ms. Spanbauer reported very rare alcohol consumption. She denied a history of problematic  alcohol use, abuse, or dependence.  Recreational drugs: Denied. Caffeine: Endorsed. She reported consuming several glasses of tea throughout the day.  Academic/Vocational History: Highest level of educational attainment: 15 years. 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   History of developmental delay: Denied. History of grade repetition: Denied. History of class failures: Denied. Enrollment in special education courses: Denied. Longstanding strengths/weaknesses: Mathematics was noted as a relative weakness. History of diagnosed specific learning disability: Denied. History of ADHD: Denied.  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   Evaluation Results:   Behavioral Observations: Ms. Kalka was unaccompanied, arrived to her appointment on time, and was appropriately dressed and groomed. Observed gait and station were within normal limits. Gross motor functioning appeared intact upon informal observation and no abnormal movements (e.g., tremors) were noted. Her affect was generally relaxed and positive, but did range appropriately given the subject being discussed during the clinical interview, including becoming tearful when discussing her mother's passing. During testing, she briefly became tearful while completing a list learning task, as well as while filling out self-report questionnaires. Spontaneous speech was fluent. Subtle word finding difficulties were observed during the clinical interview, but only when discussing more emotionally charged content. Sustained attention was appropriate throughout. Thought processes  were coherent, organized, and normal in content. Task engagement was adequate and she persisted well when challenged. Overall, Ms. Bertling was  cooperative with the clinical interview and subsequent testing procedures.   Adequacy of Effort: The validity of neuropsychological testing is limited by the extent to which the individual being tested may be assumed to have exerted adequate effort during testing. Ms. Mawhinney expressed her intention to perform to the best of her abilities and exhibited adequate task engagement and persistence. Scores across stand-alone and embedded performance validity measures were within expectation. As such, the results of the current evaluation are believed to be a valid representation of Ms. Dech's current cognitive functioning.  Test Results: Ms. Garraway was fully oriented at the time of the current evaluation.  Intellectual abilities based upon educational and vocational attainment were estimated to be in the average range. Premorbid abilities were estimated to be within the average range based upon a single-word reading test.   Processing speed was within normal limits. Basic attention was average. More complex attention (e.g., working memory) was average. Performance across tasks assessing executive functions (e.g., cognitive flexibility, response inhibition, nonverbal abstract reasoning, analytical problem solving) were within normal limits. A relative weakness was exhibited across an unstructured task assessing concept formation and problem solving.  Assessed receptive language abilities were within normal limits. Likewise, Ms. Arrants did not exhibit any difficulties comprehending task instructions and answered all questions asked of her appropriately. Assessed expressive language (e.g., verbal fluency and confrontation naming) was within normal limits.     Assessed visuospatial/visuoconstructional abilities were within normal limits.    Learning (i.e., encoding) of novel verbal and visual information was within normal limits. Spontaneous delayed recall (i.e., retrieval) of previously learned  information was commensurate with performance across initial learning trials. Retention rates were appropriate across memory measures. Performance across recognition tasks was also appropriate, suggesting evidence for appropriate information consolidation.   Results of emotional screening instruments suggested that recent symptoms of generalized anxiety were in the moderate range, while symptoms of depression were within normal limits. A screening instrument assessing recent sleep quality suggested the presence of minimal sleep dysfunction.  Tables of Scores:   Note: This summary of test scores accompanies the interpretive report and should not be considered in isolation without reference to the appropriate sections in the text. Descriptors are based on appropriate normative data and may be adjusted based on clinical judgment. The terms impaired and within normal limits (WNL) are used when a more specific level of functioning cannot be determined.       Effort Testing:   DESCRIPTOR       ACS Word Choice: --- --- Within Expectation  CVLT-III Forced Choice Recognition: --- --- Within Expectation  BVMT-R Retention Percentage: --- --- Within Expectation       Orientation:      Raw Score Percentile   NAB Orientation, Form 1 29/29 72 Average       Intellectual Functioning:           Standard Score Percentile   Test of Premorbid Functioning (TOPF): 100 50 Average       Memory:          Wechsler Memory Scale (WMS-IV):                       Raw Score (Scaled Score) Percentile     Logical Memory I 36/50 (14) 91 Above Average    Logical Memory II 34/50 (15) 95 Well Above Average  Logical Memory Recognition 27/30 >75 Above Average       [place] Verbal Learning Test (CVLT-III), Standard Form: Raw Score (Scaled/Standard Score) Percentile   Total Trials 1-5 49/80 (100) 50 Average    List B 6/16 (11) 63 Average    Short-Delay Free Recall 10/16 (10) 50 Average    Short-Delay Cued Recall  11/16 (9) 37 Average    Long Delay Free Recall 12/16 (11) 63 Average    Long Delay Cued Recall 12/16 (10) 50 Average      Recognition Hits 14/16 (7) 16 Below Average      False Positive Errors 1 (11) 63 Average       Brief Visuospatial Memory Test (BVMT-R), Form 1: Raw Score  (T Score) Percentile     Total Trials 1-3 26/36 (54) 66 Average    Delayed Recall 10/12 (55) 69 Average    Recognition Discrimination Index 6 >16 Within Normal Limits      Recognition Hits 6/6 >16 Within Normal Limits      False Positive Errors 0 >16 Within Normal Limits        Attention/Executive Function:          Trail Making Test (TMT): Raw Score (T Score) Percentile     Part A 32 secs.,  0 errors (46) 34 Average    Part B 64 secs.,  0 errors (47) 38 Average       Symbol Digit Modalities Test (SDMT): Raw Score (Z-Score) Percentile     Written 47 (-0.62) 27 Average    Oral 54 (-0.49) 31 Average       NAB Attention Module, Form 1: T Score Percentile     Digits Forward 51 54 Average    Digits Backwards 52 58 Average       D-KEFS Color-Word Interference Test: Raw Score (Scaled Score) Percentile     Color Naming 24 secs. (13) 84 Above Average    Word Reading 19 secs. (12) 75 Above Average    Inhibition 59 secs. (10) 50 Average      Total Errors 1 error (11) 63 Average    Inhibition/Switching 74 secs. (10) 50 Average      Total Errors 3 errors (9) 37 Average       D-KEFS Verbal Fluency Test: Raw Score (Scaled Score) Percentile     Letter Total Correct 37 (10) 50 Average    Category Total Correct 35 (9) 37 Average    Category Switching Total Correct 12 (9) 37 Average    Category Switching Accuracy 11 (9) 37 Average      Total Set Loss Errors 0 (13) 84 Above Average      Total Repetition Errors 3 (10) 50 Average       D-KEFS 20 Questions Test: Scaled Score Percentile     Total Weighted Achievement Score 12 75 Above Average    Initial Abstraction Score 10 50 Average       [place] Card Sorting  Test [facility]): Raw Score Percentile     Categories (trials) 1 (64) 6-10 Below Average    Total Errors 26 9 Below Average    Perseverative Errors 14 14 Below Average    Non-Perseverative Errors 12 9 Below Average    Failure to Maintain Set 1 --- ---       Language:          NAB Language Module, Form 1: T Score Percentile     Auditory Comprehension 57 75 Above Average    Naming  55 69 Average       Visuospatial/Visuoconstruction:          NAB Spatial Module, Form 1: T Score Percentile     Figure Drawing Copy 65 93 Well Above Average    Figure Drawing Immediate Recall 63 91 Well Above Average        Scaled Score Percentile   WAIS-IV Matrix Reasoning: 9 37 Average  WAIS-IV Visual Puzzles: 7 16 Below Average       Mood and Personality:      Raw Score Percentile   Beck Depression Inventory - II: 12 --- Within Normal Limits  PROMIS Anxiety Questionnaire: 23 --- Moderate       Additional Questionnaires:      Raw Score Percentile   PROMIS Sleep Disturbance Questionnaire: 24 --- None to Slight   Informed Consent and Coding/Compliance:   Ms. Smitherman was provided with a verbal description of the nature and purpose of the present neuropsychological evaluation. Also reviewed were the foreseeable risks and/or discomforts and benefits of the procedure, limits of confidentiality, and mandatory reporting requirements of this provider. The patient was given the opportunity to ask questions and receive answers about the evaluation. Oral consent to participate was provided by the patient.   This evaluation was conducted by Christia Reading, Ph.D., licensed clinical neuropsychologist. Ms. Hagarty completed a 30-minute clinical interview, billed as one unit [phone], and 140 minutes of cognitive testing, billed as one unit [phone] and four additional units [id number]. Psychometrist Milana Kidney, B.S., assisted Dr. Melvyn Novas with test administration and scoring procedures. As a separate and discrete service, Dr. Melvyn Novas  spent a total of 180 minutes in interpretation and report writing, billed as one unit 96132 and two units 96133.

## 2019-07-18 NOTE — Progress Notes (Signed)
   Neuropsychology Note   Judith Holland completed 125 minutes of neuropsychological testing with technician, Milana Kidney, B.S., under the supervision of Dr. Christia Reading, Ph.D., licensed neuropsychologist. The patient did not appear overtly distressed by the testing session, per behavioral observation or via self-report to the technician. Rest breaks were offered.    In considering the patient's current level of functioning, level of presumed impairment, nature of symptoms, emotional and behavioral responses during the interview, level of literacy, and observed level of motivation/effort, a battery of tests was selected and communicated to the psychometrician.   Communication between the psychologist and technician was ongoing throughout the testing session and changes were made as deemed necessary based on patient performance on testing, technician observations and additional pertinent factors such as those listed above.   Huey Romans will return within approximately two weeks for an interactive feedback session with Dr. Melvyn Novas at which time his test performances, clinical impressions, and treatment recommendations will be reviewed in detail. The patient understands she can contact our office should she require our assistance before this time.   Full report to follow.  125 minutes were spent face-to-face with patient administering standardized tests and 15 minutes were spent scoring (technician). [CPT [id number], [id number]

## 2019-07-19 ENCOUNTER — Telehealth: Payer: Self-pay | Admitting: Family Medicine

## 2019-07-19 ENCOUNTER — Encounter: Payer: Self-pay | Admitting: Psychology

## 2019-07-19 ENCOUNTER — Other Ambulatory Visit: Payer: Self-pay

## 2019-07-19 NOTE — Telephone Encounter (Signed)
Pt stated she now needs a rx for emgality as it would be a new rx. Please advise.  [place], [place] - [place] [phone] (Phone) [phone] (Fax)

## 2019-07-19 NOTE — Progress Notes (Signed)
Pt stated she now needs a rx for emgality as it would be a new rx. Please advise

## 2019-07-19 NOTE — Telephone Encounter (Signed)
Rx teed up and sent to Dr. Juleen China.

## 2019-07-19 NOTE — Telephone Encounter (Signed)
See note

## 2019-07-20 MED ORDER — EMGALITY 120 MG/ML ~~LOC~~ SOAJ: 120.0000 mg | SUBCUTANEOUS | 3 refills | Status: DC

## 2019-07-25 ENCOUNTER — Encounter: Payer: Self-pay | Admitting: Psychology

## 2019-07-25 ENCOUNTER — Other Ambulatory Visit: Payer: Self-pay

## 2019-07-25 ENCOUNTER — Ambulatory Visit (INDEPENDENT_AMBULATORY_CARE_PROVIDER_SITE_OTHER): Payer: 59 | Admitting: Psychology

## 2019-07-25 DIAGNOSIS — R4189 Other symptoms and signs involving cognitive functions and awareness: Secondary | ICD-10-CM

## 2019-07-25 NOTE — Progress Notes (Signed)
   NEUROPSYCHOLOGICAL EVALUATION - Feedback [employer] Department of Neurology  Reason for Referral:   Judith Holland a 53 y.o. Caucasian female referred by Metta Clines, D.O.,to characterize hercurrent cognitive functioning and assist with diagnostic clarity and treatment planning in the context of a history of migraine headaches, abnormal brain MRI, and subjective cognitive decline.  Feedback:   [name] completed a comprehensive neuropsychological evaluation on 07/18/2019. Briefly, results suggested neuropsychological functioning within normal limits. Relative weaknesses were exhibited across a task assessing spatial manipulation, as well as a task assessing concept formation and problem solving. However, these scores remained within appropriate normative ranges given premorbid intellectual estimations.. Recommendations included a combination of medication and psychotherapy to address ongoing mood concerns, especially surrounding anxiety.  [name] was unaccompanied. Content of the current session focused on the results of her evaluation and likely causes of day-to-day cognitive difficulties. [name] was given the opportunity to ask questions and all her questions were answered. She was also encouraged to reach out should additional questions arise. A copy of her report was provided at the conclusion of the visit.      A total of 15 minutes were spent with [name] during the current feedback session.

## 2019-07-26 ENCOUNTER — Other Ambulatory Visit: Payer: Self-pay

## 2019-07-26 MED FILL — SPIRONOLACTONE 100 MG TAB: 100 | 90 days supply | Qty: 180 | Fill #1

## 2019-07-26 MED FILL — buPROPion HCL ER (XL) 300 M: 300 | 90 days supply | Qty: 90 | Fill #1

## 2019-07-26 MED FILL — DOTTI 0.1 MG/24HR PTTW: 0.1 | 84 days supply | Qty: 24 | Fill #2

## 2019-07-26 MED FILL — DESVENLAFAXINE SUC ER 100 M: 100 | 90 days supply | Qty: 90 | Fill #1

## 2019-08-01 ENCOUNTER — Ambulatory Visit (INDEPENDENT_AMBULATORY_CARE_PROVIDER_SITE_OTHER): Payer: 59 | Admitting: Licensed Clinical Social Worker

## 2019-08-01 ENCOUNTER — Ambulatory Visit: Payer: 59 | Admitting: Family Medicine

## 2019-08-01 DIAGNOSIS — F331 Major depressive disorder, recurrent, moderate: Secondary | ICD-10-CM | POA: Diagnosis not present

## 2019-08-01 NOTE — Progress Notes (Deleted)
Virtual Visit via Video Note The purpose of this virtual visit is to provide medical care while limiting exposure to the novel coronavirus.    Consent was obtained for video visit:  Yes.   Answered questions that patient had about telehealth interaction:  Yes.   I discussed the limitations, risks, security and privacy concerns of performing an evaluation and management service by telemedicine. I also discussed with the patient that there may be a patient responsible charge related to this service. The patient expressed understanding and agreed to proceed.  Pt location: Home Physician Location: Home Name of referring provider:  Briscoe Deutscher, DO I connected with Huey Romans at patients initiation/request on 08/03/2019 at  8:50 AM EDT by video enabled telemedicine application and verified that I am speaking with the correct person using two identifiers. Pt MRN:  [id number] Pt DOB:  1966/04/10 Video Participants:  Huey Romans   History of Present Illness:  Judith Holland is a 53 year old woman who follows up for memory deficits and headaches.  UPDATE: To assess memory, she underwent a neuropsychological evaluation on 07/18/2019.  Her pattern of performance was within normal limits.  Relative weakness were exhibited across a task assessing spatial manipulation, concept formation and problem solving.  However, scores remained within appropriate normative range.  Intensity:  *** Duration:  *** Frequency:  *** Current NSAIDS:diclofenac Current analgesics:no Current triptans:rizatriptan 69m Current anti-emetic:promethazine Current muscle relaxants:tizanidine 271mat bedtime (neck pain) Current anti-anxiolytic:alprazolam Current sleep aide:eszopiclone Current Antihypertensive medications:spironolactone Current Antidepressant medications:Bupropion, Pristiq Current Anticonvulsant medications:Qudexy XR 15077murrent Vitamins/Herbal/Supplements:MVI, D,  B12 Current Antihistamines/Decongestants:Flonase Other therapy:no Hormone/birth control:  Estradiol  Caffeine:tea Alcohol:no Smoker:no Diet:hydrates Exercise:no Depression/anxiety: grieving since passing of her mother in September. Sleep hygiene:Okay with Lunesta  HISTORY: Onset:  She has had headaches on and off since young adulthood. They became more frequent in 2016. Around that time, she also began experiencing memory deficits as well as poor coordination and vertigo. 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 MRI of brain without contrast from 08/22/15 revealed nonspecific small foci of T2 hyperintensity in the cerebral white matter.She reportedly had a repeat MRI in November 2017, but a comparison was not made. MS was considered. As for the headaches: Location:Above the right eye Quality:sharp Initial intensity:7/10 Aura:no Prodrome:no Postdrome:no Associated symptoms:  Photophobia. Sometimes nausea. No photophobia or visual disturbance. She has not had any new worse headache of her life, waking up from sleep Initial Duration:1 to 2 days Initial Frequency:Twice a month Triggers:If she forgets to take her estradiol, fatigue, onset of tension headache. Relieving factors:  Turning lights down Activity:Cannot function.  She also has tension type headaches that begin at the back of her neck and radiate up to the front, non-throbbing, 3-4/10, lasting up to 3 days and occurring twice a month.  Over 2020, she has noted pressure in back of the neck into the shoulders but repetitive movements of neck and arms may aggravate a mild diffuse headache lasting all day and daily.  She can wake up with this headache.  She is not treating these headaches with analgesics.  She denies blurred vision or visual obscurations.  [facility] did not demonstrate papilledema or  other optic nerve involvement.  She notes intermittent "heart beat" in her ears.  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  No abnormal enhancement.  Left greater than right dilated optic nerve sheaths noted, as well as partial empty sella, raising suspicion of idiopathic intracranial hypertension.  She had a lumbar puncture on 03/21/19 which demonstrated a normal opening pressure of 8 cm H2O and closing pressure of 12 cm H2O.  CSF analysis showed cell count 1, protein 35, glucose 56, negative culture and no oligoclonal bands.  Past NSAIDS:Cannot take most NSAIDs due to gastric bypass surgery Past analgesics:no Past abortive triptans:Sumatriptan tablet, Treximet (side effects) Past muscle relaxants:no Past anti-emetic:no Past antihypertensive medications:no Past antidepressant medications:no Past anticonvulsant medications:topiramate 111m Past vitamins/Herbal/Supplements:no Other past therapies:no  Family history of headache:mom  She has reported memory deficits that precede initiation of topiramate. She has word-finding difficulty. Sometimes she cannot recall the name of people she knows. She will sometimes briefly get disoriented while driving.  Labs from February and March 2020 showed TSH 1.12, B12 409, B1 11 and B2 6.5.  She does have known iron deficiency anemia.  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 She has maintained over 100 pound weight loss.   Varicose veins    Vertigo     Medications: Outpatient Encounter Medications as of 08/03/2019  Medication Sig Note   ALPRAZolam (XANAX) 0.5 MG tablet TAKE 1/2 TO 1 TABLET BY MOUTH AS NEEDED FOR SLEEP OR ANXIETY    blood glucose meter kit and supplies KIT Dispense based on patient and insurance preference. Use up to four times daily as directed. (FOR ICD-9 250.00, 250.01).    buPROPion (WELLBUTRIN XL) 300 MG 24 hr tablet TAKE 1 TABLET BY MOUTH ONCE DAILY    Cyanocobalamin (B-12) 2500 MCG SUBL Place 2,500 mcg under the tongue daily. 12/06/2018: Pt states that she takes several times/week   desvenlafaxine (PRISTIQ) 100 MG 24 hr tablet TAKE 1 TABLET BY MOUTH ONCE DAILY    ergocalciferol (VITAMIN D2) 1.25 MG (50000 UT) capsule Take 1 capsule (50,000 Units total) by mouth 2 (two) times a week.    estradiol (VIVELLE-DOT) 0.1 MG/24HR patch Place 1 patch (0.1 mg total) onto the skin 2 (two) times a week.    Eszopiclone 3 MG TABS TAKE 1 TABLET BY MOUTH IMMEDIATELY BEFORE BEDTIME    Galcanezumab-gnlm (EMGALITY) 120 MG/ML SOAJ Inject 120 mg into the skin every 30 (thirty) days.    glucose blood (FREESTYLE INSULINX TEST) test strip Use as instructed    Lancets (FREESTYLE) lancets Use as instructed    Multiple Vitamins-Minerals (MULTIVITAMIN PO) Take 1 tablet by mouth 2 (two) times a week.     propranolol (INDERAL) 20 MG tablet Take 1 tablet (20 mg total) by mouth 3 (three) times daily.    spironolactone (ALDACTONE) 100 MG tablet Take 1 tablet (100 mg total) by mouth 2 (two) times daily.    tiZANidine (  ZANAFLEX) 2 MG tablet Take 1 tablet (2 mg total) by mouth at bedtime.    Facility-Administered Encounter Medications as  of 08/03/2019  Medication   Galcanezumab-gnlm SOAJ 120 mg    Allergies: Allergies  Allergen Reactions   Feraheme [Ferumoxytol] Itching    Iron infusion     Family History: Family History  Problem Relation Age of Onset   Coronary artery disease Mother    Heart disease Mother    Colon cancer Father    Coronary artery disease Father    Diabetes Father    Cancer Father        lung   Heart disease Father    Prostate cancer Paternal Grandfather    Cancer Paternal Grandfather        prostate   Cancer Maternal Uncle        throat   Lung cancer Paternal Grandmother    Cancer Maternal Grandmother        lung    Social History: Social History   Socioeconomic History   Marital status: Married    Spouse name: Merry Proud   Number of children: 2   Years of education: college   Highest education level: Not on file  Occupational History    Comment: Occupational psychologist strain: Not on file   Food insecurity    Worry: Not on file    Inability: Not on file   Transportation needs    Medical: Not on file    Non-medical: Not on file  Tobacco Use   Smoking status: Never Smoker   Smokeless tobacco: Never Used  Substance and Sexual Activity   Alcohol use: No    Alcohol/week: 0.0 standard drinks   Drug use: No   Sexual activity: Yes    Partners: Male    Birth control/protection: Surgical    Comment: Hyst  Lifestyle   Physical activity    Days per week: Not on file    Minutes per session: Not on file   Stress: Not on file  Relationships   Social connections    Talks on phone: Not on file    Gets together: Not on file    Attends religious service: Not on file    Active member of club or organization: Not on file    Attends meetings of clubs or organizations: Not on file    Relationship status: Not on file   Intimate partner violence    Fear of current or ex partner: Not on file    Emotionally abused: Not on file     Physically abused: Not on file    Forced sexual activity: Not on file  Other Topics Concern   Not on file  Social History Narrative   Patient lives at home with her husband Merry Proud). Patient has two children and works full time for [employer].   Caffeine patient drinks a very mild amount not daily.    Observations/Objective:   *** No acute distress.  Alert and oriented.  Speech fluent and not dysarthric.  Language intact.  Eyes orthophoric on primary gaze.  Face symmetric.  Assessment and Plan:   1.  Subjective memory deficits.  *** 2.  Tension-type headache, not intractable, possibly cervicogenic in nature 3.  Migraine without aura, without status migrainosus, not intractable  1.  For preventative management, *** 2.  For abortive therapy, *** 3.  Limit use of pain relievers to no more than 2  days out of week to prevent risk of rebound or medication-overuse headache. 4.  Keep headache diary 5.  Exercise, hydration, caffeine cessation, sleep hygiene, monitor for and avoid triggers 6.  Consider:  magnesium citrate 44m daily, riboflavin 4018mdaily, and coenzyme Q10 10019mhree times daily 7. Always keep in mind that currently taking a hormone or birth control may be a possible trigger or aggravating factor for migraine. 8. Follow up ***   Follow Up Instructions:    -I discussed the assessment and treatment plan with the patient. The patient was provided an opportunity to ask questions and all were answered. The patient agreed with the plan and demonstrated an understanding of the instructions.   The patient was advised to call back or seek an in-person evaluation if the symptoms worsen or if the condition fails to improve as anticipated.    Total Time spent in visit with the patient was:  ***   AdaDudley MajorO

## 2019-08-02 DIAGNOSIS — R05 Cough: Secondary | ICD-10-CM | POA: Diagnosis not present

## 2019-08-02 DIAGNOSIS — R0602 Shortness of breath: Secondary | ICD-10-CM | POA: Diagnosis not present

## 2019-08-02 DIAGNOSIS — J3489 Other specified disorders of nose and nasal sinuses: Secondary | ICD-10-CM | POA: Diagnosis not present

## 2019-08-03 ENCOUNTER — Telehealth: Payer: 59 | Admitting: Neurology

## 2019-08-07 ENCOUNTER — Other Ambulatory Visit: Payer: Self-pay | Admitting: Family Medicine

## 2019-08-07 ENCOUNTER — Other Ambulatory Visit: Payer: Self-pay | Admitting: Neurology

## 2019-08-07 MED FILL — tiZANidine HCL 2 MG TABS: 2 | 30 days supply | Qty: 30 | Fill #0

## 2019-08-07 NOTE — Telephone Encounter (Signed)
Requested Prescriptions   Pending Prescriptions Disp Refills  . tiZANidine (ZANAFLEX) 2 MG tablet [Pharmacy Med Name: tiZANidine HCL 2 MG TABS 2 Tablet] 30 tablet 3    Sig: TAKE 1 TABLET (2 MG TOTAL) BY MOUTH AT BEDTIME.   Rx last filled: 03/30/19 #30 2 refills  Pt last seen: 6/11/202  Follow up appt scheduled:08/17/19

## 2019-08-07 NOTE — Telephone Encounter (Signed)
Last seen 05/26/19  F/u on 08/09/19 Last script 03/17/19 #45

## 2019-08-08 MED FILL — ALPRAZolam 0.5 MG TABS: 0.5 | 45 days supply | Qty: 45 | Fill #0

## 2019-08-09 ENCOUNTER — Ambulatory Visit: Payer: 59 | Admitting: Family Medicine

## 2019-08-09 NOTE — Progress Notes (Deleted)
Judith Holland is a 53 y.o. female is here for follow up.  History of Present Illness:   (SCRIBE ATTESTATION)  HPI:   Health Maintenance Due  Topic Date Due  . HIV Screening  07/25/1981  . INFLUENZA VACCINE  05/20/2019   No flowsheet data found. PMHx, SurgHx, SocialHx, FamHx, Medications, and Allergies were reviewed in the Visit Navigator and updated as appropriate.   Patient Active Problem List   Diagnosis Date Noted  . Open angle with borderline findings, high risk, bilateral 06/04/2019  . Age-related nuclear cataract of both eyes 06/04/2019  . Primary insomnia 12/11/2018  . Bilateral hearing loss 12/11/2018  . Morbid obesity ([place]), BMI 37 plus comorbid conditions 12/11/2018  . Leukopenia 12/11/2018  . Hypoglycemic reaction 12/11/2018  . Cervicogenic headache 12/11/2018  . IDA (iron deficiency anemia), requiring iron infusions 09/22/2017  . Malabsorption of iron 09/22/2017  . Stress incontinence 09/11/2015  . Endometrial adenocarcinoma [facility]), s/p radical hysterectomy with BSO and lymph node biopsy 02/10/2012  . Allergic rhinitis 10/02/2007   Social History   Tobacco Use  . Smoking status: Never Smoker  . Smokeless tobacco: Never Used  Substance Use Topics  . Alcohol use: No    Alcohol/week: 0.0 standard drinks  . Drug use: No   Current Medications and Allergies   Current Outpatient Medications:  .  ALPRAZolam (XANAX) 0.5 MG tablet, TAKE 1/2 TO 1 TABLET BY MOUTH AS NEEDED FOR SLEEP OR ANXIETY, Disp: 45 tablet, Rfl: 1 .  blood glucose meter kit and supplies KIT, Dispense based on patient and insurance preference. Use up to four times daily as directed. (FOR ICD-9 250.00, 250.01)., Disp: 1 each, Rfl: 0 .  buPROPion (WELLBUTRIN XL) 300 MG 24 hr tablet, TAKE 1 TABLET BY MOUTH ONCE DAILY, Disp: 90 tablet, Rfl: 4 .  Cyanocobalamin (B-12) 2500 MCG SUBL, Place 2,500 mcg under the tongue daily., Disp: 30 tablet, Rfl: 11 .  desvenlafaxine (PRISTIQ) 100 MG 24 hr tablet,  TAKE 1 TABLET BY MOUTH ONCE DAILY, Disp: 90 tablet, Rfl: 4 .  ergocalciferol (VITAMIN D2) 1.25 MG (50000 UT) capsule, Take 1 capsule (50,000 Units total) by mouth 2 (two) times a week., Disp: 16 capsule, Rfl: 6 .  estradiol (VIVELLE-DOT) 0.1 MG/24HR patch, Place 1 patch (0.1 mg total) onto the skin 2 (two) times a week., Disp: 24 patch, Rfl: 4 .  Eszopiclone 3 MG TABS, TAKE 1 TABLET BY MOUTH IMMEDIATELY BEFORE BEDTIME, Disp: 90 tablet, Rfl: 1 .  Galcanezumab-gnlm (EMGALITY) 120 MG/ML SOAJ, Inject 120 mg into the skin every 30 (thirty) days., Disp: 3 pen, Rfl: 3 .  glucose blood (FREESTYLE INSULINX TEST) test strip, Use as instructed, Disp: 100 each, Rfl: 12 .  Lancets (FREESTYLE) lancets, Use as instructed, Disp: 100 each, Rfl: 12 .  Multiple Vitamins-Minerals (MULTIVITAMIN PO), Take 1 tablet by mouth 2 (two) times a week. , Disp: , Rfl:  .  propranolol (INDERAL) 20 MG tablet, Take 1 tablet (20 mg total) by mouth 3 (three) times daily., Disp: 90 tablet, Rfl: 1 .  spironolactone (ALDACTONE) 100 MG tablet, Take 1 tablet (100 mg total) by mouth 2 (two) times daily., Disp: 180 tablet, Rfl: 4 .  tiZANidine (ZANAFLEX) 2 MG tablet, TAKE 1 TABLET (2 MG TOTAL) BY MOUTH AT BEDTIME., Disp: 30 tablet, Rfl: 0  Current Facility-Administered Medications:  Donah Driver SOAJ 120 mg, 120 mg, Subcutaneous, Once, Briscoe Deutscher, DO  Allergies  Allergen Reactions  . Feraheme [Ferumoxytol] Itching    Iron infusion  Review of Systems   Pertinent items are noted in the HPI. Otherwise, a complete ROS is negative.  Vitals  There were no vitals filed for this visit.   There is no height or weight on file to calculate BMI.  Physical Exam   Physical Exam  Results for orders placed or performed in visit on 05/26/19  CBC with Differential/Platelet  Result Value Ref Range   WBC 4.6 3.8 - 10.8 Thousand/uL   RBC 4.29 3.80 - 5.10 Million/uL   Hemoglobin 14.5 11.7 - 15.5 g/dL   HCT 40.4 35.0 - 45.0 %    MCV 94.2 80.0 - 100.0 fL   MCH 33.8 (H) 27.0 - 33.0 pg   MCHC 35.9 32.0 - 36.0 g/dL   RDW 12.8 11.0 - 15.0 %   Platelets 200 140 - 400 Thousand/uL   MPV 12.0 7.5 - 12.5 fL   Neutro Abs 2,847 1,500 - 7,800 cells/uL   Lymphs Abs 1,219 850 - 3,900 cells/uL   Absolute Monocytes 474 200 - 950 cells/uL   Eosinophils Absolute 41 15 - 500 cells/uL   Basophils Absolute 18 0 - 200 cells/uL   Neutrophils Relative % 61.9 %   Total Lymphocyte 26.5 %   Monocytes Relative 10.3 %   Eosinophils Relative 0.9 %   Basophils Relative 0.4 %  Comprehensive metabolic panel  Result Value Ref Range   Glucose, Bld 87 65 - 99 mg/dL   BUN 13 7 - 25 mg/dL   Creat 0.89 0.50 - 1.05 mg/dL   BUN/Creatinine Ratio NOT APPLICABLE 6 - 22 (calc)   Sodium 137 135 - 146 mmol/L   Potassium 3.9 3.5 - 5.3 mmol/L   Chloride 108 98 - 110 mmol/L   CO2 27 20 - 32 mmol/L   Calcium 9.2 8.6 - 10.4 mg/dL   Total Protein 6.3 6.1 - 8.1 g/dL   Albumin 4.2 3.6 - 5.1 g/dL   Globulin 2.1 1.9 - 3.7 g/dL (calc)   AG Ratio 2.0 1.0 - 2.5 (calc)   Total Bilirubin 0.7 0.2 - 1.2 mg/dL   Alkaline phosphatase (APISO) 70 37 - 153 U/L   AST 26 10 - 35 U/L   ALT 27 6 - 29 U/L  Iron, TIBC and Ferritin Panel  Result Value Ref Range   Iron 180 (H) 45 - 160 mcg/dL   TIBC 365 250 - 450 mcg/dL (calc)   %SAT 49 (H) 16 - 45 % (calc)   Ferritin 98 16 - 232 ng/mL  Lipid panel  Result Value Ref Range   Cholesterol 179 <200 mg/dL   HDL 59 > OR = 50 mg/dL   Triglycerides 83 <150 mg/dL   LDL Cholesterol (Calc) 103 (H) mg/dL (calc)   Total CHOL/HDL Ratio 3.0 <5.0 (calc)   Non-HDL Cholesterol (Calc) 120 <130 mg/dL (calc)    Assessment and Plan   There are no diagnoses linked to this encounter.  . Orders and follow up as documented in [place], reviewed diet, exercise and weight control, cardiovascular risk and specific lipid/LDL goals reviewed, reviewed medications and side effects in detail.  . Reviewed expectations re: course of current  medical issues. . Outlined signs and symptoms indicating need for more acute intervention. . Patient verbalized understanding and all questions were answered. . Patient received an After Visit Summary.  *** CMA served as Education administrator during this visit. History, Physical, and Plan performed by medical provider. The above documentation has been reviewed and is accurate and complete. Briscoe Deutscher, D.O.  Danae Chen  Juleen China, DO [place] [place] 08/09/2019

## 2019-08-11 ENCOUNTER — Ambulatory Visit: Payer: 59 | Admitting: Family Medicine

## 2019-08-11 ENCOUNTER — Other Ambulatory Visit: Payer: Self-pay

## 2019-08-11 MED ORDER — SERTRALINE HCL 50 MG PO TABS: 50.0000 mg | ORAL_TABLET | Freq: Every day | ORAL | 1 refills | Status: DC

## 2019-08-11 NOTE — Progress Notes (Deleted)
Virtual Visit via Video   Due to the COVID-19 pandemic, this visit was completed with telemedicine (audio/video) technology to reduce patient and provider exposure as well as to preserve personal protective equipment.   I connected with Judith Holland by a video enabled telemedicine application and verified that I am speaking with the correct person using two identifiers. Location patient: Home Location provider: [facility], Office Persons participating in the virtual visit: KATHYJO BRIERE, Briscoe Deutscher, DO   I discussed the limitations of evaluation and management by telemedicine and the availability of in person appointments. The patient expressed understanding and agreed to proceed.  Care Team   Patient Care Team: Briscoe Deutscher, DO as PCP - General (Family Medicine) Calvert Cantor, MD as Consulting Physician (Ophthalmology)  Subjective:   HPI:   ROS   Patient Active Problem List   Diagnosis Date Noted  . Open angle with borderline findings, high risk, bilateral 06/04/2019  . Age-related nuclear cataract of both eyes 06/04/2019  . Primary insomnia 12/11/2018  . Bilateral hearing loss 12/11/2018  . Morbid obesity ([place]), BMI 37 plus comorbid conditions 12/11/2018  . Leukopenia 12/11/2018  . Hypoglycemic reaction 12/11/2018  . Cervicogenic headache 12/11/2018  . IDA (iron deficiency anemia), requiring iron infusions 09/22/2017  . Malabsorption of iron 09/22/2017  . Stress incontinence 09/11/2015  . 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 Allergic rhinitis 10/02/2007    Social History   Tobacco Use  . Smoking status: Never Smoker  . Smokeless tobacco: Never Used  Substance Use Topics  . Alcohol use: No    Alcohol/week: 0.0 standard drinks    Current Outpatient Medications:  .  ALPRAZolam (XANAX) 0.5 MG tablet, TAKE 1/2 TO 1 TABLET BY MOUTH AS NEEDED FOR SLEEP OR ANXIETY, Disp: 45 tablet, Rfl: 1 .   blood glucose meter kit and supplies KIT, Dispense based on patient and insurance preference. Use up to four times daily as directed. (FOR ICD-9 250.00, 250.01)., Disp: 1 each, Rfl: 0 .  buPROPion (WELLBUTRIN XL) 300 MG 24 hr tablet, TAKE 1 TABLET BY MOUTH ONCE DAILY, Disp: 90 tablet, Rfl: 4 .  Cyanocobalamin (B-12) 2500 MCG SUBL, Place 2,500 mcg under the tongue daily., Disp: 30 tablet, Rfl: 11 .  desvenlafaxine (PRISTIQ) 100 MG 24 hr tablet, TAKE 1 TABLET BY MOUTH ONCE DAILY, Disp: 90 tablet, Rfl: 4 .  ergocalciferol (VITAMIN D2) 1.25 MG (50000 UT) capsule, Take 1 capsule (50,000 Units total) by mouth 2 (two) times a week., Disp: 16 capsule, Rfl: 6 .  estradiol (VIVELLE-DOT) 0.1 MG/24HR patch, Place 1 patch (0.1 mg total) onto the skin 2 (two) times a week., Disp: 24 patch, Rfl: 4 .  Eszopiclone 3 MG TABS, TAKE 1 TABLET BY MOUTH IMMEDIATELY BEFORE BEDTIME, Disp: 90 tablet, Rfl: 1 .  Galcanezumab-gnlm (EMGALITY) 120 MG/ML SOAJ, Inject 120 mg into the skin every 30 (thirty) days., Disp: 3 pen, Rfl: 3 .  glucose blood (FREESTYLE INSULINX TEST) test strip, Use as instructed, Disp: 100 each, Rfl: 12 .  Lancets (FREESTYLE) lancets, Use as instructed, Disp: 100 each, Rfl: 12 .  Multiple Vitamins-Minerals (MULTIVITAMIN PO), Take 1 tablet by mouth 2 (two) times a week. , Disp: , Rfl:  .  propranolol (INDERAL) 20 MG tablet, Take 1 tablet (20 mg total) by mouth 3 (three) times daily., Disp: 90 tablet, Rfl: 1 .  spironolactone (ALDACTONE) 100 MG tablet, Take 1 tablet (100 mg total) by mouth 2 (two)  times daily., Disp: 180 tablet, Rfl: 4 .  tiZANidine (ZANAFLEX) 2 MG tablet, TAKE 1 TABLET (2 MG TOTAL) BY MOUTH AT BEDTIME., Disp: 30 tablet, Rfl: 0  Current Facility-Administered Medications:  Donah Driver SOAJ 120 mg, 120 mg, Subcutaneous, Once, Briscoe Deutscher, DO  Allergies  Allergen Reactions  . Feraheme [Ferumoxytol] Itching    Iron infusion     Objective:   VITALS: Per patient if  applicable, see vitals. GENERAL: Alert, appears well and in no acute distress. HEENT: Atraumatic, conjunctiva clear, no obvious abnormalities on inspection of external nose and ears. NECK: Normal movements of the head and neck. CARDIOPULMONARY: No increased WOB. Speaking in clear sentences. I:E ratio WNL.  MS: Moves all visible extremities without noticeable abnormality. PSYCH: Pleasant and cooperative, well-groomed. Speech normal rate and rhythm. Affect is appropriate. Insight and judgement are appropriate. Attention is focused, linear, and appropriate.  NEURO: CN grossly intact. Oriented as arrived to appointment on time with no prompting. Moves both UE equally.  SKIN: No obvious lesions, wounds, erythema, or cyanosis noted on face or hands.  No flowsheet data found.  Assessment and Plan:   There are no diagnoses linked to this encounter.  Marland Kitchen COVID-19 Education: The signs and symptoms of COVID-19 were discussed with the patient and how to seek care for testing if needed. The importance of social distancing was discussed today. . Reviewed expectations re: course of current medical issues. . Discussed self-management of symptoms. . Outlined signs and symptoms indicating need for more acute intervention. . Patient verbalized understanding and all questions were answered. Marland Kitchen Health Maintenance issues including appropriate healthy diet, exercise, and smoking avoidance were discussed with patient. . See orders for this visit as documented in the electronic medical record.  Briscoe Deutscher, DO  Records requested if needed. Time spent: *** minutes, of which >50% was spent in obtaining information about her symptoms, reviewing her previous labs, evaluations, and treatments, counseling her about her condition (please see the discussed topics above), and developing a plan to further investigate it; she had a number of questions which I addressed.

## 2019-08-14 MED FILL — EMGALITY 120 MG/ML SOAJ: 120 | 84 days supply | Qty: 3 | Fill #0

## 2019-08-15 MED FILL — PROPRANOLOL 20 MG TABLET: 20 | 30 days supply | Qty: 90 | Fill #1

## 2019-08-15 NOTE — Progress Notes (Addendum)
Virtual Visit via Video Note The purpose of this virtual visit is to provide medical care while limiting exposure to the novel coronavirus.    Consent was obtained for video visit:  Yes.   Answered questions that patient had about telehealth interaction:  Yes.   I discussed the limitations, risks, security and privacy concerns of performing an evaluation and management service by telemedicine. I also discussed with the patient that there may be a patient responsible charge related to this service. The patient expressed understanding and agreed to proceed.  Pt location: Home Physician Location: Home Name of referring provider:  Briscoe Deutscher, DO I connected with Judith Holland at patients initiation/request on 08/17/2019 at  7:50 AM EDT by video enabled telemedicine application and verified that I am speaking with the correct person using two identifiers. Pt MRN:  [id number] Pt DOB:  11/16/1965 Video Participants:  Judith Holland   History of Present Illness:  Judith Holland is a 53 year old woman who follows up for headaches and abnormal brain MRI.  UPDATE: I  Memory Deficits:  She underwent neuropsychological evaluation on 07/18/2019.  Testing demonstrated relative weaknesses across a task assessing spatial manipulation, as well as a task assessing concept formation and problem solving.  However these scores remain within normal limits.   II Headaches: Her PCP started her on Emgality and stopped Qudexy in case it was contributing to memory problems.   She has had some constipation and hasn't noticed change in memory.  She is status post 3 injections   Intensity:  mild Duration:  Couple of hours Frequency:  4 mild headaches a month (no severe migraines) Current NSAIDS:diclofenac Current analgesics:no Current triptans:rizatriptan 60m Current anti-emetic:promethazine Current muscle relaxants:tizanidine 276mat bedtime (neck pain) Current anti-anxiolytic:alprazolam  Current sleep aide:eszopiclone Current Antihypertensive medications:propranolol 2065mID (palpitations, anxiety); spironolactone Current Antidepressant medications:Bupropion, Pristiq Current Anticonvulsant medications: Current CGRP inhibitor:  Emgality Current Vitamins/Herbal/Supplements:MVI, D, B12 Current Antihistamines/Decongestants:Flonase Other therapy:no Hormone/birth control:  Estradiol  Caffeine:tea Alcohol:no Smoker:no Diet:hydrates Exercise:no Depression/anxiety: grieving since passing of her mother in September. Sleep hygiene:Okay with Lunesta  HISTORY: Onset:  She has had headaches on and off since young adulthood. They became more frequent in 2016. Around that time, she also began experiencing memory deficits as well as poor coordination and vertigo. 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 MRI of brain without contrast from 08/22/15 revealed nonspecific small foci of T2 hyperintensity in the cerebral white matter.She reportedly had a repeat MRI in November 2017, but a comparison was not made. MS was considered. As for the headaches: Location:Above the right eye Quality:sharp Initial intensity:7/10 Aura:no Prodrome:no Postdrome:no Associated symptoms:  Photophobia. Sometimes nausea. No photophobia or visual disturbance. She has not had any new worse headache of her life, waking up from sleep Initial Duration:1 to 2 days Initial Frequency:Twice a month Triggers:If she forgets to take her estradiol, fatigue, onset of tension headache. Relieving factors:  Turning lights down Activity:Cannot function.  She also has tension type headaches that begin at the back of her neck and radiate up to the front, non-throbbing, 3-4/10, lasting up to 3 days and occurring twice a month.  She also endorses pressure in back of the neck into the shoulders but repetitive movements  of neck and arms may aggravate a mild diffuse headache lasting all day and daily.  She can wake up with this headache.  She is not treating these headaches with analgesics.  She denies blurred vision  or visual obscurations.  She [facility]mology [facility] last Friday which did not demonstrate papilledema or other optic nerve involvement.  She notes intermittent "heart beat" in her ears.  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  No abnormal enhancement.  Left greater than right dilated optic nerve sheaths noted, as well as partial empty sella, raising suspicion of idiopathic intracranial hypertension.  She had a lumbar puncture on 03/21/19 which demonstrated a normal opening pressure of 8 cm H2O and closing pressure of 12 cm H2O.  CSF analysis showed cell count 1, protein 35, glucose 56, negative culture and no oligoclonal bands.  Past NSAIDS:Cannot take most NSAIDs due to gastric bypass surgery Past analgesics:no Past abortive triptans:Sumatriptan tablet, Treximet (side effects) Past muscle relaxants:no Past anti-emetic:no Past antihypertensive medications:no Past antidepressant medications:no Past anticonvulsant medications:topiramate 165m, Qudexy XR 1522m(effective, stopping did not help memory) Past vitamins/Herbal/Supplements:no Other past therapies:no  Family history of headache:mom  She has reported memory deficits that precede initiation of topiramate. She has word-finding difficulty. Sometimes she cannot recall the name of people she knows. She will sometimes briefly get disoriented while  driving.  Labs from February and March showed TSH 1.12, B12 409, B1 11 and B2 6.5.  She does have known iron deficiency anemia.  Past Medical History: Past Medical History:  Diagnosis Date  . Elevated eye pressure   . Endometrioid adenocarcinoma    Stage IA grade 1  . IDA (iron deficiency anemia) 09/22/2017   Requiring iron infusions  . Infertility, female    clomid and IUI x 3  . Menorrhagia   . Migraine headaches   . [place]  . Rapid heartbeat   . 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 She has maintained over 100 pound weight loss.  . Varicose veins   . Vertigo     Medications: Outpatient Encounter Medications as of 08/17/2019  Medication Sig Note  . ALPRAZolam (XANAX) 0.5 MG tablet TAKE 1/2 TO 1 TABLET BY MOUTH AS NEEDED FOR SLEEP OR ANXIETY   . blood glucose meter kit and supplies KIT Dispense based on patient and insurance preference. Use up to four times daily as directed. (FOR ICD-9 250.00, 250.01).   . Marland KitchenuPROPion (WELLBUTRIN XL) 300 MG 24 hr tablet TAKE 1 TABLET BY MOUTH ONCE DAILY   . Cyanocobalamin (B-12) 2500 MCG SUBL Place 2,500 mcg under the tongue daily. 12/06/2018: Pt states that she takes several times/week  . desvenlafaxine (PRISTIQ) 100 MG 24 hr tablet TAKE 1 TABLET BY MOUTH ONCE DAILY   . ergocalciferol (VITAMIN D2) 1.25 MG (50000 UT) capsule Take 1 capsule (50,000 Units total) by mouth 2 (two) times a week.   . estradiol (VIVELLE-DOT) 0.1 MG/24HR patch Place 1 patch (0.1 mg total) onto the skin 2 (two) times a week.   . Eszopiclone 3 MG TABS TAKE 1 TABLET BY MOUTH IMMEDIATELY BEFORE BEDTIME   . Galcanezumab-gnlm (EMGALITY) 120 MG/ML SOAJ Inject 120 mg into the skin every 30 (thirty) days.   . Marland Kitchenlucose blood (FREESTYLE INSULINX TEST) test strip Use as instructed   . Lancets (FREESTYLE) lancets Use  as instructed   . Multiple Vitamins-Minerals (MULTIVITAMIN PO) Take 1 tablet by mouth 2  (two) times a week.    . propranolol (INDERAL) 20 MG tablet Take 1 tablet (20 mg total) by mouth 3 (three) times daily.   . sertraline (ZOLOFT) 50 MG tablet Take 1 tablet (50 mg total) by mouth daily.   Marland Kitchen spironolactone (ALDACTONE) 100 MG tablet Take 1 tablet (100 mg total) by mouth 2 (two) times daily.   Marland Kitchen tiZANidine (ZANAFLEX) 2 MG tablet TAKE 1 TABLET (2 MG TOTAL) BY MOUTH AT BEDTIME.    Facility-Administered Encounter Medications as of 08/17/2019  Medication  . Galcanezumab-gnlm SOAJ 120 mg    Allergies: Allergies  Allergen Reactions  . Feraheme [Ferumoxytol] Itching    Iron infusion     Family History: Family History  Problem Relation Age of Onset  . Coronary artery disease Mother   . Heart disease Mother   . Colon cancer Father   . Coronary artery disease Father   . Diabetes Father   . Cancer Father        lung  . Heart disease Father   . Prostate cancer Paternal Grandfather   . Cancer Paternal Grandfather        prostate  . Cancer Maternal Uncle        throat  . Lung cancer Paternal Grandmother   . Cancer Maternal Grandmother        lung    Social History: Social History   Socioeconomic History  . Marital status: Married    Spouse name: Merry Proud  . Number of children: 2  . Years of education: college  . Highest education level: Not on file  Occupational History    Comment: [employer]  . Financial resource strain: Not on file  . Food insecurity    Worry: Not on file    Inability: Not on file  . Transportation needs    Medical: Not on file    Non-medical: Not on file  Tobacco Use  . Smoking status: Never Smoker  . Smokeless tobacco: Never Used  Substance and Sexual Activity  . Alcohol use: No    Alcohol/week: 0.0 standard drinks  . Drug use: No  . Sexual activity: Yes    Partners: Male    Birth control/protection: Surgical    Comment: Hyst  Lifestyle  . Physical activity    Days per week: Not on file    Minutes per session:  Not on file  . Stress: Not on file  Relationships  . Social Herbalist on phone: Not on file    Gets together: Not on file    Attends religious service: Not on file    Active member of club or organization: Not on file    Attends meetings of clubs or organizations: Not on file    Relationship status: Not on file  . Intimate partner violence    Fear of current or ex partner: Not on file    Emotionally abused: Not on file    Physically abused: Not on file    Forced sexual activity: Not on file  Other Topics Concern  . Not on file  Social History Narrative   Patient lives at home with her husband Merry Proud). Patient has two children and works full time for [employer].   Caffeine patient drinks a very mild amount not daily.    Observations/Objective:   Height 5' 7"  (  1.702 m), weight 240 lb (108.9 kg), last menstrual period 09/18/2009. No acute distress.  Alert and oriented.  Speech fluent and not dysarthric.  Language intact.  Eyes orthophoric on primary gaze.  Face symmetric.  Assessment and Plan:   1.  Subjective memory deficits.  No evidence of a neurodegenerative or secondary organic etiology. 2.  Migraine without aura, without status migrainosus, not intractable 3.  Abnormal white matter on brain MRI  1.  Given her history of gastric bypass and concern for potential bowel obstruction, we will stop Emgality and restart Qudexy.  We will provide her samples of 26m, 1 capsule daily for a week, then 2 capsules daily for a week, then restart 1513mcapsule at bedtime.  The Qudexy apparently did not contribute to her memory.  ADDENDUM:  We do not have samples of Qudexy 5080mso I also prescribed a titrating prescription for Qudexy 26m67md she will then start Qudexy 126mg33mly once the titration is finished. 2.  For abortive therapy, rizatriptan 10mg 16mtizanidine 2mg as32meded. 3. Tizanidine 2mg at 30mtime for neck pain. 4.  Limit use of pain  relievers to no more than 2 days out of week to prevent risk of rebound or medication-overuse headache. 5.  Keep headache diary 6.  Exercise, hydration, caffeine cessation, sleep hygiene, monitor for and avoid triggers 7.  Consider:  magnesium citrate 400mg dai62mriboflavin 400mg dail34mnd coenzyme Q10 100mg three41mes daily 8. She will establish with new PCP (her previous PCP left) to discuss changes in antidepressants.  She is seeing a therapist for anxiety. 9. Repeat MRI of brain with and without contrast in May to follow up on previous white matter changes. 10. Follow up 7 months.   Follow Up Instructions:    -I discussed the assessment and treatment plan with the patient. The patient was provided an opportunity to ask questions and all were answered. The patient agreed with the plan and demonstrated an understanding of the instructions.   The patient was advised to call back or seek an in-person evaluation if the symptoms worsen or if the condition fails to improve as anticipated.   Mory Herrman RobertDudley Major

## 2019-08-16 ENCOUNTER — Telehealth: Payer: Self-pay | Admitting: Family Medicine

## 2019-08-16 ENCOUNTER — Other Ambulatory Visit: Payer: Self-pay

## 2019-08-16 DIAGNOSIS — I83893 Varicose veins of bilateral lower extremities with other complications: Secondary | ICD-10-CM

## 2019-08-16 NOTE — Telephone Encounter (Signed)
Medication Refill - Medication: Galcanezumab-gnlm (EMGALITY) 120 MG/ML SOAJ  PA is needed, please advise  KEy: [id number]  Has the patient contacted their pharmacy? Yes.   (Agent: If no, request that the patient contact the pharmacy for the refill.) (Agent: If yes, when and what did the pharmacy advise?)

## 2019-08-16 NOTE — Telephone Encounter (Signed)
See note

## 2019-08-16 NOTE — Telephone Encounter (Signed)
Judith Holland has been received on  Judith Holland (Key: [id number]) Emgality 120MG /ML auto-injectors (migraine)   Form [employer] ePA Form Created 19 days ago Sent to Plan 19 days ago Plan Response 19 days ago Submit Clinical Questions 19 days ago Determination Favorable 15 days ago Message from Plan The request has been approved. The authorization is effective for a maximum of 1 fills from 07/26/2019 to 08/25/2019, as long as the member is enrolled in their current health plan. The request was approved as submitted. This request has been approved for 1 fill of up to 45mL per 30 days. An additional authorization has been entered for [facility] for 72mL (one 120mg /mL pen) per 30 days for 5 fills effective 08/18/2019 through 01/15/2020; please reference authorization [id number]. Renewal requires that the patient has experienced a reduction in migraine or headache frequency of at least 2 days per month OR the patient has experienced a reduction in migraine severity OR migraine duration with Emgality therapy. A written notification letter will follow with additional details.

## 2019-08-17 ENCOUNTER — Telehealth (INDEPENDENT_AMBULATORY_CARE_PROVIDER_SITE_OTHER): Payer: 59 | Admitting: Neurology

## 2019-08-17 ENCOUNTER — Other Ambulatory Visit: Payer: Self-pay

## 2019-08-17 ENCOUNTER — Other Ambulatory Visit: Payer: Self-pay | Admitting: Neurology

## 2019-08-17 ENCOUNTER — Encounter: Payer: Self-pay | Admitting: Neurology

## 2019-08-17 VITALS — Ht 67.0 in | Wt 240.0 lb

## 2019-08-17 DIAGNOSIS — R4189 Other symptoms and signs involving cognitive functions and awareness: Secondary | ICD-10-CM | POA: Diagnosis not present

## 2019-08-17 DIAGNOSIS — R9082 White matter disease, unspecified: Secondary | ICD-10-CM

## 2019-08-17 DIAGNOSIS — G43009 Migraine without aura, not intractable, without status migrainosus: Secondary | ICD-10-CM

## 2019-08-17 MED ORDER — TOPIRAMATE ER 150 MG PO SPRINKLE CAP24: 150.0000 mg | EXTENDED_RELEASE_CAPSULE | Freq: Every day | ORAL | 6 refills | Status: DC

## 2019-08-17 MED ORDER — TIZANIDINE HCL 2 MG PO TABS: ORAL_TABLET | ORAL | 6 refills | Status: DC

## 2019-08-17 MED ORDER — TOPIRAMATE ER 50 MG PO SPRINKLE CAP24: EXTENDED_RELEASE_CAPSULE | ORAL | 0 refills | Status: DC

## 2019-08-17 NOTE — Addendum Note (Signed)
Addended by: Ranae Plumber on: 08/17/2019 08:56 AM   Modules accepted: Orders

## 2019-08-18 ENCOUNTER — Ambulatory Visit: Payer: 59 | Admitting: Licensed Clinical Social Worker

## 2019-08-22 ENCOUNTER — Inpatient Hospital Stay (HOSPITAL_COMMUNITY): Admission: RE | Admit: 2019-08-22 | Payer: 59 | Source: Ambulatory Visit

## 2019-08-22 ENCOUNTER — Encounter: Payer: 59 | Admitting: Vascular Surgery

## 2019-08-23 ENCOUNTER — Encounter: Payer: Self-pay | Admitting: *Deleted

## 2019-08-23 NOTE — Progress Notes (Signed)
Received fax from [place] with approval ref # [id number] Valid 08/22/2019-08/20/2020  Letter sent to scan into her chart

## 2019-08-30 ENCOUNTER — Telehealth: Payer: Self-pay | Admitting: Neurology

## 2019-08-30 MED FILL — TOPIRAMATE ER 50 MG CAPSULE: 50 | 21 days supply | Qty: 21 | Fill #0

## 2019-08-30 MED FILL — TOPIRAMATE ER 150 MG CAP: 150 | 90 days supply | Qty: 90 | Fill #2

## 2019-08-30 NOTE — Telephone Encounter (Signed)
It's fine to wait until tomorrow to pick up the Qudexy.  Just missing one more dose will not make a significant difference.  Also, taking the Emgality now won't provide immediate relief.

## 2019-08-30 NOTE — Telephone Encounter (Signed)
Called spoke with patient she was informed of provider response she agrees and will wait until tomorrow.

## 2019-08-30 NOTE — Telephone Encounter (Signed)
Patient called to check on the status of her topiramate ER prior authorization. She said [employer] still needs approval for this medication

## 2019-08-30 NOTE — Telephone Encounter (Signed)
PA has been done but she has been without medication for a week and she has a bad headache and they need to order medication- patient wanting to know if there is something else she can get or samples. Thanks!

## 2019-08-30 NOTE — Telephone Encounter (Signed)
Spoke with patient she would like to know if their is anything she can do until Rx is at pharmacy. Rx will be ready to pick tomorrow after 1:00 pm. Pt states she still has some Emgality at home. Is it ok to use until her Rx is ready?

## 2019-08-30 NOTE — Telephone Encounter (Signed)
[facility] spoke with pharmacist. Rx is not in stock they will order then contact patient to pick up. Pharmacy states that they spoke with patient about 45 mins ago regarding this.   We are out of samples in out clinic. Patient can try another pharmacy to see if they have it in stock.

## 2019-09-04 NOTE — Telephone Encounter (Signed)
Progress Notes by Azalee Course at 08/23/2019 7:52 AM Author: Azalee Course Author Type:  Filed: 08/23/2019 7:55 AM  Note Status: Signed Cosign: Cosign Not Required Encounter Date: 08/23/2019  Editor: Azalee Course    Received fax from [place] with approval ref # [id number] Valid 08/22/2019-08/20/2020  Letter sent to scan into her chart     Patient aware of this last week on 08/30/19 telephone note

## 2019-09-04 NOTE — Telephone Encounter (Signed)
Rerouting to [facility], Dr. Georgie Chard CMA.

## 2019-09-08 ENCOUNTER — Other Ambulatory Visit: Payer: Self-pay | Admitting: Obstetrics & Gynecology

## 2019-09-08 ENCOUNTER — Telehealth: Payer: Self-pay | Admitting: *Deleted

## 2019-09-08 MED ORDER — AMOXICILLIN-POT CLAVULANATE 875-125 MG PO TABS: 1.0000 | ORAL_TABLET | Freq: Two times a day (BID) | ORAL | 0 refills | Status: DC

## 2019-09-08 MED FILL — AMOX-CLAV 875-125 MG TABLET: 875-125 | 7 days supply | Qty: 14 | Fill #0

## 2019-09-08 NOTE — Telephone Encounter (Signed)
Spoke with pt. Pt aware of Rx. Thank you.   Will close encounter.

## 2019-09-08 NOTE — Telephone Encounter (Signed)
Spoke with patient. Patient reports sinus symptoms since 08/07/19, recently developed green nasal drainage. Negative Covid19 testing. Denies any other upper respiratory symptoms, fever/chills. Patient is requesting abx to pharmacy on file. Advised will review with provider and notify of recommendations.   Routing to Dr. Sabra Heck.

## 2019-09-08 NOTE — Telephone Encounter (Signed)
Rx sent to [facility] for augmenting 875 BID x 7 days.

## 2019-09-11 ENCOUNTER — Other Ambulatory Visit: Payer: Self-pay | Admitting: Family

## 2019-09-18 ENCOUNTER — Ambulatory Visit: Payer: Self-pay | Admitting: Obstetrics & Gynecology

## 2019-10-02 ENCOUNTER — Ambulatory Visit: Payer: 59 | Admitting: Obstetrics & Gynecology

## 2019-10-06 ENCOUNTER — Other Ambulatory Visit: Payer: Self-pay | Admitting: *Deleted

## 2019-10-06 NOTE — Telephone Encounter (Signed)
Spoke with patient. Patient request refill of Lunesta 3mg  tabs. Confirmed pharmacy on file.   Last AEX 09/12/18 Next AEX 12/15/19  Rx pended.   Routing to Dr. Sabra Heck

## 2019-10-10 DIAGNOSIS — H903 Sensorineural hearing loss, bilateral: Secondary | ICD-10-CM | POA: Diagnosis not present

## 2019-10-10 MED ORDER — ESZOPICLONE 3 MG PO TABS: ORAL_TABLET | ORAL | 1 refills | Status: DC

## 2019-10-10 MED FILL — ESZOPICLONE 3 MG TABLET: 3 | 90 days supply | Qty: 90 | Fill #0

## 2019-10-10 NOTE — Telephone Encounter (Signed)
Patient notified of refill.

## 2019-10-18 ENCOUNTER — Encounter: Payer: Self-pay | Admitting: Obstetrics & Gynecology

## 2019-10-18 ENCOUNTER — Other Ambulatory Visit: Payer: Self-pay | Admitting: Family Medicine

## 2019-10-18 DIAGNOSIS — G4489 Other headache syndrome: Secondary | ICD-10-CM

## 2019-10-18 DIAGNOSIS — Z1231 Encounter for screening mammogram for malignant neoplasm of breast: Secondary | ICD-10-CM | POA: Diagnosis not present

## 2019-10-18 MED FILL — BUPROPION HCL ER (XL) 300 M: 300 | 30 days supply | Qty: 30 | Fill #2

## 2019-10-18 MED FILL — tiZANidine HCL 2 MG TABS: 2 | 30 days supply | Qty: 60 | Fill #1

## 2019-10-19 ENCOUNTER — Other Ambulatory Visit: Payer: Self-pay | Admitting: Obstetrics & Gynecology

## 2019-10-19 DIAGNOSIS — E611 Iron deficiency: Secondary | ICD-10-CM

## 2019-10-19 DIAGNOSIS — E559 Vitamin D deficiency, unspecified: Secondary | ICD-10-CM

## 2019-10-20 LAB — VITAMIN D 25 HYDROXY (VIT D DEFICIENCY, FRACTURES): Vit D, 25-Hydroxy: 23 ng/mL — ABNORMAL LOW (ref 30.0–100.0)

## 2019-10-20 LAB — IRON,TIBC AND FERRITIN PANEL
Ferritin: 72 ng/mL (ref 15–150)
Iron Saturation: 20 % (ref 15–55)
Iron: 74 ug/dL (ref 27–159)
Total Iron Binding Capacity: 369 ug/dL (ref 250–450)
UIBC: 295 ug/dL (ref 131–425)

## 2019-10-27 ENCOUNTER — Other Ambulatory Visit: Payer: Self-pay

## 2019-10-27 ENCOUNTER — Other Ambulatory Visit: Payer: Self-pay | Admitting: Family Medicine

## 2019-10-27 DIAGNOSIS — G4489 Other headache syndrome: Secondary | ICD-10-CM

## 2019-10-27 MED ORDER — PROPRANOLOL HCL 20 MG PO TABS: 20.0000 mg | ORAL_TABLET | Freq: Three times a day (TID) | ORAL | 1 refills | Status: DC

## 2019-10-30 ENCOUNTER — Encounter: Payer: Self-pay | Admitting: Family Medicine

## 2019-10-30 ENCOUNTER — Ambulatory Visit (INDEPENDENT_AMBULATORY_CARE_PROVIDER_SITE_OTHER): Payer: BC Managed Care – PPO | Admitting: Family Medicine

## 2019-10-30 DIAGNOSIS — R930 Abnormal findings on diagnostic imaging of skull and head, not elsewhere classified: Secondary | ICD-10-CM

## 2019-10-30 DIAGNOSIS — Z9884 Bariatric surgery status: Secondary | ICD-10-CM

## 2019-10-30 DIAGNOSIS — E161 Other hypoglycemia: Secondary | ICD-10-CM

## 2019-10-30 DIAGNOSIS — E8940 Asymptomatic postprocedural ovarian failure: Secondary | ICD-10-CM | POA: Diagnosis not present

## 2019-10-30 DIAGNOSIS — G43009 Migraine without aura, not intractable, without status migrainosus: Secondary | ICD-10-CM

## 2019-10-30 DIAGNOSIS — F339 Major depressive disorder, recurrent, unspecified: Secondary | ICD-10-CM

## 2019-10-30 DIAGNOSIS — Z8542 Personal history of malignant neoplasm of other parts of uterus: Secondary | ICD-10-CM

## 2019-10-30 DIAGNOSIS — K909 Intestinal malabsorption, unspecified: Secondary | ICD-10-CM

## 2019-10-30 DIAGNOSIS — Z79890 Hormone replacement therapy (postmenopausal): Secondary | ICD-10-CM

## 2019-10-30 DIAGNOSIS — F5101 Primary insomnia: Secondary | ICD-10-CM

## 2019-10-30 HISTORY — DX: Major depressive disorder, recurrent, unspecified: F33.9

## 2019-10-30 MED ORDER — ALPRAZOLAM 0.5 MG PO TABS: 0.5000 mg | ORAL_TABLET | Freq: Every day | ORAL | 3 refills | Status: DC | PRN

## 2019-10-30 MED FILL — ALPRAZolam 0.5 MG TABS: 0.5 | 30 days supply | Qty: 30 | Fill #0

## 2019-10-30 NOTE — Progress Notes (Signed)
Virtual Visit via Video Note  Subjective  CC:  Chief Complaint  Patient presents with  . Transitions Of Care    former pt of dr wallace  . Depression  . Anxiety  . Headache     I connected with Judith Holland on 10/30/19 at  4:20 PM EST by a video enabled telemedicine application and verified that I am speaking with the correct person using two identifiers. Location patient: Home Location provider: [facility] [place], Office Persons participating in the virtual visit: Judith Holland, Leamon Arnt, MD Serita Sheller, [place] reviewed multiple records from gyn, neurology, lab and mri results, prior PCP. Updated PL in detail.  I discussed the limitations of evaluation and management by telemedicine and the availability of in person appointments. The patient expressed understanding and agreed to proceed. HPI: Judith Holland is a 54 y.o. female who was contacted today to address the problems listed above in the chief complaint. . 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  . Abnormal brain MRI , white matter changes, followed by neuro w/ recent normal neuropsychological testing which was reassuring. Concerned about memory; now thinking that stressors/anxiety could be contributing.  . Long h/o depression/anxiety on multiple meds documented in PL. Has been well controlled on wellbutrin high dose and pristiq for last 3-4 years. She had the change due to worsening sxs after the difficult passing of her mother. She is seeing a therapist now to help deal with some of the family issues. She admits to feeling more worried, overwhelmed and labile moods with sadness and trouble containing emotions more so over last 3-6 months; this is likely in part due to covid changes at home and work, new job responsibilities, and stressors from such. She  started using xanax regularly about 10 years ago when she got the diagnosis of endometrial adenocarcinoma. She continues to use it 3-5 days as week for anxiety/stressors. She requests refill. She has been on klonopin in the past for sleep; now uses lunesta (primary insomnia). Denies SI.  Marland Kitchen HRT due to prmature surgical menopause; reports stable.  . H/o gastric bypass w/o malabsorption issues, low iron and weight gain. Most recent labs stable and reviewed.  . H/o hypoglycemia and allergic reaction to heme infusions. Now better.    Assessment  1. Migraine without aura and without status migrainosus, not intractable   2. 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 Status post gastric bypass for obesity 2011   4. Abnormal MRI of head   5. Major depression, recurrent, chronic ([place])   6. Malabsorption of iron   7. Hypoglycemic reaction   8. Primary insomnia   9. History of endometrial cancer      Plan   Migraine::  Improved control; continue current mgt.   Memory concerns and abnl brain MRI: reassured by nl testing. ? Anxiety/stressors playing a role. Discussed options of care: elect to continue counseling and curretn ssri and wellbutrin. Refilled xanax but would recommend consideration of changing to non benzo like buspar chronically or changing to long acting benzo like valium or klonopin. Pt to consider. Short acting could be contributing to increase in anxiety on off days. Has f/u MRI in may; can then readdress if mood meds need to be changed.   Obesity: workign on diet. Will monitor for vitamin  deficiencies.   HRT is stable.   Insomnia is stable. I reviewed patient's records from the PMP aware controlled substance registry today.   H/o endometrial cancer: cured w/ hysterectomy.   I discussed the assessment and treatment plan with the patient. The patient was provided an opportunity to ask questions and all were answered. The patient agreed with the plan and demonstrated an understanding of  the instructions.   The patient was advised to call back or seek an in-person evaluation if the symptoms worsen or if the condition fails to improve as anticipated. Follow up: recheck mood / anxiety  01/12/2020  Meds ordered this encounter  Medications  . ALPRAZolam (XANAX) 0.5 MG tablet    Sig: Take 1 tablet (0.5 mg total) by mouth daily as needed for anxiety.    Dispense:  30 tablet    Refill:  3      I reviewed the patients updated PMH, FH, and SocHx.    Patient Active Problem List   Diagnosis Date Noted  . 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 Status post gastric bypass for obesity 2011 10/30/2019    Priority: High  . Abnormal MRI of head 10/30/2019    Priority: High  . Primary insomnia 12/11/2018    Priority: High  . History of endometrial cancer 02/10/2012    Priority: High  . Migraine headache without aura 10/30/2019    Priority: Medium  . Sensorineural hearing loss (SNHL), bilateral 12/11/2018    Priority: Medium  . Cervicogenic headache 12/11/2018    Priority: Medium  . Iron deficiency anemia secondary to inadequate dietary iron intake 09/22/2017    Priority: Medium  . Open angle with borderline findings, high risk, bilateral 06/04/2019    Priority: Low  . Age-related nuclear cataract of both eyes 06/04/2019    Priority: Low  . Stress incontinence 09/11/2015    Priority: Low  . Allergic rhinitis 10/02/2007    Priority: Low  . Major depression, recurrent, chronic ([place]) 10/30/2019  . Leukopenia 12/11/2018  . Hypoglycemic reaction 12/11/2018  . Malabsorption of iron 09/22/2017   No outpatient medications have been marked as taking for the 10/30/19 encounter (Office Visit) with Leamon Arnt, MD.    Allergies: Patient is allergic to feraheme [ferumoxytol]. Family History: Patient family history includes Cancer in her father, maternal grandmother, maternal uncle, and paternal grandfather; Colon cancer in her father; Coronary  artery disease in her father and mother; Diabetes in her father; Heart disease in her father and mother; Lung cancer in her paternal grandmother; Prostate cancer in her paternal grandfather. Social History:  Patient  reports that she has never smoked. She has never used smokeless tobacco. She reports that she does not drink alcohol or use drugs.  Review of Systems: Constitutional: Negative for fever malaise or anorexia Cardiovascular: negative for chest pain Respiratory: negative for SOB or persistent cough Gastrointestinal: negative for abdominal pain  OBJECTIVE Vitals: LMP 09/18/2009 (Approximate)  General: no acute distress , A&Ox3  Leamon Arnt, MD

## 2019-11-02 NOTE — Telephone Encounter (Signed)
Refilled by Dr. Jonni Sanger 10/27/19

## 2019-11-23 ENCOUNTER — Telehealth: Payer: Self-pay | Admitting: *Deleted

## 2019-11-23 NOTE — Telephone Encounter (Signed)
Patient updated pharmacy. Needs new Rx, unable to transfer prescriptions. Is out of Pristique 100mg  tab.   Next AEX 12/15/19 Last AEX 09/12/18 Last MMG 10/18/2019; normal  Rx pended:   WellbutrinXL 300 mg #90/0RF  Pristiq 100 mg tab #90/0RF  Vivelle Dot 0.1 mg patch #8/0RF   Routing to Dr. Sabra Heck to review Rx request.

## 2019-11-24 MED ORDER — BUPROPION HCL ER (XL) 300 MG PO TB24: 300.0000 mg | ORAL_TABLET | Freq: Every day | ORAL | 0 refills | Status: DC

## 2019-11-24 MED ORDER — ESTRADIOL 0.1 MG/24HR TD PTTW: 1.0000 | MEDICATED_PATCH | TRANSDERMAL | 0 refills | Status: DC

## 2019-11-24 MED ORDER — DESVENLAFAXINE SUCCINATE ER 100 MG PO TB24: 100.0000 mg | ORAL_TABLET | Freq: Every day | ORAL | 0 refills | Status: DC

## 2019-11-24 NOTE — Telephone Encounter (Signed)
Rx completed.  Ok to close encounter.   °

## 2019-12-13 IMAGING — MR MRI HEAD WITHOUT AND WITH CONTRAST
11 of 15 series · 28 of 48 positions shown · IV contrast (gadavist)
Comparison: None.

CLINICAL DATA: Chronic headaches. Memory issues. History of
endometrial cancer.

EXAM:
MRI HEAD WITHOUT AND WITH CONTRAST
TECHNIQUE: Multiplanar, multiecho pulse sequences of the brain and surrounding
structures were obtained without and with intravenous contrast.
CONTRAST:  10 mL Gadavist

[Series 3: T1 · sagittal · 5.0mm · 0.47mm/px · 1 of 20 slices shown]
[im 1/20]
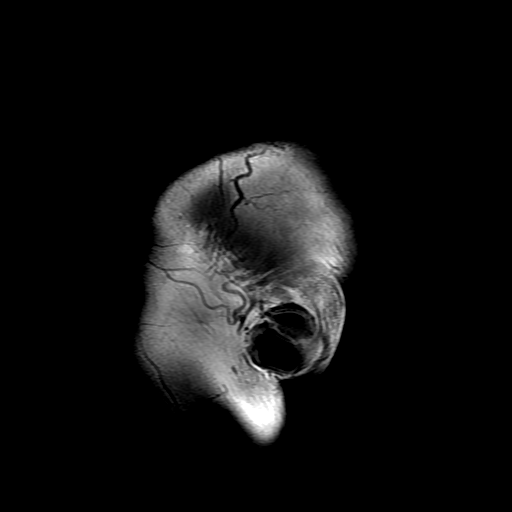

[Series 4: DWI · axial · 3.0mm · 1.09mm/px · z∈[-61,+83]mm · 5 of 98 slices shown (1 of 4)]
[im 1/98]
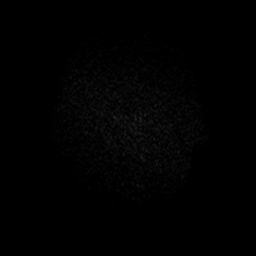
[im 25/98]
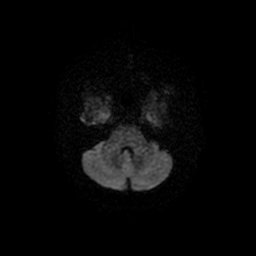
[im 49/98]
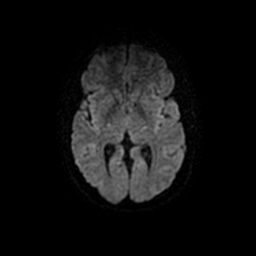
[im 73/98]
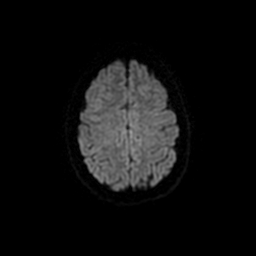
[im 98/98]
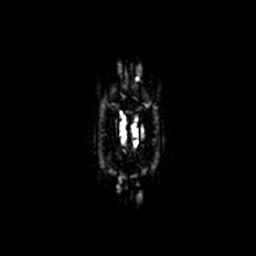

[Series 5: T2 · axial · 5.0mm · 0.43mm/px · 1 of 22 slices shown]
[im 1/22]
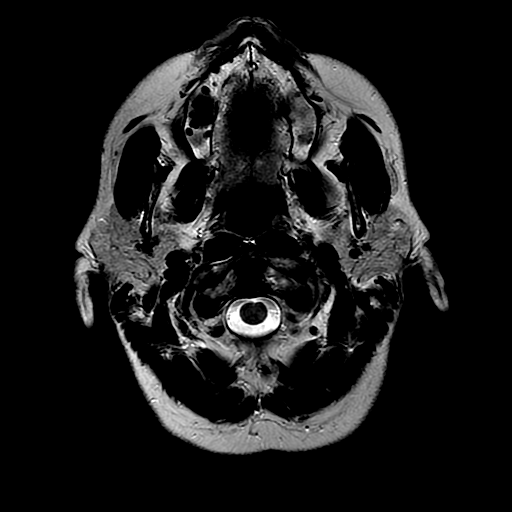

[Series 6: FLAIR · axial · 3.0mm · 0.43mm/px · 1 of 26 slices shown (1 of 2)]
[im 1/26]
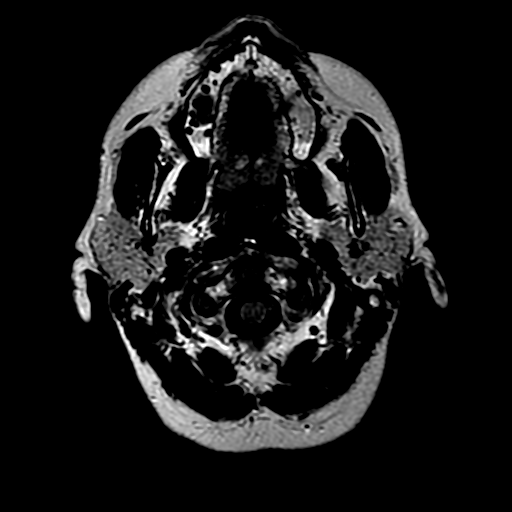

[Series 9: DWI · coronal · 4.0mm · 1.09mm/px · 4 of 86 slices shown (2 of 4)]
[im 1/86]
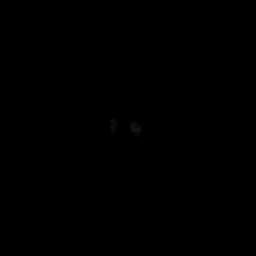
[im 29/86]
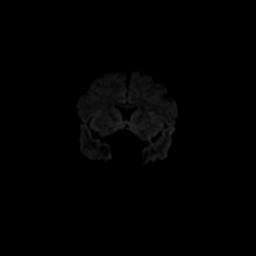
[im 57/86]
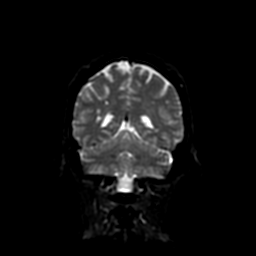
[im 86/86]
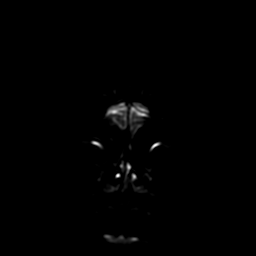

[Series 10: FLAIR · sagittal · 1.8mm · 0.47mm/px · 9 of 184 slices shown (2 of 2)]
[im 1/184]
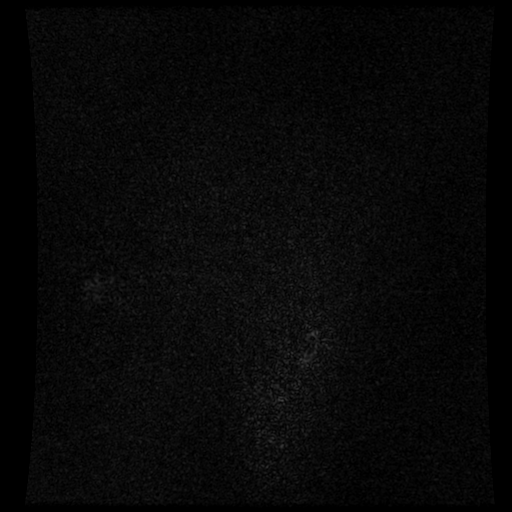
[im 23/184]
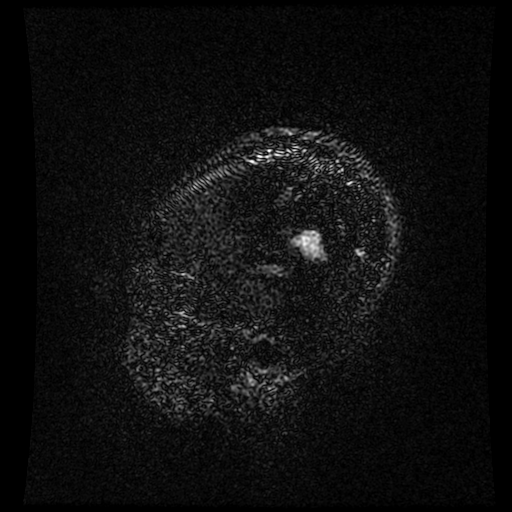
[im 46/184]
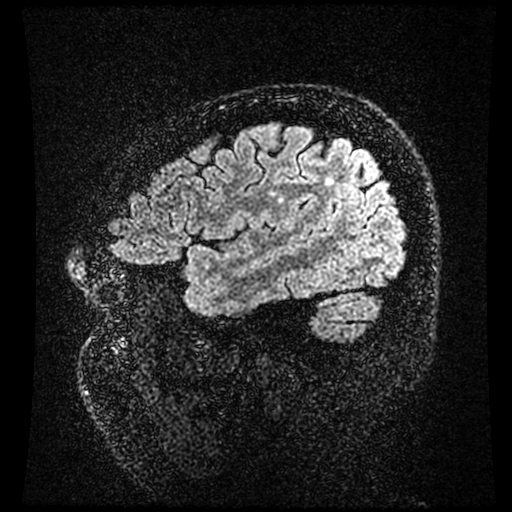
[im 69/184]
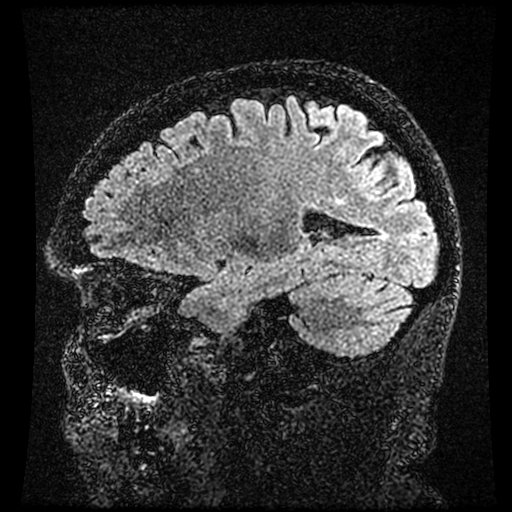
[im 92/184]
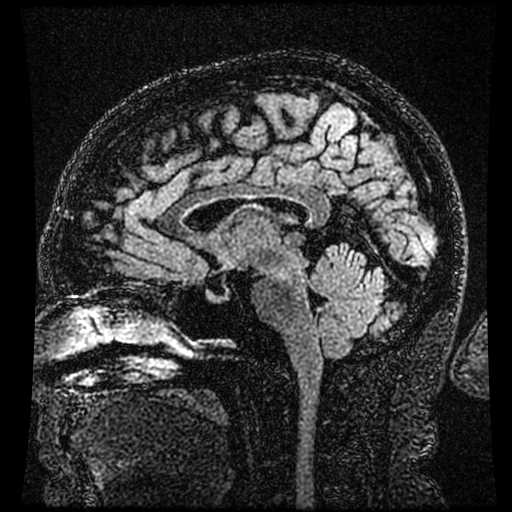
[im 115/184]
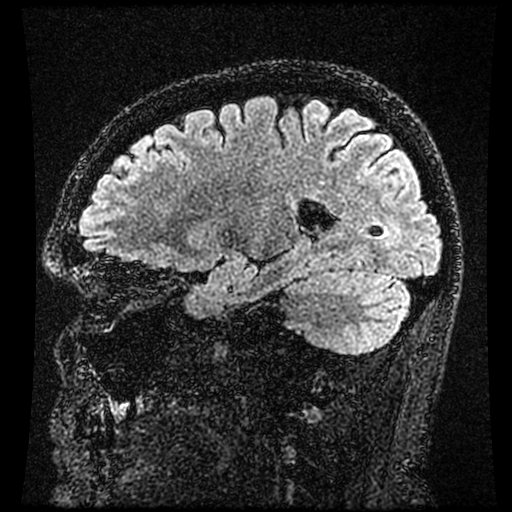
[im 138/184]
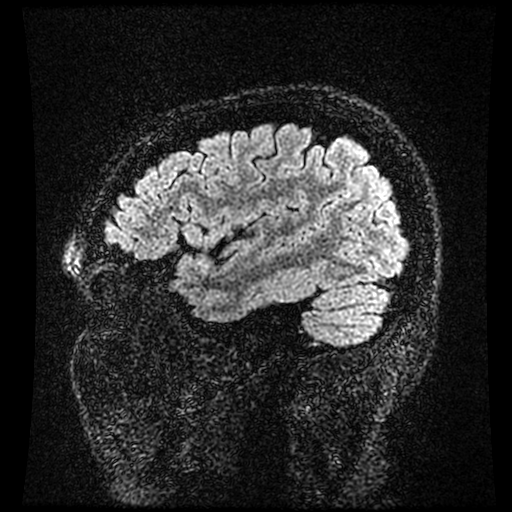
[im 161/184]
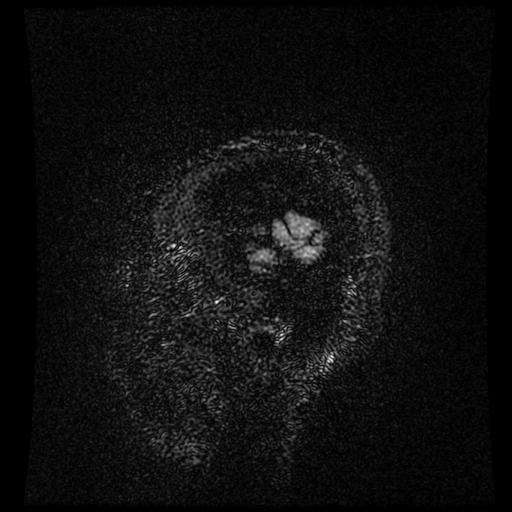
[im 184/184]
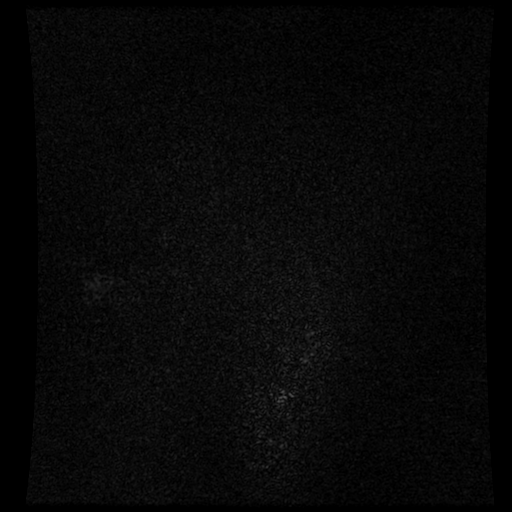

[Series 11: T2 post-contrast · coronal · 5.0mm · 0.45mm/px · 1 of 27 slices shown]
[im 1/27]
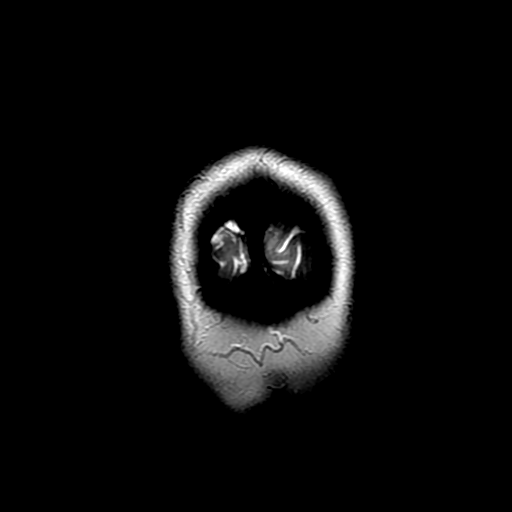

[Series 13: T1 post-contrast · coronal · 5.0mm · 0.43mm/px · 1 of 27 slices shown (1 of 2)]
[im 1/27]
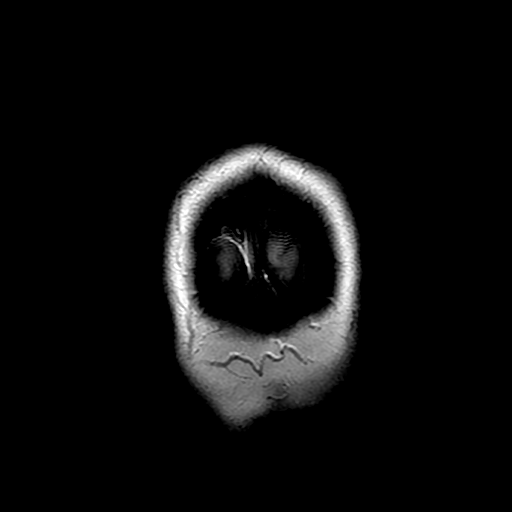

[Series 14: T1 post-contrast · sagittal · 5.0mm · 0.47mm/px · 1 of 20 slices shown (2 of 2)]
[im 1/20]
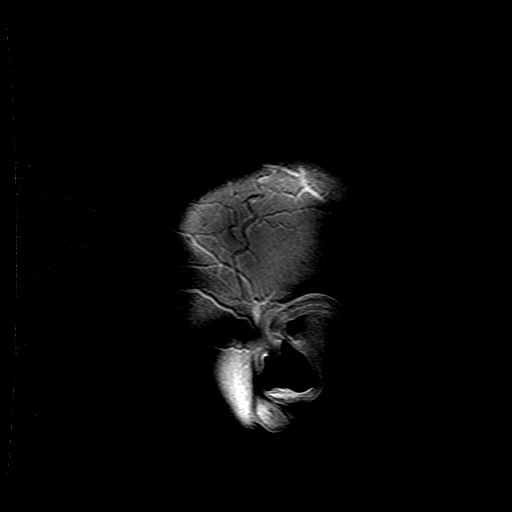

[Series 400: DWI · axial · 3.0mm · 1.09mm/px · z∈[-61,+83]mm · 2 of 49 slices shown (3 of 4)]
[im 1/49]
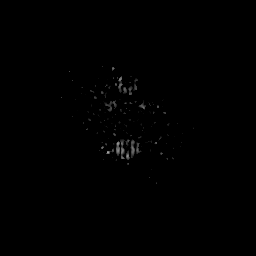
[im 49/49]
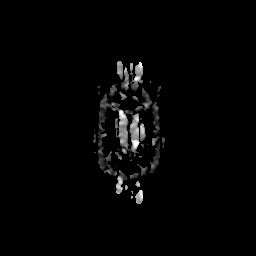

[Series 900: DWI · coronal · 4.0mm · 1.09mm/px · 2 of 43 slices shown (4 of 4)]
[im 1/43]
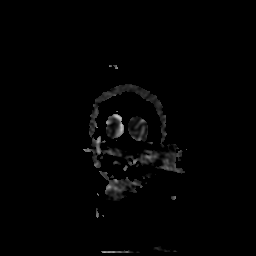
[im 43/43]
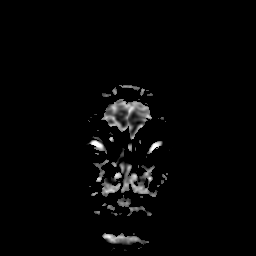

[28 of 48 positions shown; findings below may reference images not displayed]

FINDINGS: Brain: There is no evidence of acute infarct, intracranial
hemorrhage, mass, midline shift, or extra-axial fluid collection.
The ventricles and sulci are normal. Small foci of T2 hyperintensity
are scattered throughout the juxtacortical, deep, and
periventricular white matter bilaterally, mildly advanced for age.
There is no involvement of the corpus callosum or brainstem. There
may be a punctate chronic infarct in the right cerebellar
hemisphere. No abnormal enhancement is identified. A non expanded
partially empty sella is noted.

Vascular: Major intracranial vascular flow voids are preserved.

Skull and upper cervical spine: Unremarkable bone marrow signal.

Sinuses/Orbits: Dilated optic nerve sheaths, left greater than
right. Small volume fluid in the left maxillary sinus. Clear mastoid
air cells.

Other: None.
IMPRESSION: 1. No acute intracranial abnormality or mass.
2. Mildly age advanced cerebral white matter T2 signal changes,
nonspecific. Considerations include chronic small vessel ischemic
disease, migraines, vasculitis, hypercoagulable state, demyelinating
disease, and prior infection/inflammation.
3. Partially empty sella and dilated optic nerve sheaths which can
be seen with idiopathic intracranial hypertension. Consider lumbar
puncture to assess opening pressure.

## 2019-12-15 ENCOUNTER — Ambulatory Visit: Payer: 59 | Admitting: Obstetrics & Gynecology

## 2020-01-12 ENCOUNTER — Encounter: Payer: 59 | Admitting: Family Medicine

## 2020-01-12 MED FILL — ESZOPICLONE 3 MG TABS: 3 | 25 days supply | Qty: 20 | Fill #1

## 2020-01-15 ENCOUNTER — Telehealth: Payer: Self-pay | Admitting: *Deleted

## 2020-01-15 MED FILL — ALPRAZolam 0.5 MG TABS: 0.5 | 30 days supply | Qty: 30 | Fill #1

## 2020-01-15 NOTE — Telephone Encounter (Signed)
PA request received from [place] for Eszopiclone 3mg  PO tabs.   PA submitted to plan via [email or web address]  Approved today Your PA request has been approved. Additional information will be provided in the approval communication. (Message [phone])   Patient notified.

## 2020-01-18 ENCOUNTER — Other Ambulatory Visit: Payer: Self-pay | Admitting: Obstetrics and Gynecology

## 2020-01-18 ENCOUNTER — Telehealth: Payer: Self-pay | Admitting: Neurology

## 2020-01-18 MED ORDER — TOPIRAMATE 50 MG PO TABS: ORAL_TABLET | ORAL | 0 refills | Status: DC

## 2020-01-18 MED ORDER — PROMETHAZINE HCL 12.5 MG PO TABS: 12.5000 mg | ORAL_TABLET | ORAL | 0 refills | Status: DC | PRN

## 2020-01-18 MED FILL — PROMETHAZINE 12.5 MG TABLET: 12.5 | 5 days supply | Qty: 30 | Fill #0

## 2020-01-18 MED FILL — TOPIRAMATE 50 MG TABLET: 50 | 7 days supply | Qty: 21 | Fill #0

## 2020-01-18 NOTE — Telephone Encounter (Signed)
Patient states she is out of topamax and her insurance has changed is now saying it has to have a prior auth done. She needs to know if we can get that done or should she be switched to a different medication  Please call

## 2020-01-18 NOTE — Telephone Encounter (Signed)
Can wait until Dr. Tomi Likens returns

## 2020-01-18 NOTE — Progress Notes (Signed)
Patient can't get her extended release topamax, can't reach Neurology. Will call topamax 50 mg TID for one week while she tries to get the ER tablets.  Will refill Phenergan for her headaches.

## 2020-01-18 NOTE — Telephone Encounter (Signed)
Patient is out of Topamax completely,unable to get prior done, can you change medication and send in something else to [facility] outpatient. Thanks per patient.

## 2020-01-22 ENCOUNTER — Encounter: Payer: Self-pay | Admitting: *Deleted

## 2020-01-22 NOTE — Telephone Encounter (Signed)
We will submit for a prior-auth.  In the meantime, she can come by the office to pick up samples.  We do not have samples of Qudexy, however we have samples of Trokendi, which is an alternative brand of extended topamax.  They are 50mg  tablets.  She should take 3 daily.  I will give her enough for a week until we get the prior auth approved.

## 2020-01-22 NOTE — Progress Notes (Signed)
Judith Holland (Key: [id number]) - MRN: [id number] Need help? Call us at [phone] Status Sent to [employer] Next Steps The plan will fax you a determination, typically within 1 to 5 business days.  How do I follow up? Drug Topiramate ER 150MG  er sprinkle capsules Form [employer] Medication Request Form [employer]act Medication Request Form [phone]  Next Steps To follow up, call the plan at [phone]

## 2020-01-22 NOTE — Telephone Encounter (Signed)
Telephone cal to [facility] long Outpatient pharmacy to resubmit PA.

## 2020-01-29 ENCOUNTER — Telehealth: Payer: BC Managed Care – PPO | Admitting: Family Medicine

## 2020-01-31 NOTE — Progress Notes (Signed)
Pt had 3 box of 50mg  trokendi xr waiting to be picked up never came by to get they have been returned to floor stock,

## 2020-02-05 ENCOUNTER — Telehealth: Payer: Self-pay | Admitting: Neurology

## 2020-02-05 ENCOUNTER — Other Ambulatory Visit: Payer: Self-pay

## 2020-02-05 MED ORDER — TOPIRAMATE 25 MG PO TABS: 75.0000 mg | ORAL_TABLET | Freq: Two times a day (BID) | ORAL | 1 refills | Status: DC

## 2020-02-05 MED FILL — TOPIRAMATE 25 MG TABLET: 25 | 30 days supply | Qty: 180 | Fill #0

## 2020-02-05 NOTE — Telephone Encounter (Signed)
Spoke to pt will change to topiramate 75mg  twice daily.  It is not typically prescribed three times daily. script sent to pharmacy. Pt verbalized understanding

## 2020-02-05 NOTE — Telephone Encounter (Signed)
Patient called to request a prescription for topomax 50mg  3x/day. Her insurance changed and she is unable to fill qudexy. Please call.

## 2020-02-05 NOTE — Telephone Encounter (Signed)
We can change her prescription to topiramate but she can take it 150mg  at bedtime.  Or 75mg  twice daily.  It is not typically prescribed three times daily.

## 2020-02-07 MED FILL — ESZOPICLONE 3 MG TABS: 3 | 70 days supply | Qty: 70 | Fill #2

## 2020-02-07 MED FILL — tiZANidine HCL 2 MG TABS: 2 | 30 days supply | Qty: 60 | Fill #2

## 2020-02-09 ENCOUNTER — Other Ambulatory Visit: Payer: Self-pay | Admitting: Neurology

## 2020-02-15 ENCOUNTER — Other Ambulatory Visit: Payer: BC Managed Care – PPO

## 2020-02-21 ENCOUNTER — Encounter: Payer: Self-pay | Admitting: Family Medicine

## 2020-02-21 ENCOUNTER — Encounter: Payer: Self-pay | Admitting: *Deleted

## 2020-02-21 NOTE — Progress Notes (Signed)
[facility] KeyPO:[id number] - Rx #[id number] Need help? Call us at [phone] Status Sent to [employer] Drug Topiramate ER 150MG  er sprinkle capsules Form [employer] Electronic PA Form [id number] NCPDP) Original Claim Info 61 Prior Authorization Required  [name] Key: [id number] - PA Case ID: [id number] - Rx #: [id number] Need help? Call us at [phone] Status Sent to [employer] Drug Topiramate ER 150MG  er sprinkle capsules Form [employer] Electronic PA Form (2017 NCPDP) Original Claim Info 75 Prior Authorization Required

## 2020-02-22 ENCOUNTER — Other Ambulatory Visit: Payer: Self-pay

## 2020-02-22 ENCOUNTER — Other Ambulatory Visit: Payer: Self-pay | Admitting: Obstetrics & Gynecology

## 2020-02-22 ENCOUNTER — Telehealth: Payer: Self-pay | Admitting: *Deleted

## 2020-02-22 ENCOUNTER — Other Ambulatory Visit: Payer: Self-pay | Admitting: Neurology

## 2020-02-22 DIAGNOSIS — G4489 Other headache syndrome: Secondary | ICD-10-CM

## 2020-02-22 MED ORDER — PROPRANOLOL HCL 20 MG PO TABS: 20.0000 mg | ORAL_TABLET | Freq: Three times a day (TID) | ORAL | 1 refills | Status: DC

## 2020-02-22 NOTE — Telephone Encounter (Signed)
Called to let her know the prescription for Topiramate Extended release capsules was denied by her insurance. She said that the Topiramate she is taking at first did not seem to help but now that it is in her system it seems to be working. She said she will discuss this with Dr. Tomi Likens at her upcoming appt June 1st. I asked if she had enough Topiramate to hold her over until her appointment and she said no. I told her I will send a message to Dr. Tomi Likens to see if a refill can be sent in.

## 2020-02-22 NOTE — Progress Notes (Signed)
[facility] letter Notice of Adverse Determination Date: 02/21/2020 Judith Holland [address] [place], [place] [address] Plan Member Name: Judith Holland Plan Member ID: [id number] Plan Name: [employer] - No [place] Criteria Prescriber Name: Judith Holland Prescriber Phone: [phone] Prescriber Fax: [phone] Dear Judith Holland: [facility] received a request for coverage of Topiramate Ext-Rel Capsule for you. This is the initial adverse determination for this request. The request was denied because: Your plan approved Topiramate Ext-Rel Capsule (FA-PA) criteria does not cover this drug when your prescriber has indicated that you can be switched to a formulary drug. Your drug benefit plan provides coverage for other drugs which may be considered for your treatment. Your prescriber should provide you with a new prescription for the formulary product. This is based on the information that was provided. Preferred formulary products are: carbamazepine, carbamazepine ext-rel, clobazam, divalproex sodium, divalproex sodium ext-rel, gabapentin, lamotrigine, lamotrigine ext-rel, levetiracetam, levetiracetam ext-rel, oxcarbazepine, phenobarbital, phenytoin, phenytoin sodium extended, primidone, rufinamide, tiagabine, topiramate, valproic acid, zonisamide, Fycompa, Oxtellar XR, Trokendi XR, Vimpat, Xcopri. You may ask for a free copy of the actual benefit provision, guideline, protocol or other similar criterion used to make the decision and any other information related to this decision by calling Customer Care toll-free at the number on your benefit ID card. You may also choose to purchase this medicine at your own expense. For more information regarding your prescription benefit, please refer to your benefit plan materials. If you disagree with this decision, you may ask for an appeal. Please mail or fax your appeal to: Prescription Claim Appeals [place]  document contains references to brand-name prescription drugs that are trademarks or registered trademarks of pharmaceutical manufacturers not affiliated with [facility]. Your privacy is important to [place]. Our employees are trained regarding the appropriate way to handle your private health information. [id number] TDD/TTY: [phone] [address] [place] [address] Fax: [phone] If your situation is urgent as defined by law, you may ask for an expedited appeal. Urgent requests must be clearly identified as "urgent" when submitted. Important information about your appeal rights and directions about how to ask for an appeal are provided with this letter. If your prescriber would like to discuss this decision with a clinical reviewer at [facility], your prescriber can call [facility], and we will arrange to make someone available to speak with your prescriber. If you belong to a group plan that is subject to the [place] (ERISA), you may also have the right to bring a civil action under ERISA Section 502(a). If your plan has informed us of its time limit for bringing a civil action under ERISA, the time limit is listed below. If nothing is listed, please refer to your benefit plan materials or contact your plan administrator for more information on time limits for bringing a civil action. If you have questions, please call Customer Care toll-free at the number on your benefit ID card or in your benefit plan materials. Sincerely, [facility] Enclosures cc: Dr. Quita Skye JAFFE PA# [employer] - No Tiering - Std DAW Criteria [id number] JJ Plan-approved Criteria: Topiramate Ext-Rel Capsule (FA-PA) Claim Amount (if available): Service Date: 02/21/2020 1:08:28 PM If your prescriber included diagnosis or treatment codes with your claim for Topiramate Ext-Rel Capsule to [facility], that information is listed here: ICD diagnosis code:  [id number] Associated diagnosis: Migraine w/o aura, not intractable, w/o status migrainosus CPT treatment code: Associated  treatment: You may wish to contact your prescriber for more information about these codes

## 2020-02-22 NOTE — Progress Notes (Signed)
Archive Reason: Did not try and fail formulary alternative Judith Holland (Key: [id number]) - MRN: [id number] Need help? Call us at [phone] Archivedon May 5 Outcome Deniedon May 5 Did not try and fail formulary alternative Drug Topiramate ER 150MG  er sprinkle capsules Form [employer] Medication Request Form [employer] Medication Request Form [phone] [phone]

## 2020-02-22 NOTE — Telephone Encounter (Signed)
OK to refill her current topiramate.

## 2020-02-23 NOTE — Telephone Encounter (Signed)
Medication refill request: pristiq 100mg  Last AEX:  09-12-18 Next AEX: 03-21-2020 Last MMG (if hormonal medication request): n/a Refill authorized: please approve if appropriate

## 2020-02-26 ENCOUNTER — Other Ambulatory Visit: Payer: Self-pay | Admitting: Neurology

## 2020-02-26 ENCOUNTER — Other Ambulatory Visit: Payer: Self-pay

## 2020-02-26 MED ORDER — TOPIRAMATE 25 MG PO TABS: 75.0000 mg | ORAL_TABLET | Freq: Two times a day (BID) | ORAL | 1 refills | Status: DC

## 2020-02-26 NOTE — Telephone Encounter (Signed)
Refill added

## 2020-02-27 ENCOUNTER — Other Ambulatory Visit: Payer: Self-pay

## 2020-02-27 DIAGNOSIS — G4489 Other headache syndrome: Secondary | ICD-10-CM

## 2020-02-27 MED ORDER — PROPRANOLOL HCL 20 MG PO TABS: 20.0000 mg | ORAL_TABLET | Freq: Three times a day (TID) | ORAL | 1 refills | Status: DC

## 2020-03-04 ENCOUNTER — Other Ambulatory Visit: Payer: Self-pay

## 2020-03-04 ENCOUNTER — Ambulatory Visit (HOSPITAL_COMMUNITY)
Admission: EM | Admit: 2020-03-04 | Discharge: 2020-03-04 | Disposition: A | Discharge status: Home / Self Care | Payer: BC Managed Care – PPO | Attending: Internal Medicine | Admitting: Internal Medicine

## 2020-03-04 ENCOUNTER — Encounter (HOSPITAL_COMMUNITY): Payer: Self-pay

## 2020-03-04 ENCOUNTER — Ambulatory Visit (INDEPENDENT_AMBULATORY_CARE_PROVIDER_SITE_OTHER): Payer: BC Managed Care – PPO

## 2020-03-04 DIAGNOSIS — S90921A Unspecified superficial injury of right foot, initial encounter: Secondary | ICD-10-CM | POA: Diagnosis not present

## 2020-03-04 DIAGNOSIS — S93601A Unspecified sprain of right foot, initial encounter: Secondary | ICD-10-CM

## 2020-03-04 NOTE — ED Triage Notes (Signed)
Pt c/o right ankle pain/injury 2/2 falling down two brick stairs last night. Pt denies head injury/LOC. Also c/o pain to right hand/wrist.  Right ankle edematous at lateral aspect with ecchymosis and limited ROM. No edema to right wrist/hand; no point tenderness to right hand/wrist.

## 2020-03-04 NOTE — Discharge Instructions (Addendum)
Okay to take ibuprofen 800 mg every 6-8 hours as needed for pain.  Wear boot for comfort.  When able please elevate and ice right foot.  Follow-up with [facility] as needed.

## 2020-03-04 NOTE — ED Provider Notes (Signed)
[place]    CSN: [id number] Arrival date & time: 03/04/20  3151      History   Chief Complaint Chief Complaint  Patient presents with  . Ankle Pain    HPI Judith Holland is a 54 y.o. female with past medical history of migraine headaches and obesity presenting to urgent care today after fall down garage steps.  Patient states she was hurrying out of the house and simply missed the second step causing her to fall and injure her right foot.  Patient also reports some mild discomfort to right thumb with small abrasion.  She denies any loss of consciousness or dizziness prior to fall.  Patient iced ankle last evening and took ibuprofen with some relief.  Some soreness to right hand today but denies any pain.  Pain in ankle currently 4 out of 10.    Ankle Pain   Past Medical History:  Diagnosis Date  . Elevated eye pressure   . Endometrioid adenocarcinoma    Stage IA grade 1  . IDA (iron deficiency anemia) 09/22/2017   Requiring iron infusions  . Infertility, female    clomid and IUI x 3  . Menorrhagia   . Migraine headaches   . Morbid obesity ([place])   . Rapid heartbeat   . 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 She has maintained over 100 pound weight loss.    Patient Active Problem List   Diagnosis Date Noted  . Migraine headache without aura 10/30/2019  . 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 Status post gastric bypass for obesity 2011 10/30/2019  . Abnormal MRI of head 10/30/2019  . Major depression, recurrent, chronic ([place]) 10/30/2019  . Open angle with borderline findings, high risk, bilateral 06/04/2019  . Age-related nuclear cataract of both eyes 06/04/2019  . Primary insomnia 12/11/2018  . Sensorineural hearing loss (SNHL), bilateral 12/11/2018  . Leukopenia 12/11/2018  . Hypoglycemic reaction 12/11/2018  . Cervicogenic headache 12/11/2018  . Iron deficiency anemia  secondary to inadequate dietary iron intake 09/22/2017  . Malabsorption of iron 09/22/2017  . Stress incontinence 09/11/2015  . History of endometrial cancer 02/10/2012  . Allergic rhinitis 10/02/2007    Past Surgical History:  Procedure Laterality Date  . CESAREAN SECTION     x2  . DIAGNOSTIC LAPAROSCOPY     for infertility that was negative  . HERNIA REPAIR  3/12  . LAPAROSCOPIC CHOLECYSTECTOMY  1994  . LAPAROSCOPIC RADICAL TOTAL HYSTERECTOMY W/ NODE BIOPSY  12/10   with BSO  . NASAL SINUS SURGERY    . 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 TONSILLECTOMY     x 3    OB History    Gravida  3   Para  2   Term      Preterm      AB      Living  2     SAB      TAB      Ectopic      Multiple      Live Births               Home Medications    Prior to Admission medications   Medication Sig Start Date End Date Taking? Authorizing Provider  ALPRAZolam Duanne Moron) 0.5 MG tablet Take 1 tablet (0.5 mg total) by mouth daily as needed for anxiety. 10/30/19  Yes Leamon Arnt, MD  buPROPion [facility]  XL) 300 MG 24 hr tablet Take 1 tablet (300 mg total) by mouth daily. 11/24/19  Yes Megan Salon, MD  desvenlafaxine (PRISTIQ) 100 MG 24 hr tablet TAKE 1 TABLET BY MOUTH EVERY DAY 02/25/20  Yes Megan Salon, MD  estradiol (VIVELLE-DOT) 0.1 MG/24HR patch Place 1 patch (0.1 mg total) onto the skin 2 (two) times a week. 11/27/19  Yes Megan Salon, MD  Eszopiclone 3 MG TABS TAKE 1 TABLET BY MOUTH IMMEDIATELY BEFORE BEDTIME 10/10/19  Yes Megan Salon, MD  propranolol (INDERAL) 20 MG tablet Take 1 tablet (20 mg total) by mouth 3 (three) times daily. 02/27/20  Yes Leamon Arnt, MD  spironolactone (ALDACTONE) 100 MG tablet Take 1 tablet (100 mg total) by mouth 2 (two) times daily. 09/20/18  Yes Megan Salon, MD  topiramate (TOPAMAX) 25 MG tablet Take 3 tablets (75 mg total) by mouth 2 (two) times daily. 02/26/20  Yes Jaffe, Adam R, DO  amoxicillin-clavulanate (AUGMENTIN) 875-125  MG tablet Take 1 tablet by mouth 2 (two) times daily. 09/08/19   Megan Salon, MD  blood glucose meter kit and supplies KIT Dispense based on patient and insurance preference. Use up to four times daily as directed. (FOR ICD-9 250.00, 250.01). 12/09/18   Briscoe Deutscher, DO  Cyanocobalamin (B-12) 2500 MCG SUBL Place 2,500 mcg under the tongue daily. 09/22/17   [place], Holli Humbles, NP  ergocalciferol (VITAMIN D2) 1.25 MG (50000 UT) capsule Take 1 capsule (50,000 Units total) by mouth 2 (two) times a week. 09/22/18   Megan Salon, MD  glucose blood (FREESTYLE INSULINX TEST) test strip Use as instructed 06/29/19   Briscoe Deutscher, DO  Lancets (FREESTYLE) lancets Use as instructed 06/29/19   Briscoe Deutscher, DO  Multiple Vitamins-Minerals (MULTIVITAMIN PO) Take 1 tablet by mouth 2 (two) times a week.     [provider]  promethazine (PHENERGAN) 12.5 MG tablet Take 1 tablet (12.5 mg total) by mouth every 4 (four) hours as needed. 01/18/20   Salvadore Dom, MD  tiZANidine (ZANAFLEX) 2 MG tablet Take 1 tablet at bedtime and 1 tablet for headache if needed. 08/17/19   Pieter Partridge, DO  topiramate ER (QUDEXY XR) 150 MG CS24 sprinkle capsule Take 1 capsule (150 mg total) by mouth at bedtime. 08/17/19   Pieter Partridge, DO  topiramate ER (QUDEXY XR) 50 MG CS24 sprinkle capsule Take 1 capsule at bedtime for one week, then 2 capsules at bedtime for one week.  Then start the 169m capsules. 08/17/19   JPieter Partridge DO    Family History Family History  Problem Relation Age of Onset  . Coronary artery disease Mother   . Heart disease Mother   . Colon cancer Father   . Coronary artery disease Father   . Diabetes Father   . Cancer Father        lung  . Heart disease Father   . Prostate cancer Paternal Grandfather   . Cancer Paternal Grandfather        prostate  . Cancer Maternal Uncle        throat  . Lung cancer Paternal Grandmother   . Cancer Maternal Grandmother        lung    Social  History Social History   Tobacco Use  . Smoking status: Never Smoker  . Smokeless tobacco: Never Used  Substance Use Topics  . Alcohol use: No    Alcohol/week: 0.0 standard drinks  . Drug use:  No     Allergies   Feraheme [ferumoxytol]   Review of Systems As stated in HPI otherwise negative   Physical Exam Triage Vital Signs ED Triage Vitals  Enc Vitals Group     BP 03/04/20 0858 120/66     Pulse Rate 03/04/20 0858 88     Resp 03/04/20 0858 16     Temp 03/04/20 0858 98.1 F (36.7 C)     Temp Source 03/04/20 0858 Oral     SpO2 03/04/20 0858 99 %     Weight --      Height --      Head Circumference --      Peak Flow --      Pain Score 03/04/20 0855 4     Pain Loc --      Pain Edu? --      Excl. in [place]? --    No data found.  Updated Vital Signs BP 120/66 (BP Location: Left Arm)   Pulse 88   Temp 98.1 F (36.7 C) (Oral)   Resp 16   LMP 09/18/2009 (Approximate)   SpO2 99%   Physical Exam  Constitutional: She is oriented to person, place, and time and well-developed, well-nourished, and in no distress.  HENT:  Head: Normocephalic and atraumatic.  Cardiovascular: Intact distal pulses.  Musculoskeletal:        General: Tenderness and edema present. No deformity.     Comments: Significant bruising and swelling to lateral aspect of right foot with tenderness on palpation.  Right hand and thumb normal range of motion no deformity no bruising or swelling. Very small abrasion to dorsal aspect of thumb  Neurological: She is alert and oriented to person, place, and time.  Skin: Skin is warm and dry.  Psychiatric: Affect normal.       UC Treatments / Results  Labs (all labs ordered are listed, but only abnormal results are displayed) Labs Reviewed - No data to display   Radiology DG Foot Complete Right  Result Date: 03/04/2020 ARE NORMALLY SPACED CLINICAL DATA: Per pt: last night was leaving the house, missed the second step and fell down the rest of the  brick steps. Pain is the right foot, bruising and swelling is superior and lateral forefoot, pain is superiorly 2,3,4,5 metatarsals, lateral 5th metatarsal, lateral os calcis, and distal and anterior lateral malleolus and cuboid aspect swelling. No prior fracture to the right foot. No diabetes. EXAM: RIGHT FOOT COMPLETE - 3+ VIEW COMPARISON:  None. FINDINGS: No fracture or bone lesion. Joints are normally spaced and aligned. Small dorsal and moderate-sized plantar calcaneal spur. Mild soft tissue swelling most evident over the dorsal lateral aspect of the midfoot. IMPRESSION: No fracture or dislocation. Electronically Signed   By: Lajean Manes M.D.   On: 03/04/2020 09:50     Medications Ordered in UC Medications - No data to display  Initial Impression / Assessment and Plan / UC Course  I have reviewed the triage vital signs and the nursing notes.  Pertinent labs & imaging results that were available during my care of the patient were reviewed by me and considered in my medical decision making (see chart for details).  Right foot sprain -X-ray is negative for fracture -Cam walker for comfort -Frequent ice and elevation -Motrin as needed for pain -Patient instructed to follow-up with sports medicine as needed  Final Clinical Impressions(s) / UC Diagnoses   Final diagnoses:  Sprain of right foot, initial encounter     Discharge  Instructions     Okay to take ibuprofen 800 mg every 6-8 hours as needed for pain.  Wear boot for comfort.  When able please elevate and ice right foot.  Follow-up with [facility] sports medicine as needed.     ED Prescriptions    None     PDMP not reviewed this encounter.   Rudolpho Sevin, NP 03/04/20 1130

## 2020-03-08 MED FILL — ALPRAZolam 0.5 MG TABS: 0.5 | 30 days supply | Qty: 30 | Fill #2

## 2020-03-09 ENCOUNTER — Ambulatory Visit
Admission: RE | Admit: 2020-03-09 | Discharge: 2020-03-09 | Disposition: A | Discharge status: Home / Self Care | Payer: BC Managed Care – PPO | Source: Ambulatory Visit | Attending: Neurology | Admitting: Neurology

## 2020-03-09 DIAGNOSIS — R9082 White matter disease, unspecified: Secondary | ICD-10-CM

## 2020-03-09 MED ORDER — GADOBENATE DIMEGLUMINE 529 MG/ML IV SOLN
20.0000 mL | Freq: Once | INTRAVENOUS | Status: AC | PRN
  Administered 2020-03-09: 20 mL via INTRAVENOUS

## 2020-03-13 MED FILL — tiZANidine HCL 2 MG TABS: 2 | 30 days supply | Qty: 60 | Fill #3

## 2020-03-15 NOTE — Progress Notes (Signed)
54 y.o. G3P2 Married White or Caucasian female here for annual exam.  Had recent right ankle injury.  Denies vaginal bleeding.    Had brain MRI 03/08/2020.  Has stable white matter changes.  Probable 30m enhancement at the right IAc noted.  Sinusitis noted.    Patient's last menstrual period was 09/18/2009 (approximate).          Sexually active: Yes.    The current method of family planning is status post hysterectomy.    Exercising: No.  exercise Smoker:  no  Health Maintenance: Pap:  08-26-17 neg, 09-12-18 neg HPV HR neg History of abnormal Pap:  yes MMG:  10-18-2019 category b density birads 1:neg Colonoscopy:  03-18-18 normal f/u 560yrBMD:   2011 TDaP:  2011 Pneumonia vaccine(s):  Not done Shingrix:   Not done Hep C testing: neg 2018 Screening Labs: done with Dr. AnJonni Sanger reports that she has never smoked. She has never used smokeless tobacco. She reports that she does not drink alcohol or use drugs.  Past Medical History:  Diagnosis Date  . [place]  . Elevated eye pressure   . Endometrioid adenocarcinoma    Stage IA grade 1  . Hearing aid worn    both ears  . IDA (iron deficiency anemia) 09/22/2017   Requiring iron infusions  . Infertility, female    clomid and IUI x 3  . Menorrhagia   . Migraine headaches   . Morbid obesity ([place]  . Rapid heartbeat   . 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 She has maintained over 100 pound weight loss.    Past Surgical History:  Procedure Laterality Date  . CESAREAN SECTION     x2  . DIAGNOSTIC LAPAROSCOPY     for infertility that was negative  . HERNIA REPAIR  3/12  . LAPAROSCOPIC CHOLECYSTECTOMY  1994  . LAPAROSCOPIC RADICAL TOTAL HYSTERECTOMY W/ NODE BIOPSY  12/10   with BSO  . NASAL SINUS SURGERY    . 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 TONSILLECTOMY     x 3    Current Outpatient Medications  Medication Sig Dispense Refill  .  ALPRAZolam (XANAX) 0.5 MG tablet Take 1 tablet (0.5 mg total) by mouth daily as needed for anxiety. 30 tablet 3  . blood glucose meter kit and supplies KIT Dispense based on patient and insurance preference. Use up to four times daily as directed. (FOR ICD-9 250.00, 250.01). 1 each 0  . buPROPion (WELLBUTRIN XL) 300 MG 24 hr tablet Take 1 tablet (300 mg total) by mouth daily. 90 tablet 0  . desvenlafaxine (PRISTIQ) 100 MG 24 hr tablet TAKE 1 TABLET BY MOUTH EVERY DAY 90 tablet 0  . estradiol (VIVELLE-DOT) 0.1 MG/24HR patch Place 1 patch (0.1 mg total) onto the skin 2 (two) times a week. 24 patch 0  . Eszopiclone 3 MG TABS TAKE 1 TABLET BY MOUTH IMMEDIATELY BEFORE BEDTIME 90 tablet 1  . glucose blood (FREESTYLE INSULINX TEST) test strip Use as instructed 100 each 12  . Lancets (FREESTYLE) lancets Use as instructed 100 each 12  . Multiple Vitamins-Minerals (MULTIVITAMIN PO) Take 1 tablet by mouth 2 (two) times a week.     . promethazine (PHENERGAN) 12.5 MG tablet Take 1 tablet (12.5 mg total) by mouth every 4 (four) hours as needed. 30 tablet 0  . propranolol (INDERAL) 20 MG tablet Take 1 tablet (  20 mg total) by mouth 3 (three) times daily. 270 tablet 1  . spironolactone (ALDACTONE) 100 MG tablet Take 1 tablet (100 mg total) by mouth 2 (two) times daily. 180 tablet 4  . tiZANidine (ZANAFLEX) 2 MG tablet Take 1 tablet at bedtime and 1 tablet for headache if needed. 60 tablet 6  . topiramate (TOPAMAX) 25 MG tablet Take 3 tablets (75 mg total) by mouth 2 (two) times daily. 270 tablet 1   No current facility-administered medications for this visit.    Family History  Problem Relation Age of Onset  . Coronary artery disease Mother   . Heart disease Mother   . Colon cancer Father   . Coronary artery disease Father   . Diabetes Father   . Cancer Father        lung  . Heart disease Father   . Prostate cancer Paternal Grandfather   . Cancer Paternal Grandfather        prostate  . Cancer Maternal  Uncle        throat  . Lung cancer Paternal Grandmother   . Cancer Maternal Grandmother        lung    Review of Systems  Constitutional: Negative.   HENT: Negative.   Eyes: Negative.   Respiratory: Negative.   Cardiovascular: Negative.   Gastrointestinal: Negative.   Endocrine: Negative.   Genitourinary: Negative.   Musculoskeletal: Negative.   Skin: Negative.   Allergic/Immunologic: Negative.   Neurological: Negative.   Hematological: Negative.   Psychiatric/Behavioral: Negative.     Exam:   BP 110/80   Pulse 72   Temp (!) 97 F (36.1 C) (Skin)   Resp 16   Ht 5' 6.75" (1.695 m)   Wt 263 lb (119.3 kg)   LMP 09/18/2009 (Approximate)   BMI 41.50 kg/m   Height: 5' 6.75" (169.5 cm)  General appearance: alert, cooperative and appears stated age Head: Normocephalic, without obvious abnormality, atraumatic Neck: no adenopathy, supple, symmetrical, trachea midline and thyroid normal to inspection and palpation Lungs: clear to auscultation bilaterally Breasts: normal appearance, no masses or tenderness Heart: regular rate and rhythm Abdomen: soft, non-tender; bowel sounds normal; no masses,  no organomegaly Extremities: extremities normal, atraumatic, no cyanosis or edema Skin: Skin color, texture, turgor normal. No rashes or lesions Lymph nodes: Cervical, supraclavicular, and axillary nodes normal. No abnormal inguinal nodes palpated Neurologic: Grossly normal   Pelvic: External genitalia:  no lesions              Urethra:  normal appearing urethra with no masses, tenderness or lesions              Bartholins and Skenes: normal                 Vagina: small erythematous lesion on the anterior vaginal wall, midline, above cuff suture line, does not bleed with touching              Cervix: absent              Pap taken: No. Bimanual Exam:  Uterus:  uterus absent              Adnexa: no mass, fullness, tenderness               Rectovaginal: Confirms                Anus:  normal sphincter tone, no lesions  Chaperone, Terence Lux, CMA, was present for exam.  A:  Well Woman with normal exam H/o gastric bypass Osteopenia H/o stage 1A, grade 1 endometrial adenocarcinoma, s/p TLH/BSO, pelvic LND 09/2009 Family hx colon cancer in father, last colonoscopy 5/19 Migraines, followed by Dr. Tomi Likens New erythematous vaginal lesion Sinusitis and polyp on recent brain MRI (symptomatic of sinusitis today)  P:   Mammogram guidelines reviewed pap smear not obtained.  Will have pt use vaginal estrogen cream pv twice weekly and recheck 4-6 weeks.  If not resolved, will plan pap and possible biopsy. Plan BMD with MMG.  Future order placed. RF Buproprion Xl 342m daily.  #90/4RF RF Spionolactone 1042mBID.  #180/4RF RF vivelle dot patch 0.2m23matches twice weekly.  #24/4RF RF for Prestique 100m23mily.  #90/4RF RF for Lunesta 3mg 55m prn insomnia.  #90/1RF HbA1C, CMP, CBC, lipids, TSH obtained today Augementin 875mg 24mx 7 days Tdap updated today Return annually or prn

## 2020-03-15 NOTE — Progress Notes (Deleted)
NEUROLOGY FOLLOW UP OFFICE NOTE  Judith Holland [id number]  HISTORY OF PRESENT ILLNESS: Judith Holland is a 54 year old woman who follows up for headaches and abnormal brain MRI.  UPDATE: Headaches: Emgality was stopped due to constipation.  As discontinuing Qudexy did not improve memory, it was restarted as it was helpful in controlling her migraines.   Intensity:mild Duration:Couple of hours Frequency:4 mild headaches a month (no severe migraines) Current NSAIDS:diclofenac Current analgesics:no Current triptans:rizatriptan 63m Current anti-emetic:promethazine Current muscle relaxants:tizanidine 24mat bedtime (neck pain) Current anti-anxiolytic:alprazolam Current sleep aide:eszopiclone Current Antihypertensive medications:propranolol 2043mID (palpitations, anxiety); spironolactone Current Antidepressant medications:Bupropion, Pristiq Current Anticonvulsant medications:Qudexy 150m75mily Current CGRP inhibitor:  Emgality Current Vitamins/Herbal/Supplements:MVI, D, B12 Current Antihistamines/Decongestants:Flonase Other therapy:no Hormone/birth control: Estradiol  Repeat MRI of brain with and without contrast on 03/10/2020 was personally reviewed and showed mild nonspecific chronic white matter changes, stable compared to 2020.  Probable 2 mm nodular enhancement at right IAC seen, suspicious for vestibular schwannoma.  She reports bilateral hearing loss, no facial droop.    Caffeine:tea Alcohol:no Smoker:no Diet:hydrates Exercise:no Depression/anxiety: grieving since passing of her mother in September. Sleep hygiene:Okay with Lunesta  HISTORY: Onset: She has had headaches on and off since young adulthood. They became more frequent in 2016. Around that time, she also began experiencing memory deficits as well as poor coordination and vertigo. 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 MRI of brain without contrast from 08/22/15 revealed nonspecific small foci of T2 hyperintensity in the cerebral white matter.She reportedly had a repeat MRIin November 2017, but a comparison was not made. MS was considered. As for the headaches: Location:Above the right eye Quality:sharp Initial intensity:7/10 Aura:no Prodrome:no Postdrome:no Associated symptoms: Photophobia. Sometimes nausea. No photophobia or visual disturbance. She has not had any new worse headache of her life, waking up from sleep InitialDuration:1 to 2 days InitialFrequency:Twice a month Triggers:If she forgets to take her estradiol,fatigue, onset of tension headache. Relieving factors: Turning lights down Activity:Cannot function.  She also has tension type headaches that begin at the back of her neck and radiate up to the front, non-throbbing, 3-4/10, lasting up to 3 days and occurring twice a month.  She also endorses pressure in back of the neck into the shoulders but repetitive movements of neck and arms may aggravate a mild diffuse headache lasting all day and daily. She can wake up with this headache. She is not treating these headaches with analgesics. She denies blurred vision or visual obscurations. She [facility]mology [facility] which did not demonstrate papilledema or other optic nerve involvement. She notes intermittent "heart beat" in her ears.  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 No abnormal enhancement. Left greater than right dilated optic nerve sheaths noted,  as well as partial empty sella, raising suspicion of idiopathic intracranial hypertension. She had a lumbar puncture on 03/21/19 which demonstrated a normal opening pressure of 8 cm H2O and closing pressure of 12 cm H2O. CSF analysis showed cell count 1, protein 35, glucose 56, negative culture and no oligoclonal bands.  Past NSAIDS:Cannot take most NSAIDs due to gastric bypass surgery Past analgesics:no  Past abortive triptans:Sumatriptan tablet, Treximet (side effects) Past muscle relaxants:no Past anti-emetic:no Past antihypertensive medications:no Past antidepressant medications:no Past anticonvulsant medications:topiramate 166m Past CGRP inhibitor:  Emgality (constipation) Past vitamins/Herbal/Supplements:no Other past therapies:no  Family history of headache:mom  Shehas reportedmemory deficits that precede initiation of topiramate. She has word-finding difficulty. Sometimes she cannot recall the name of people she knows. She will sometimes briefly get disoriented while driving.  Labs from February and March 2020 showed TSH 1.12, B12 409, B1 11 and B2 6.5. She does have known iron deficiency anemia.   She underwent neuropsychological evaluation on 07/18/2019.  Testing demonstrated relative weaknesses across a task assessing spatial manipulation, as well as a task assessing concept formation and problem solving.  However these scores remain within normal limits.   PAST MEDICAL HISTORY: Past Medical History:  Diagnosis Date  . Elevated eye pressure   . Endometrioid adenocarcinoma    Stage IA grade 1  . IDA (iron deficiency anemia) 09/22/2017   Requiring iron infusions  . Infertility, female    clomid and IUI x 3  . Menorrhagia   . Migraine headaches   . [place]   . Rapid heartbeat   . 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 She has maintained over 100 pound weight loss.     MEDICATIONS: Current Outpatient Medications on File Prior to Visit  Medication Sig Dispense Refill  . ALPRAZolam (XANAX) 0.5 MG tablet Take 1 tablet (0.5 mg total) by mouth daily as needed for anxiety. 30 tablet 3  . amoxicillin-clavulanate (AUGMENTIN) 875-125 MG tablet Take 1 tablet by mouth 2 (two) times daily. 14 tablet 0  . blood glucose meter kit and supplies KIT Dispense based on patient and insurance preference. Use up to four times daily as directed. (FOR ICD-9 250.00, 250.01). 1 each 0  . buPROPion (WELLBUTRIN XL) 300 MG 24 hr tablet Take 1 tablet (300 mg total) by mouth daily. 90 tablet 0  . Cyanocobalamin (B-12) 2500 MCG SUBL Place 2,500 mcg under the tongue daily. 30 tablet 11  . desvenlafaxine (PRISTIQ) 100 MG 24 hr tablet TAKE 1 TABLET BY MOUTH EVERY DAY 90 tablet 0  . ergocalciferol (VITAMIN D2) 1.25 MG (50000 UT) capsule Take 1 capsule (50,000 Units total) by mouth 2 (two) times a week. 16 capsule 6  . estradiol (VIVELLE-DOT) 0.1 MG/24HR patch Place 1 patch (0.1 mg total) onto the skin 2 (two) times a week. 24 patch 0  . Eszopiclone 3 MG TABS TAKE 1 TABLET BY MOUTH IMMEDIATELY BEFORE BEDTIME 90 tablet 1  . glucose blood (FREESTYLE INSULINX TEST) test strip Use as instructed 100 each 12  . Lancets (FREESTYLE) lancets Use as instructed 100 each 12  . Multiple Vitamins-Minerals (MULTIVITAMIN PO) Take 1 tablet by mouth 2 (two) times a week.     . promethazine (PHENERGAN) 12.5 MG tablet Take 1 tablet (12.5 mg total) by mouth every 4 (four) hours as needed. 30 tablet 0  . propranolol (INDERAL) 20 MG tablet Take 1 tablet (20 mg total) by mouth 3 (three) times daily. 270 tablet 1  . spironolactone (ALDACTONE) 100 MG tablet Take 1 tablet (100 mg total) by mouth 2 (two) times daily. 180 tablet 4  . tiZANidine (ZANAFLEX) 2 MG tablet Take 1 tablet at bedtime and 1 tablet for headache if needed. 60 tablet 6  . topiramate (TOPAMAX) 25 MG tablet Take 3 tablets (75 mg total) by mouth 2 (two)  times daily. 270 tablet 1  . topiramate ER (QUDEXY XR) 150 MG CS24 sprinkle capsule Take 1 capsule (150 mg total) by mouth at bedtime. 30 capsule 6  . topiramate ER (QUDEXY XR) 50 MG CS24 sprinkle capsule Take 1 capsule at bedtime for one week, then 2 capsules at bedtime for one week.  Then start the 130m capsules. 21 capsule 0   No current facility-administered medications on file prior to visit.    ALLERGIES: Allergies  Allergen Reactions  . Feraheme [Ferumoxytol] Itching    Iron infusion     FAMILY HISTORY: Family History  Problem Relation Age of Onset  . Coronary artery disease Mother   . Heart disease Mother   . Colon cancer Father   . Coronary artery disease Father   . Diabetes Father   . Cancer Father        lung  . Heart disease Father   . Prostate cancer Paternal Grandfather   . Cancer Paternal Grandfather        prostate  . Cancer Maternal Uncle        throat  . Lung cancer Paternal Grandmother   . Cancer Maternal Grandmother        lung    SOCIAL HISTORY: Social History   Socioeconomic History  . Marital status: Married    Spouse name: JMerry Proud . Number of children: 2  . Years of education: college  . Highest education level: Some college, no degree  Occupational History  . Occupation: RProgrammer, multimedia [employer]  Tobacco Use  . Smoking status: Never Smoker  . Smokeless tobacco: Never Used  Substance and Sexual Activity  . Alcohol use: No    Alcohol/week: 0.0 standard drinks  . Drug use: No  . Sexual activity: Yes    Partners: Male    Birth control/protection: Surgical    Comment: Hyst  Other Topics Concern  . Not on file  Social History Narrative   Patient lives at home with her husband (Merry Proud. Patient has two children and works full time for [employer]   Caffeine patient drinks a very mild amount not daily.   Social Determinants of Health   Financial Resource Strain:   . Difficulty of Paying Living Expenses:    Food Insecurity:   . Worried About RCharity fundraiserin the Last Year:   . RArboriculturistin the Last Year:   Transportation Needs:   . LFilm/video editor(Medical):   .Marland KitchenLack of Transportation (Non-Medical):   Physical Activity:   . Days of Exercise per Week:   . Minutes of Exercise per Session:   Stress:   . Feeling of Stress :   Social Connections:   . Frequency of Communication with Friends and Family:   . Frequency of Social Gatherings with Friends and Family:   . Attends Religious Services:   . Active Member of Clubs or Organizations:   . Attends CArchivistMeetings:   .Marland KitchenMarital Status:   Intimate Partner Violence:   . Fear of Current or Ex-Partner:   . Emotionally Abused:   .Marland KitchenPhysically Abused:   . Sexually Abused:     PHYSICAL EXAM: *** General: No acute distress.  Patient appears ***-groomed.   Head:  Normocephalic/atraumatic Eyes:  Fundi examined but not visualized Neck: supple, no paraspinal tenderness, full range of motion Heart:  Regular rate and rhythm Lungs:  Clear to auscultation bilaterally  Back: No paraspinal tenderness Neurological Exam: alert and oriented to person, place, and time. Attention span and concentration intact, recent and remote memory intact, fund of knowledge intact.  Speech fluent and not dysarthric, language intact.  CN II-XII intact. Bulk and tone normal, muscle strength 5/5 throughout.  Sensation to light touch, temperature and vibration intact.  Deep tendon reflexes 2+ throughout, toes downgoing.  Finger to nose and heel to shin testing intact.  Gait normal, Romberg negative.  IMPRESSION: 1.  Migraine without aura, without status migrainosus, not intractable 2.  Abnormal white matter changes on brain MRI, nonspecific and stable.  Continue to monitor. 3.  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  Likely asymptomatic.  She has bilateral sensorineural hearing loss, so likely noncontributory. 4.  Subjective memory  deficits, neuropsychological testing unremarkable.  PLAN: ***  Metta Clines, DO  CC:  Billey Chang, MD

## 2020-03-19 ENCOUNTER — Ambulatory Visit: Payer: 59 | Admitting: Neurology

## 2020-03-21 ENCOUNTER — Ambulatory Visit (INDEPENDENT_AMBULATORY_CARE_PROVIDER_SITE_OTHER): Payer: BC Managed Care – PPO | Admitting: Obstetrics & Gynecology

## 2020-03-21 ENCOUNTER — Encounter: Payer: Self-pay | Admitting: Obstetrics & Gynecology

## 2020-03-21 ENCOUNTER — Other Ambulatory Visit: Payer: Self-pay

## 2020-03-21 VITALS — BP 110/80 | HR 72 | Temp 97.0°F | Resp 16 | Ht 66.75 in | Wt 263.0 lb

## 2020-03-21 DIAGNOSIS — Z0000 Encounter for general adult medical examination without abnormal findings: Secondary | ICD-10-CM

## 2020-03-21 DIAGNOSIS — E2839 Other primary ovarian failure: Secondary | ICD-10-CM | POA: Diagnosis not present

## 2020-03-21 DIAGNOSIS — Z23 Encounter for immunization: Secondary | ICD-10-CM

## 2020-03-21 DIAGNOSIS — Z01419 Encounter for gynecological examination (general) (routine) without abnormal findings: Secondary | ICD-10-CM

## 2020-03-21 MED ORDER — ESZOPICLONE 3 MG PO TABS: ORAL_TABLET | ORAL | 1 refills | Status: DC

## 2020-03-21 MED ORDER — DESVENLAFAXINE SUCCINATE ER 100 MG PO TB24: 100.0000 mg | ORAL_TABLET | Freq: Every day | ORAL | 1 refills | Status: DC

## 2020-03-21 MED ORDER — AMOXICILLIN-POT CLAVULANATE 875-125 MG PO TABS: 1.0000 | ORAL_TABLET | Freq: Two times a day (BID) | ORAL | 0 refills | Status: DC

## 2020-03-21 MED ORDER — BUPROPION HCL ER (XL) 300 MG PO TB24: 300.0000 mg | ORAL_TABLET | Freq: Every day | ORAL | 1 refills | Status: DC

## 2020-03-21 MED ORDER — ESTRADIOL 0.1 MG/24HR TD PTTW: 1.0000 | MEDICATED_PATCH | TRANSDERMAL | 4 refills | Status: DC

## 2020-03-21 MED ORDER — SPIRONOLACTONE 100 MG PO TABS: 100.0000 mg | ORAL_TABLET | Freq: Two times a day (BID) | ORAL | 4 refills | Status: DC

## 2020-03-22 ENCOUNTER — Other Ambulatory Visit: Payer: Self-pay | Admitting: Obstetrics & Gynecology

## 2020-03-22 LAB — LIPID PANEL
Chol/HDL Ratio: 3.3 ratio (ref 0.0–4.4)
Cholesterol, Total: 190 mg/dL (ref 100–199)
HDL: 58 mg/dL (ref >39)
LDL Chol Calc (NIH): 114 mg/dL — ABNORMAL HIGH (ref 0–99)
Triglycerides: 99 mg/dL (ref 0–149)
VLDL Cholesterol Cal: 18 mg/dL (ref 5–40)

## 2020-03-22 LAB — COMPREHENSIVE METABOLIC PANEL WITH GFR
ALT: 21 IU/L (ref 0–32)
AST: 31 IU/L (ref 0–40)
Albumin/Globulin Ratio: 2 (ref 1.2–2.2)
Albumin: 4.5 g/dL (ref 3.8–4.9)
Alkaline Phosphatase: 77 IU/L (ref 48–121)
BUN/Creatinine Ratio: 14 (ref 9–23)
BUN: 13 mg/dL (ref 6–24)
Bilirubin Total: 0.5 mg/dL (ref 0.0–1.2)
CO2: 20 mmol/L (ref 20–29)
Calcium: 9.7 mg/dL (ref 8.7–10.2)
Chloride: 107 mmol/L — ABNORMAL HIGH (ref 96–106)
Creatinine, Ser: 0.93 mg/dL (ref 0.57–1.00)
GFR calc Af Amer: 81 mL/min/1.73
GFR calc non Af Amer: 70 mL/min/1.73
Globulin, Total: 2.2 g/dL (ref 1.5–4.5)
Glucose: 99 mg/dL (ref 65–99)
Potassium: 4.2 mmol/L (ref 3.5–5.2)
Sodium: 142 mmol/L (ref 134–144)
Total Protein: 6.7 g/dL (ref 6.0–8.5)

## 2020-03-22 LAB — CBC
Hematocrit: 43.7 % (ref 34.0–46.6)
Hemoglobin: 14.9 g/dL (ref 11.1–15.9)
MCH: 32.7 pg (ref 26.6–33.0)
MCHC: 34.1 g/dL (ref 31.5–35.7)
MCV: 96 fL (ref 79–97)
Platelets: 245 10*3/uL (ref 150–450)
RBC: 4.56 x10E6/uL (ref 3.77–5.28)
RDW: 12.3 % (ref 11.7–15.4)
WBC: 4.3 10*3/uL (ref 3.4–10.8)

## 2020-03-22 LAB — TSH: TSH: 2.6 u[IU]/mL (ref 0.450–4.500)

## 2020-03-22 LAB — HEMOGLOBIN A1C
Est. average glucose Bld gHb Est-mCnc: 100 mg/dL
Hgb A1c MFr Bld: 5.1 % (ref 4.8–5.6)

## 2020-03-22 MED ORDER — ESTRADIOL 0.1 MG/GM VA CREA: TOPICAL_CREAM | VAGINAL | 6 refills | Status: DC

## 2020-04-12 ENCOUNTER — Other Ambulatory Visit: Payer: Self-pay | Admitting: *Deleted

## 2020-04-12 NOTE — Telephone Encounter (Signed)
Spoke with patient. Patient requesting refill of Lunesta 3mg  tabs.  Advised patient Rx sent on 03/21/20 for #90/1RF.  F/u with pharmacy for filling. F/u with office if any additional questions.  Patient agreeable.   Encounter closed.

## 2020-04-18 ENCOUNTER — Encounter: Payer: Self-pay | Admitting: Surgery

## 2020-04-29 MED FILL — tiZANidine HCL 2 MG TABS: 2 | 30 days supply | Qty: 60 | Fill #4

## 2020-04-29 MED FILL — TOPIRAMATE 25 MG TABLET: 25 | 30 days supply | Qty: 180 | Fill #1

## 2020-05-08 ENCOUNTER — Ambulatory Visit: Payer: BC Managed Care – PPO | Admitting: Obstetrics & Gynecology

## 2020-05-17 NOTE — Progress Notes (Deleted)
GYNECOLOGY  VISIT  CC:   ***  HPI: 54 y.o. G3P2 Married White or Caucasian female here for follow up.  GYNECOLOGIC HISTORY: Patient's last menstrual period was 09/18/2009 (approximate). Contraception: hysterectomy Menopausal hormone therapy: estrace vaginal cream, vivelle dot patch  Patient Active Problem List   Diagnosis Date Noted  . Migraine headache without aura 10/30/2019  . 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 Status post gastric bypass for obesity 2011 10/30/2019  . Abnormal MRI of head 10/30/2019  . Major depression, recurrent, chronic ([place]) 10/30/2019  . Open angle with borderline findings, high risk, bilateral 06/04/2019  . Age-related nuclear cataract of both eyes 06/04/2019  . Primary insomnia 12/11/2018  . Sensorineural hearing loss (SNHL), bilateral 12/11/2018  . Leukopenia 12/11/2018  . Hypoglycemic reaction 12/11/2018  . Cervicogenic headache 12/11/2018  . Iron deficiency anemia secondary to inadequate dietary iron intake 09/22/2017  . Malabsorption of iron 09/22/2017  . Stress incontinence 09/11/2015  . History of endometrial cancer 02/10/2012  . Allergic rhinitis 10/02/2007    Past Medical History:  Diagnosis Date  . Brain tumor (benign) ([place])   . Elevated eye pressure   . Endometrioid adenocarcinoma    Stage IA grade 1  . Hearing aid worn    both ears  . IDA (iron deficiency anemia) 09/22/2017   Requiring iron infusions  . Infertility, female    clomid and IUI x 3  . Menorrhagia   . Migraine headaches   . Morbid obesity ([place])   . Rapid heartbeat   . 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 She has maintained over 100 pound weight loss.    Past Surgical History:  Procedure Laterality Date  . CESAREAN SECTION     x2  . DIAGNOSTIC LAPAROSCOPY     for infertility that was negative  . HERNIA REPAIR  3/12  . LAPAROSCOPIC CHOLECYSTECTOMY  1994  . LAPAROSCOPIC RADICAL  TOTAL HYSTERECTOMY W/ NODE BIOPSY  12/10   with BSO  . NASAL SINUS SURGERY    . 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 TONSILLECTOMY     x 3    MEDS:   Current Outpatient Medications on File Prior to Visit  Medication Sig Dispense Refill  . ALPRAZolam (XANAX) 0.5 MG tablet Take 1 tablet (0.5 mg total) by mouth daily as needed for anxiety. 30 tablet 3  . amoxicillin-clavulanate (AUGMENTIN) 875-125 MG tablet Take 1 tablet by mouth 2 (two) times daily. 14 tablet 0  . blood glucose meter kit and supplies KIT Dispense based on patient and insurance preference. Use up to four times daily as directed. (FOR ICD-9 250.00, 250.01). 1 each 0  . buPROPion (WELLBUTRIN XL) 300 MG 24 hr tablet Take 1 tablet (300 mg total) by mouth daily. 90 tablet 1  . desvenlafaxine (PRISTIQ) 100 MG 24 hr tablet Take 1 tablet (100 mg total) by mouth daily. 90 tablet 1  . estradiol (ESTRACE) 0.1 MG/GM vaginal cream 1 gram vaginally twice weekly 42.5 g 6  . estradiol (VIVELLE-DOT) 0.1 MG/24HR patch Place 1 patch (0.1 mg total) onto the skin 2 (two) times a week. 24 patch 4  . Eszopiclone 3 MG TABS TAKE 1 TABLET BY MOUTH IMMEDIATELY BEFORE BEDTIME 90 tablet 1  . glucose blood (FREESTYLE INSULINX TEST) test strip Use as instructed 100 each 12  . Lancets (FREESTYLE) lancets Use as instructed 100 each 12  . Multiple Vitamins-Minerals (MULTIVITAMIN PO) Take 1  tablet by mouth 2 (two) times a week.     . promethazine (PHENERGAN) 12.5 MG tablet Take 1 tablet (12.5 mg total) by mouth every 4 (four) hours as needed. 30 tablet 0  . propranolol (INDERAL) 20 MG tablet Take 1 tablet (20 mg total) by mouth 3 (three) times daily. 270 tablet 1  . spironolactone (ALDACTONE) 100 MG tablet Take 1 tablet (100 mg total) by mouth 2 (two) times daily. 180 tablet 4  . tiZANidine (ZANAFLEX) 2 MG tablet Take 1 tablet at bedtime and 1 tablet for headache if needed. 60 tablet 6  . topiramate (TOPAMAX) 25 MG tablet Take 3 tablets (75 mg total) by mouth 2  (two) times daily. 270 tablet 1   No current facility-administered medications on file prior to visit.    ALLERGIES: Feraheme [ferumoxytol]  Family History  Problem Relation Age of Onset  . Coronary artery disease Mother   . Heart disease Mother   . Colon cancer Father   . Coronary artery disease Father   . Diabetes Father   . Cancer Father        lung  . Heart disease Father   . Prostate cancer Paternal Grandfather   . Cancer Paternal Grandfather        prostate  . Cancer Maternal Uncle        throat  . Lung cancer Paternal Grandmother   . Cancer Maternal Grandmother        lung    SH:  ***  Review of Systems  PHYSICAL EXAMINATION:    LMP 09/18/2009 (Approximate)     General appearance: alert, cooperative and appears stated age Neck: no adenopathy, supple, symmetrical, trachea midline and thyroid {CHL AMB PHY EX THYROID NORM [phone]::"normal to inspection and palpation"} CV:  {Exam; heart brief:31539} Lungs:  {pe lungs ob:314451::"clear to auscultation, no wheezes, rales or rhonchi, symmetric air entry"} Breasts: {Exam; breast:13139::"normal appearance, no masses or tenderness"} Abdomen: soft, non-tender; bowel sounds normal; no masses,  no organomegaly Lymph:  no inguinal LAD noted  Pelvic: External genitalia:  no lesions              Urethra:  normal appearing urethra with no masses, tenderness or lesions              Bartholins and Skenes: normal                 Vagina: normal appearing vagina with normal color and discharge, no lesions              Cervix: {CHL AMB PHY EX CERVIX NORM DEFAULT:[phone]::"no lesions"}              Bimanual Exam:  Uterus:  {CHL AMB PHY EX UTERUS NORM DEFAULT:[phone]::"normal size, contour, position, consistency, mobility, non-tender"}              Adnexa: {CHL AMB PHY EX ADNEXA NO MASS DEFAULT:[phone]::"no mass, fullness, tenderness"}              Rectovaginal: {yes no:[id number]}.  Confirms.              Anus:   normal sphincter tone, no lesions  Chaperone, ***Terence Lux, CMA, was present for exam.  Assessment: ***  Plan: ***   ~{NUMBERS; -10-45 JOINT ROM:10287} minutes spent with patient >50% of time was in face to face discussion of above.

## 2020-05-20 ENCOUNTER — Ambulatory Visit: Payer: BC Managed Care – PPO | Admitting: Obstetrics & Gynecology

## 2020-05-30 ENCOUNTER — Other Ambulatory Visit: Payer: Self-pay | Admitting: Family Medicine

## 2020-05-30 MED FILL — TOPIRAMATE 25 MG TABLET: 25 | 30 days supply | Qty: 180 | Fill #2

## 2020-05-30 MED FILL — ALPRAZolam 0.5 MG TABS: 0.5 | 30 days supply | Qty: 30 | Fill #0

## 2020-05-30 MED FILL — tiZANidine HCL 2 MG TABS: 2 | 30 days supply | Qty: 60 | Fill #5

## 2020-06-12 ENCOUNTER — Encounter: Payer: BC Managed Care – PPO | Admitting: Family Medicine

## 2020-07-16 MED FILL — tiZANidine HCL 2 MG TABS: 2 | 30 days supply | Qty: 60 | Fill #6

## 2020-07-18 MED FILL — TOPIRAMATE 25 MG TABLET: 25 | 45 days supply | Qty: 270 | Fill #0

## 2020-07-29 NOTE — Progress Notes (Signed)
Virtual Visit via Video Note The purpose of this virtual visit is to provide medical care while limiting exposure to the novel coronavirus.    Consent was obtained for video visit:  Yes.   Answered questions that patient had about telehealth interaction:  Yes.   I discussed the limitations, risks, security and privacy concerns of performing an evaluation and management service by telemedicine. I also discussed with the patient that there may be a patient responsible charge related to this service. The patient expressed understanding and agreed to proceed.  Pt location: Home Physician Location: office Name of referring provider:  Leamon Arnt, MD I connected with Huey Romans at patients initiation/request on 07/31/2020 at  9:50 AM EDT by video enabled telemedicine application and verified that I am speaking with the correct person using two identifiers. Pt MRN:  [id number] Pt DOB:  11/12/1965 Video Participants:  Huey Romans   History of Present Illness:  Judith Holland is a 54 year old woman who follows up for migraines  UPDATE: Due to change in insurance, she had to switch from [place] to topiramate IR.  She has had increased headaches since then.  Also increased work-related stress may be contributing as well.  Also she has increased ibuprofen use as she fell and hurt herself.   Intensity:mild to severe Duration:May last a day.  Frequency:10-12 headache days in past 30 days (8 severe) Rescue protocol:  Promethazine and goes to bed. Current NSAIDS:diclofenac Current analgesics:no Current triptans:rizatriptan 63m.  Does not use it because of how it makes her feel. Current anti-emetic:promethazine Current muscle relaxants:tizanidine 265mat bedtime (neck pain) Current anti-anxiolytic:alprazolam Current sleep aide:eszopiclone Current Antihypertensive medications:propranolol 2044mID (palpitations, anxiety); spironolactone Current Antidepressant  medications:Bupropion, Pristiq Current Anticonvulsant medications:topiramate 52m44mD Current CGRP inhibitor:  none Current Vitamins/Herbal/Supplements:MVI, D, B12 Current Antihistamines/Decongestants:Flonase Other therapy:no Hormone/birth control: Estradiol  Caffeine:tea Alcohol:no Smoker:no Diet:hydrates Exercise:no Depression/anxiety: grieving since passing of her mother in September. Sleep hygiene:Okay with Lunesta  HISTORY: Onset: She has had headaches on and off since young adulthood. They became more frequent in 2016. Around that time, she also began experiencing memory deficits as well as poor coordination and vertigo. 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 MRI of brain without contrast from 08/22/15 revealed nonspecific small foci of T2 hyperintensity in the cerebral white matter.She reportedly had a repeat MRIin November 2017, but a comparison was not made. MS was considered. As for the headaches: Location:Above the right eye Quality:sharp Initial intensity:7/10 Aura:no Prodrome:no Postdrome:yes.  Fatigue, head soreness Associated symptoms: Photophobia. Sometimes nausea. No photophobia or visual disturbance. She has not had any new worse headache of her life, waking up from sleep InitialDuration:1 to 2 days InitialFrequency:Twice a month Triggers:If she forgets to take her estradiol,fatigue, onset of tension headache. Relieving factors: Turning lights down Activity:Cannot function.  She also has tension type headaches that begin at the back of her neck and radiate up to the front, non-throbbing, 3-4/10, lasting up to 3 days and occurring twice a month.  She also endorses pressure in back of the neck into the shoulders but repetitive movements of neck and arms may aggravate a mild diffuse headache lasting all day and daily. She can wake up with this headache.  She is not treating these headaches with analgesics. She denies blurred vision or visual obscurations. [facility] which did not demonstrate papilledema or other optic nerve involvement. She notes intermittent "heart beat" in  her ears.  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 No abnormal enhancement. Left greater than right dilated optic nerve sheaths noted, as well as partial empty sella, raising suspicion of idiopathic intracranial hypertension. She had a lumbar puncture on 03/21/19 which demonstrated a normal opening pressure of 8 cm H2O and closing pressure of 12 cm H2O. CSF analysis showed cell count 1, protein 35, glucose 56, negative culture and no oligoclonal bands.  Past NSAIDS:Cannot take most NSAIDs due to gastric bypass surgery Past analgesics:no Past abortive triptans:Sumatriptan tablet, Treximet (side effects) Past muscle relaxants:no Past anti-emetic:no Past antihypertensive medications:no Past antidepressant medications:no Past anticonvulsant medications:topiramate 110m, Qudexy XR 1544m(effective, stopping did not help memory) Past CGRP inhibitor:  Emgality (stopped due to possible complications with gastric bypass and potential for bowel obstruction) Past vitamins/Herbal/Supplements:no Other past therapies:no  Family history of headache:mom  Shehas reportedmemory deficits that precede initiation of topiramate. She has word-finding difficulty. Sometimes she cannot recall the name of people she knows. She will sometimes briefly get  disoriented while driving.  Labs from February and March showed TSH 1.12, B12 409, B1 11 and B2 6.5. She does have known iron deficiency anemia.   She underwent neuropsychological evaluation on 07/18/2019.  Testing demonstrated relative weaknesses across a task assessing spatial manipulation, as well as a task assessing concept formation and problem solving.  However these scores remain within normal limits.   Past Medical History: Past Medical History:  Diagnosis Date  . Brain tumor (benign) ([place]  . Elevated eye pressure   . Endometrioid adenocarcinoma    Stage IA grade 1  . Hearing aid worn    both ears  . IDA (iron deficiency anemia) 09/22/2017   Requiring iron infusions  . Infertility, female    clomid and IUI x 3  . Menorrhagia   . Migraine headaches   . Morbid obesity ([place]  . Rapid heartbeat   . 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 She has maintained over 100 pound weight loss.    Medications: Outpatient Encounter Medications as of 07/31/2020  Medication Sig  . ALPRAZolam (XANAX) 0.5 MG tablet TAKE 1 TABLET BY MOUTH ONCE A DAY AS NEEDED FOR ANXIETY  . amoxicillin-clavulanate (AUGMENTIN) 875-125 MG tablet Take 1 tablet by mouth 2 (two) times daily.  . blood glucose meter kit and supplies KIT Dispense based on patient and insurance preference. Use up to four times daily as directed. (FOR ICD-9 250.00, 250.01).  . Marland KitchenuPROPion (WELLBUTRIN XL) 300 MG 24 hr tablet Take 1 tablet (300 mg total) by mouth daily.  . Marland Kitchenesvenlafaxine (PRISTIQ) 100 MG 24 hr tablet Take 1 tablet (100 mg total) by mouth daily.  . Marland Kitchenstradiol (ESTRACE) 0.1 MG/GM vaginal cream 1 gram vaginally twice weekly  . estradiol (VIVELLE-DOT) 0.1 MG/24HR patch Place 1 patch (0.1 mg total) onto the skin 2 (two) times a week.  . Eszopiclone 3 MG TABS TAKE 1 TABLET BY MOUTH IMMEDIATELY BEFORE BEDTIME  . glucose blood (FREESTYLE INSULINX TEST) test strip Use as  instructed  . Lancets (FREESTYLE) lancets Use as instructed  . Multiple Vitamins-Minerals (MULTIVITAMIN PO) Take  1 tablet by mouth 2 (two) times a week.   . promethazine (PHENERGAN) 12.5 MG tablet Take 1 tablet (12.5 mg total) by mouth every 4 (four) hours as needed.  . propranolol (INDERAL) 20 MG tablet Take 1 tablet (20 mg total) by mouth 3 (three) times daily.  Marland Kitchen spironolactone (ALDACTONE) 100 MG tablet Take 1 tablet (100 mg total) by mouth 2 (two) times daily.  Marland Kitchen tiZANidine (ZANAFLEX) 2 MG tablet Take 1 tablet at bedtime and 1 tablet for headache if needed.  . topiramate (TOPAMAX) 25 MG tablet Take 3 tablets (75 mg total) by mouth 2 (two) times daily.   No facility-administered encounter medications on file as of 07/31/2020.    Allergies: Allergies  Allergen Reactions  . Feraheme [Ferumoxytol] Itching    Iron infusion     Family History: Family History  Problem Relation Age of Onset  . Coronary artery disease Mother   . Heart disease Mother   . Colon cancer Father   . Coronary artery disease Father   . Diabetes Father   . Cancer Father        lung  . Heart disease Father   . Prostate cancer Paternal Grandfather   . Cancer Paternal Grandfather        prostate  . Cancer Maternal Uncle        throat  . Lung cancer Paternal Grandmother   . Cancer Maternal Grandmother        lung    Social History: Social History   Socioeconomic History  . Marital status: Married    Spouse name: Merry Proud  . Number of children: 2  . Years of education: college  . Highest education level: Some college, no degree  Occupational History  . Occupation: Programmer, multimedia: [employer]  Tobacco Use  . Smoking status: Never Smoker  . Smokeless tobacco: Never Used  Vaping Use  . Vaping Use: Never used  Substance and Sexual Activity  . Alcohol use: No    Alcohol/week: 0.0 standard drinks  . Drug use: No  . Sexual activity: Yes    Partners: Male    Birth control/protection: Surgical     Comment: Hyst  Other Topics Concern  . Not on file  Social History Narrative   Patient lives at home with her husband Merry Proud). Patient has two children and works full time for [employer].   Caffeine patient drinks a very mild amount not daily.   Social Determinants of Health   Financial Resource Strain:   . Difficulty of Paying Living Expenses: Not on file  Food Insecurity:   . Worried About Charity fundraiser in the Last Year: Not on file  . Ran Out of Food in the Last Year: Not on file  Transportation Needs:   . Lack of Transportation (Medical): Not on file  . Lack of Transportation (Non-Medical): Not on file  Physical Activity:   . Days of Exercise per Week: Not on file  . Minutes of Exercise per Session: Not on file  Stress:   . Feeling of Stress : Not on file  Social Connections:   . Frequency of Communication with Friends and Family: Not on file  . Frequency of Social Gatherings with Friends and Family: Not on file  . Attends Religious Services: Not on file  . Active Member of Clubs or Organizations: Not on file  . Attends Archivist Meetings: Not on file  . Marital  Status: Not on file  Intimate Partner Violence:   . Fear of Current or Ex-Partner: Not on file  . Emotionally Abused: Not on file  . Physically Abused: Not on file  . Sexually Abused: Not on file    Observations/Objective:   Height _0  (1.702 m), weight 250 lb (113.4 kg), last menstrual period 09/18/2009. No acute distress.  Alert and oriented.  Speech fluent and not dysarthric.  Language intact.    Assessment and Plan:   Migraine without aura, without status migrainosus, not intractable  1.  Will work to restart her on Qudexy XR 190m daily 2.  For abortive therapy, will have her try Ubrelvy 1030m3.  Refilled promethazine for nausea 4.  Limit use of pain relievers to no more than 2 days out of week to prevent risk of rebound or medication-overuse  headache. 5.  Keep headache diary 6.  Follow up in 6 months.  Follow Up Instructions:    -I discussed the assessment and treatment plan with the patient. The patient was provided an opportunity to ask questions and all were answered. The patient agreed with the plan and demonstrated an understanding of the instructions.   The patient was advised to call back or seek an in-person evaluation if the symptoms worsen or if the condition fails to improve as anticipated.    AdDudley MajorDO

## 2020-07-31 ENCOUNTER — Other Ambulatory Visit: Payer: Self-pay

## 2020-07-31 ENCOUNTER — Encounter: Payer: Self-pay | Admitting: Neurology

## 2020-07-31 ENCOUNTER — Telehealth (INDEPENDENT_AMBULATORY_CARE_PROVIDER_SITE_OTHER): Payer: BC Managed Care – PPO | Admitting: Neurology

## 2020-07-31 VITALS — Ht 67.0 in | Wt 250.0 lb

## 2020-07-31 DIAGNOSIS — G43009 Migraine without aura, not intractable, without status migrainosus: Secondary | ICD-10-CM | POA: Diagnosis not present

## 2020-07-31 MED ORDER — PROMETHAZINE HCL 12.5 MG PO TABS: 12.5000 mg | ORAL_TABLET | ORAL | 5 refills | Status: DC | PRN

## 2020-07-31 MED ORDER — TOPIRAMATE ER 150 MG PO SPRINKLE CAP24: 150.0000 mg | EXTENDED_RELEASE_CAPSULE | Freq: Every day | ORAL | 5 refills | Status: DC

## 2020-08-02 ENCOUNTER — Encounter: Payer: Self-pay | Admitting: Neurology

## 2020-08-02 NOTE — Progress Notes (Addendum)
Per phone call with Shelton Silvas from [facility] she called insurance and was able to get patient approved through the financial assistance program for a 0$ copay for Qudexy medication. She said she tried to send txt msg to pt but no response to give her the update. Awaiting pt to confirm with them. Patient needs to call them back at [phone]. And if we have questions we can call Shelton Silvas back at [phone].

## 2020-08-09 ENCOUNTER — Other Ambulatory Visit: Payer: Self-pay | Admitting: Obstetrics & Gynecology

## 2020-08-09 DIAGNOSIS — Z01419 Encounter for gynecological examination (general) (routine) without abnormal findings: Secondary | ICD-10-CM

## 2020-08-09 NOTE — Telephone Encounter (Signed)
Medication refill request: Estradiol 0.01% cream Last AEX:  03/21/20 Next AEX: not yet scheduled Last MMG (if hormonal medication request): 10/18/19  Neg  Refill authorized: 126g/3  Requesting 90 day supply

## 2020-08-23 ENCOUNTER — Other Ambulatory Visit: Payer: Self-pay

## 2020-08-23 MED ORDER — ALPRAZOLAM 0.5 MG PO TABS: ORAL_TABLET | ORAL | 0 refills | Status: DC

## 2020-08-23 MED ORDER — ESZOPICLONE 3 MG PO TABS: ORAL_TABLET | ORAL | 1 refills | Status: DC

## 2020-08-23 NOTE — Telephone Encounter (Signed)
Medication refill request: Xanax 0.5 mg tablet take 1 tablet by mouth once a say as needed for anxiety Last AEX:  03/21/2020 Next AEX: Not currently scheduled Last MMG (if hormonal medication request): 10/18/2019 Birads 1 negative Refill authorized: #30 0RF pending    Medication refill request: Eszopiclone 3 mg tablets Take 1 tablet immediately before bedtime Last AEX:  03/21/2020 Next AEX: Not currently scheduled Last MMG (if hormonal medication request): 10/18/2019 Birads 1 negative Refill authorized: #90 1RF pending

## 2020-09-07 ENCOUNTER — Other Ambulatory Visit: Payer: Self-pay | Admitting: Neurology

## 2020-09-07 MED FILL — TOPIRAMATE 25 MG TABLET: 25 | 45 days supply | Qty: 270 | Fill #1

## 2020-09-09 ENCOUNTER — Other Ambulatory Visit: Payer: Self-pay | Admitting: Neurology

## 2020-09-09 MED FILL — tiZANidine HCL 2 MG TABS: 2 | 30 days supply | Qty: 60 | Fill #0

## 2020-10-15 ENCOUNTER — Other Ambulatory Visit: Payer: Self-pay | Admitting: Obstetrics & Gynecology

## 2020-10-15 NOTE — Telephone Encounter (Signed)
Medication refill request: Wellbutrin Last AEX:  03/21/20 Dr. Hyacinth Meeker Next AEX: none Last MMG (if hormonal medication request): n/a Refill authorized: today, please advise

## 2020-11-08 ENCOUNTER — Telehealth: Payer: Self-pay | Admitting: Neurology

## 2020-11-08 ENCOUNTER — Other Ambulatory Visit: Payer: Self-pay | Admitting: Neurology

## 2020-11-08 MED FILL — tiZANidine HCL 2 MG TABS: 2 | 30 days supply | Qty: 60 | Fill #1

## 2020-11-08 NOTE — Telephone Encounter (Signed)
Denied on 11/08/20

## 2020-12-04 ENCOUNTER — Other Ambulatory Visit: Payer: Self-pay | Admitting: Family Medicine

## 2020-12-04 ENCOUNTER — Other Ambulatory Visit: Payer: Self-pay | Admitting: Neurology

## 2020-12-04 DIAGNOSIS — G4489 Other headache syndrome: Secondary | ICD-10-CM

## 2020-12-04 MED FILL — tiZANidine HCL 2 MG TABS: 2 | 30 days supply | Qty: 60 | Fill #1

## 2021-01-10 ENCOUNTER — Other Ambulatory Visit: Payer: Self-pay | Admitting: Obstetrics and Gynecology

## 2021-01-10 ENCOUNTER — Other Ambulatory Visit: Payer: Self-pay | Admitting: *Deleted

## 2021-01-10 DIAGNOSIS — G4489 Other headache syndrome: Secondary | ICD-10-CM

## 2021-01-10 MED ORDER — DESVENLAFAXINE SUCCINATE ER 100 MG PO TB24: 100.0000 mg | ORAL_TABLET | Freq: Every day | ORAL | 0 refills | Status: DC

## 2021-01-10 MED ORDER — SPIRONOLACTONE 100 MG PO TABS: 100.0000 mg | ORAL_TABLET | Freq: Two times a day (BID) | ORAL | 0 refills | Status: DC

## 2021-01-10 MED ORDER — PROPRANOLOL HCL 20 MG PO TABS: 20.0000 mg | ORAL_TABLET | Freq: Three times a day (TID) | ORAL | 0 refills | Status: DC

## 2021-01-10 MED ORDER — BUPROPION HCL ER (XL) 300 MG PO TB24: 300.0000 mg | ORAL_TABLET | Freq: Every day | ORAL | 0 refills | Status: DC

## 2021-01-10 MED ORDER — ESTRADIOL 0.1 MG/24HR TD PTTW: 1.0000 | MEDICATED_PATCH | TRANSDERMAL | 0 refills | Status: DC

## 2021-01-10 MED ORDER — TOPIRAMATE ER 150 MG PO SPRINKLE CAP24: 150.0000 mg | EXTENDED_RELEASE_CAPSULE | Freq: Every day | ORAL | 0 refills | Status: DC

## 2021-01-10 MED FILL — DESVENLAFAXINE SUC ER 100 M: 100 | 90 days supply | Qty: 90 | Fill #0

## 2021-01-10 MED FILL — PROPRANOLOL 20 MG TABLET: 20 | 90 days supply | Qty: 270 | Fill #0

## 2021-01-10 MED FILL — TOPIRAMATE ER 150 MG CAP: 150 | 90 days supply | Qty: 90 | Fill #0

## 2021-01-10 MED FILL — DOTTI 0.1 MG/24HR PTTW: 0.1 | 84 days supply | Qty: 24 | Fill #0

## 2021-01-10 MED FILL — buPROPion HCL ER (XL) 300 M: 300 | 90 days supply | Qty: 90 | Fill #0

## 2021-01-10 MED FILL — SPIRONOLACTONE 100 MG TAB: 100 | 90 days supply | Qty: 180 | Fill #0

## 2021-01-10 NOTE — Telephone Encounter (Signed)
Please let her know that I refilled all of her scripts. Going forward, I would prefer her pcp manage her propranolol and Topiramate

## 2021-01-10 NOTE — Telephone Encounter (Signed)
Patient is requesting refill of the following:  Wellbutrin XL 300mg  tab PO daily Pristique 100 mg tab PO daily Spironolactone 100 mg tab BID Propranolol 20 mg tab tid -previously filled by PCP Vivelle Dot patch 0.1 mg Topiramate ER -previously filled by Neurology   Last AEX 03/21/20 Next AEX, not scheduled  MMG 10/18/19 at [place]. BiRads 1, negative  Patient aware overdue for MMG, will schedule ASAP. Will return call to schedule AEX.   Patient aware may need to f/u with PCP for refills of propranolol and Topiramate.   Confirmed pharmacy on file, will route to provider for review.    Routing to Dr. Talbert Nan

## 2021-01-10 NOTE — Telephone Encounter (Signed)
Patient notified. Encounter closed

## 2021-01-13 MED FILL — tiZANidine HCL 2 MG TABS: 2 | 30 days supply | Qty: 60 | Fill #2

## 2021-01-18 ENCOUNTER — Other Ambulatory Visit (HOSPITAL_COMMUNITY): Payer: Self-pay

## 2021-01-18 MED FILL — Topiramate Cap ER 24HR Sprinkle 150 MG: ORAL | 90 days supply | Qty: 90 | Fill #0 | Status: CN

## 2021-01-19 ENCOUNTER — Other Ambulatory Visit (HOSPITAL_COMMUNITY): Payer: Self-pay

## 2021-01-20 ENCOUNTER — Other Ambulatory Visit (HOSPITAL_COMMUNITY): Payer: Self-pay

## 2021-01-21 ENCOUNTER — Ambulatory Visit: Payer: BC Managed Care – PPO | Admitting: Neurology

## 2021-01-23 ENCOUNTER — Other Ambulatory Visit (HOSPITAL_COMMUNITY): Payer: Self-pay

## 2021-01-31 ENCOUNTER — Other Ambulatory Visit (HOSPITAL_COMMUNITY): Payer: Self-pay

## 2021-02-17 ENCOUNTER — Other Ambulatory Visit (HOSPITAL_COMMUNITY): Payer: Self-pay

## 2021-02-17 MED ORDER — ESZOPICLONE 3 MG PO TABS
3.0000 mg | ORAL_TABLET | Freq: Every evening | ORAL | 0 refills | Status: DC
Start: 2020-08-23 — End: 2021-04-07
  Filled 2021-02-17: qty 90, 90d supply, fill #0

## 2021-02-17 MED FILL — Tizanidine HCl Tab 2 MG (Base Equivalent): ORAL | 30 days supply | Qty: 60 | Fill #0 | Status: AC

## 2021-02-18 ENCOUNTER — Other Ambulatory Visit (HOSPITAL_COMMUNITY): Payer: Self-pay

## 2021-02-21 ENCOUNTER — Other Ambulatory Visit (HOSPITAL_COMMUNITY): Payer: Self-pay

## 2021-02-21 ENCOUNTER — Other Ambulatory Visit: Payer: Self-pay | Admitting: Obstetrics & Gynecology

## 2021-02-21 NOTE — Telephone Encounter (Signed)
Called and LMOM at 1:03 for patient to give [place] a call back about refill request. Patient needs to schedule annual/follow up with Dr. Sabra Heck to receive refills. tbw

## 2021-02-24 ENCOUNTER — Other Ambulatory Visit (HOSPITAL_COMMUNITY): Payer: Self-pay

## 2021-02-24 MED FILL — Eszopiclone Tab 3 MG: ORAL | Qty: 90 | Fill #0 | Status: CN

## 2021-03-14 ENCOUNTER — Ambulatory Visit (HOSPITAL_BASED_OUTPATIENT_CLINIC_OR_DEPARTMENT_OTHER): Payer: Self-pay | Admitting: Nurse Practitioner

## 2021-03-18 ENCOUNTER — Encounter (HOSPITAL_BASED_OUTPATIENT_CLINIC_OR_DEPARTMENT_OTHER): Payer: Self-pay

## 2021-03-19 ENCOUNTER — Other Ambulatory Visit (HOSPITAL_BASED_OUTPATIENT_CLINIC_OR_DEPARTMENT_OTHER): Payer: Self-pay | Admitting: Obstetrics & Gynecology

## 2021-03-19 ENCOUNTER — Other Ambulatory Visit (HOSPITAL_COMMUNITY): Payer: Self-pay

## 2021-03-19 DIAGNOSIS — Z1231 Encounter for screening mammogram for malignant neoplasm of breast: Secondary | ICD-10-CM

## 2021-03-19 DIAGNOSIS — E2839 Other primary ovarian failure: Secondary | ICD-10-CM

## 2021-03-27 MED FILL — Tizanidine HCl Tab 2 MG (Base Equivalent): ORAL | 30 days supply | Qty: 60 | Fill #1 | Status: CN

## 2021-03-28 ENCOUNTER — Other Ambulatory Visit (HOSPITAL_COMMUNITY): Payer: Self-pay

## 2021-03-28 MED FILL — Tizanidine HCl Tab 2 MG (Base Equivalent): ORAL | 27 days supply | Qty: 54 | Fill #1 | Status: AC

## 2021-03-28 MED FILL — Tizanidine HCl Tab 2 MG (Base Equivalent): ORAL | 3 days supply | Qty: 6 | Fill #1 | Status: AC

## 2021-03-31 ENCOUNTER — Other Ambulatory Visit (HOSPITAL_COMMUNITY): Payer: Self-pay

## 2021-03-31 ENCOUNTER — Ambulatory Visit (HOSPITAL_BASED_OUTPATIENT_CLINIC_OR_DEPARTMENT_OTHER): Payer: Self-pay

## 2021-03-31 ENCOUNTER — Ambulatory Visit (HOSPITAL_BASED_OUTPATIENT_CLINIC_OR_DEPARTMENT_OTHER): Payer: Self-pay | Admitting: Radiology

## 2021-04-04 ENCOUNTER — Other Ambulatory Visit (HOSPITAL_COMMUNITY): Payer: Self-pay

## 2021-04-07 ENCOUNTER — Encounter (HOSPITAL_BASED_OUTPATIENT_CLINIC_OR_DEPARTMENT_OTHER): Payer: Self-pay | Admitting: Nurse Practitioner

## 2021-04-07 ENCOUNTER — Ambulatory Visit (INDEPENDENT_AMBULATORY_CARE_PROVIDER_SITE_OTHER): Payer: 59 | Admitting: Nurse Practitioner

## 2021-04-07 ENCOUNTER — Other Ambulatory Visit: Payer: Self-pay

## 2021-04-07 ENCOUNTER — Encounter: Payer: Self-pay | Admitting: Family

## 2021-04-07 ENCOUNTER — Other Ambulatory Visit (HOSPITAL_COMMUNITY): Payer: Self-pay

## 2021-04-07 VITALS — BP 136/75 | HR 73 | Ht 67.0 in | Wt 269.6 lb

## 2021-04-07 DIAGNOSIS — E161 Other hypoglycemia: Secondary | ICD-10-CM

## 2021-04-07 DIAGNOSIS — C541 Malignant neoplasm of endometrium: Secondary | ICD-10-CM

## 2021-04-07 DIAGNOSIS — Z13220 Encounter for screening for lipoid disorders: Secondary | ICD-10-CM | POA: Diagnosis not present

## 2021-04-07 DIAGNOSIS — Z0000 Encounter for general adult medical examination without abnormal findings: Secondary | ICD-10-CM

## 2021-04-07 DIAGNOSIS — Z23 Encounter for immunization: Secondary | ICD-10-CM | POA: Diagnosis not present

## 2021-04-07 DIAGNOSIS — Z13228 Encounter for screening for other metabolic disorders: Secondary | ICD-10-CM

## 2021-04-07 DIAGNOSIS — Z6841 Body Mass Index (BMI) 40.0 and over, adult: Secondary | ICD-10-CM | POA: Diagnosis not present

## 2021-04-07 DIAGNOSIS — E215 Disorder of parathyroid gland, unspecified: Secondary | ICD-10-CM

## 2021-04-07 DIAGNOSIS — Z800 Family history of malignant neoplasm of digestive organs: Secondary | ICD-10-CM | POA: Insufficient documentation

## 2021-04-07 DIAGNOSIS — F5101 Primary insomnia: Secondary | ICD-10-CM

## 2021-04-07 DIAGNOSIS — Z1321 Encounter for screening for nutritional disorder: Secondary | ICD-10-CM

## 2021-04-07 DIAGNOSIS — R7989 Other specified abnormal findings of blood chemistry: Secondary | ICD-10-CM | POA: Insufficient documentation

## 2021-04-07 DIAGNOSIS — T753XXA Motion sickness, initial encounter: Secondary | ICD-10-CM

## 2021-04-07 DIAGNOSIS — F339 Major depressive disorder, recurrent, unspecified: Secondary | ICD-10-CM | POA: Diagnosis not present

## 2021-04-07 DIAGNOSIS — Z130 Encounter for screening for diseases of the blood and blood-forming organs and certain disorders involving the immune mechanism: Secondary | ICD-10-CM | POA: Diagnosis not present

## 2021-04-07 DIAGNOSIS — R790 Abnormal level of blood mineral: Secondary | ICD-10-CM | POA: Diagnosis not present

## 2021-04-07 DIAGNOSIS — Z1329 Encounter for screening for other suspected endocrine disorder: Secondary | ICD-10-CM | POA: Diagnosis not present

## 2021-04-07 DIAGNOSIS — E559 Vitamin D deficiency, unspecified: Secondary | ICD-10-CM | POA: Diagnosis not present

## 2021-04-07 DIAGNOSIS — F411 Generalized anxiety disorder: Secondary | ICD-10-CM | POA: Diagnosis not present

## 2021-04-07 DIAGNOSIS — K589 Irritable bowel syndrome without diarrhea: Secondary | ICD-10-CM | POA: Insufficient documentation

## 2021-04-07 DIAGNOSIS — Z9884 Bariatric surgery status: Secondary | ICD-10-CM

## 2021-04-07 HISTORY — DX: Motion sickness, initial encounter: T75.3XXA

## 2021-04-07 HISTORY — DX: Encounter for general adult medical examination without abnormal findings: Z00.00

## 2021-04-07 HISTORY — DX: Malignant neoplasm of endometrium: C54.1

## 2021-04-07 LAB — POCT GLYCOSYLATED HEMOGLOBIN (HGB A1C): HbA1c POC (<> result, manual entry): 4.9 % (ref 4.0–5.6)

## 2021-04-07 MED ORDER — ALPRAZOLAM 0.25 MG PO TABS
0.2500 mg | ORAL_TABLET | Freq: Every day | ORAL | 0 refills | Status: DC | PRN
  Filled 2021-04-07: qty 30, 30d supply, fill #0

## 2021-04-07 MED ORDER — FREESTYLE LITE TEST VI STRP
ORAL_STRIP | 12 refills | Status: AC
Start: 2021-04-07 — End: ?
  Filled 2021-04-07: qty 100, 25d supply, fill #0

## 2021-04-07 MED ORDER — FREESTYLE LANCETS MISC
12 refills | Status: AC
  Filled 2021-04-07: qty 100, 25d supply, fill #0

## 2021-04-07 MED ORDER — SCOPOLAMINE 1 MG/3DAYS TD PT72
1.0000 | MEDICATED_PATCH | TRANSDERMAL | 3 refills | Status: DC
Start: 2021-04-07 — End: 2022-10-13
  Filled 2021-04-07: qty 4, 12d supply, fill #0
  Filled 2021-08-21: qty 4, 12d supply, fill #1

## 2021-04-07 MED ORDER — BLOOD GLUCOSE MONITOR SYSTEM W/DEVICE KIT
PACK | 0 refills | Status: AC
  Filled 2021-04-07: qty 1, 1d supply, fill #0

## 2021-04-07 MED ORDER — ESZOPICLONE 1 MG PO TABS
1.0000 mg | ORAL_TABLET | Freq: Every evening | ORAL | 3 refills | Status: DC | PRN
  Filled 2021-04-07: qty 30, 30d supply, fill #0

## 2021-04-07 MED ORDER — ZOSTER VAC RECOMB ADJUVANTED 50 MCG/0.5ML IM SUSR: 0.5000 mL | Freq: Once | INTRAMUSCULAR | 1 refills | Status: AC

## 2021-04-07 NOTE — Assessment & Plan Note (Signed)
Historical- will monitor thyroid levels and CMP today for evaluation.

## 2021-04-07 NOTE — Assessment & Plan Note (Addendum)
Ongoing condition with exacerbation. Chart review reveals trials of sertraline, duloxetine, trazodone, and fluoxetine- currently on bupropion 300mg  and desvenlafaxine 100mg  daily.  Depression symptoms with partial response - consider genetic testing for appropriate treatment guidance. If patient declines, can consider changing desvenlafaxine to vilazodone or thyroid hormone, based on guideline recommendations.  Will discuss option with patient and ensure no history of seizure disorder is present before considering vilazodone, given the reduction of seizure threshold with both medications.  Will discuss psychiatry referral with patient for alternative recommendations and guidance, as well.  I do feel that she would benefit from restarting therapy with Helane Gunther- she is planning to see if she needs a referral for this and consider.

## 2021-04-07 NOTE — Assessment & Plan Note (Signed)
Will obtain labs for evaluation of nutritional and metabolic deficiencies.

## 2021-04-07 NOTE — Progress Notes (Addendum)
Worthy Keeler, DNP, AGNP-c Primary Care Services ______________________________________________________________________  HPI Judith Holland is a 55 y.o. female presenting to [facility] at [place] today to establish care.   Patient Care Team: Zuria Fosdick, Coralee Pesa, NP as PCP - General (Nurse Practitioner) Calvert Cantor, MD as Consulting Physician (Ophthalmology) Pieter Partridge, DO as Consulting Physician (Neurology)   Concerns today: CPE Requests CPE today for insurance purposes Depression Reports long standing hx of depression with medication management Was in counseling until October of last year- believes she is able to restart this without referral Currently on Bupropion XL 374m per day and desvenlafaxine 10237mper day. She has previously been on citalopram (stopped working) and Prozac (caused palpitations), duloxetine 37m31mand sertraline 73m23mAlso taking alprazolam 0.50 mg up to once per day PRN for panic symptoms and eszopiclone 3mg 57mbedtime nightly for insomnia.  She reports that these medications are helpful for her symptoms. She has tried amb[place] past, but this caused daytime mental fog and trazodone 73mg 45m feeling).  She reports her symptoms are not well controlled at this time. She has been under increased stress with job changes recently and this has triggered worsening symptoms.  She endorses tearfulness, sadness, and increased anxiety.  She would like medication change, if possible. Motion Sickness History of motion sickness and vertigo Going on a cruise in the near future and wishes to try scopolamine She has taken this in the past, side effects included blurry vision (mild) and dilated pupils- both tolerable Family Hx of CVD Would like to discuss this in the future Abdominal Pain LLQ "pulling" sensation noted intermittently with no provocation Lasts a few minutes then resolves Pressing on the area and getting up  to move around helps No changes in bowel habits, gas pains, nausea, or vomiting No ovaries Has had several abdominal surgeries, concerned for adhesions Weight Gain Hx of gastric bypass surgery Weight gain with increased stress and COVID Hx of Low Ferritin Normal Hgb levels IV iron for replacement in past Hx of Vit D Deficiency Taking daily OTC supplement Hx of hypoglycemia Reports symptomatic hypoglycemia hx Checks blood sugar at home for symptoms in order to provide treatment No current symptoms Out of supplies- glucometer battery has died and she needs a new one.   Patient Active Problem List   Diagnosis Date Noted   Dissociative episodes 12/24/2021   Chronic post-traumatic stress disorder (PTSD) 12/24/2021   Severe episode of recurrent major depressive disorder, without psychotic features (HCC) 0[place]8/2023   Agoraphobia 12/24/2021   Family history of colon cancer 12/11/2021   Estrogen deficiency 08/07/2021   Other headache syndrome 08/07/2021   Encounter for medical examination to establish care 04/07/2021   Generalized anxiety disorder 04/07/2021   Parathyroid abnormality (HCC) 0[place]0/2022   Abnormal liver function tests 04/07/2021   Family history of malignant neoplasm of gastrointestinal tract 04/07/2021   Irritable bowel syndrome 04/07/2021   Migraine headache without aura 10/30/2019   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 10/30/2019   Status post gastric bypass for obesity 2011 10/30/2019   Abnormal MRI of head 10/30/2019   Depression, recurrent (HCC) 0[place]1/2021   Open angle with borderline findings, high risk, bilateral 06/04/2019   Age-related nuclear cataract of both eyes 06/04/2019   Primary insomnia 12/11/2018   Sensorineural hearing loss (SNHL), bilateral 12/11/2018   Iron deficiency anemia secondary to inadequate dietary iron intake 09/22/2017   Malabsorption of iron 09/22/2017   History of endometrial cancer 02/10/2012  Health Maintenance Due  Topic Date  Due   Zoster Vaccines- Shingrix (1 of 2) Never done   COVID-19 Vaccine (3 - Pfizer risk series) 12/11/2019   MAMMOGRAM  10/17/2020   INFLUENZA VACCINE  05/19/2021    PHQ9 Today: Depression screen [facility] 2/9 12/11/2021 08/07/2021 04/07/2021  Decreased Interest 3 1 0  Down, Depressed, Hopeless _0 PHQ - 2 Score _1 Altered sleeping _2 Tired, decreased energy _3 Change in appetite _4 Feeling bad or failure about yourself  _5 Trouble concentrating _6 Moving slowly or fidgety/restless 3 0 0  Suicidal thoughts 0 0 0  PHQ-9 Score _7 Difficult doing work/chores Extremely dIfficult Very difficult Somewhat difficult   GAD7 Today: GAD 7 : Generalized Anxiety Score 12/11/2021 08/07/2021 04/07/2021  Nervous, Anxious, on Edge _8 Control/stop worrying _9 Worry too much - different things _10 Trouble relaxing _11 Restless 0 0 1  Easily annoyed or irritable _12 Afraid - awful might happen 3 0 0  Total GAD 7 Score _13 Anxiety Difficulty Extremely difficult Somewhat difficult Somewhat difficult   ______________________________________________________________________ PMH Past Medical History:  Diagnosis Date   Brain tumor (benign) (HCC)    Cervicogenic headache 12/11/2018   Elevated eye pressure    Endometrial cancer ([place]) 04/07/2021   Endometrioid adenocarcinoma    Stage IA grade 1   Hearing aid worn    both ears   Hypoglycemic reaction 12/11/2018   IDA (iron deficiency anemia) 09/22/2017   Requiring iron infusions   Infertility, female    clomid and IUI x 3   Iron deficiency anemia secondary to inadequate dietary iron intake 09/22/2017   Iron deficiency anemia secondary to inadequate dietary iron intake 09/22/2017   Leukopenia 12/11/2018   Menorrhagia    Migraine headaches    Morbid obesity ([place])    Motion sickness 04/07/2021   Rapid heartbeat    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 She has maintained over 100 pound weight loss.    ROS All review of systems negative except what is listed in the HPI  PHYSICAL EXAM Physical Exam Vitals and nursing note reviewed.  Constitutional:      Appearance: Normal appearance. She is obese.  HENT:     Head: Normocephalic and atraumatic.     Right Ear: Tympanic membrane, ear canal and external ear normal.     Left Ear: Tympanic membrane, ear canal and external ear normal.     Nose: Nose normal.     Mouth/Throat:     Mouth: Mucous membranes are moist.     Pharynx: Oropharynx is clear.  Eyes:     Extraocular Movements: Extraocular movements intact.     Conjunctiva/sclera: Conjunctivae normal.     Pupils: Pupils are equal, round, and reactive to light.  Neck:     Vascular: No carotid bruit.  Cardiovascular:     Rate and Rhythm: Normal rate and regular rhythm.     Pulses: Normal pulses.     Heart sounds: Normal heart sounds.  Pulmonary:     Effort: Pulmonary effort is normal.     Breath sounds: Normal breath sounds.  Abdominal:     General: Abdomen is flat. Bowel sounds are normal. There  is no distension.     Palpations: Abdomen is soft.     Tenderness: There is no abdominal tenderness. There is no right CVA tenderness, left CVA tenderness, guarding or rebound.  Musculoskeletal:        General: Normal range of motion.     Cervical back: Normal range of motion and neck supple.     Right lower leg: No edema.     Left lower leg: No edema.  Lymphadenopathy:     Cervical: No cervical adenopathy.  Skin:    General: Skin is warm and dry.     Capillary Refill: Capillary refill takes less than 2 seconds.  Neurological:     General: No focal deficit present.     Mental Status: She is alert and oriented to person, place, and time.     Cranial Nerves: No cranial nerve deficit.     Sensory: No sensory deficit.     Motor: No weakness.     Coordination: Coordination normal.     Gait: Gait normal.  Psychiatric:         Attention and Perception: Attention normal.        Mood and Affect: Affect is tearful.        Speech: Speech normal.        Behavior: Behavior normal.        Thought Content: Thought content normal.        Judgment: Judgment normal.   ______________________________________________________________________ ASSESSMENT AND PLAN Problem List Items Addressed This Visit     Depression, recurrent ([place])    Ongoing condition with exacerbation. Chart review reveals trials of sertraline, duloxetine, trazodone, and fluoxetine- currently on bupropion 364m and desvenlafaxine 1012mdaily.  Depression symptoms with partial response - consider genetic testing for appropriate treatment guidance. If patient declines, can consider changing desvenlafaxine to vilazodone or thyroid hormone, based on guideline recommendations.  Will discuss option with patient and ensure no history of seizure disorder is present before considering vilazodone, given the reduction of seizure threshold with both medications.  Will discuss psychiatry referral with patient for alternative recommendations and guidance, as well.  I do feel that she would benefit from restarting therapy with ShHelane Gunthershe is planning to see if she needs a referral for this and consider.         Relevant Orders   VITAMIN D 25 Hydroxy (Vit-D Deficiency, Fractures) (Completed)   CBC With Differential (Completed)   Comprehensive metabolic panel (Completed)   TSH (Completed)   Generalized anxiety disorder    Ongoing condition with exacerbation. Has tried sertraline, fluoxetine, cymbalta + bupropion, and now on pristique + bupropion. Not well controlled. PRN xanax- only taking on rare occassions with panic symptoms.  Will review medical history and make determination based on symptoms and previous medications.  Consider re-starting therapy with ShHelane Guntherif referral needed, please let me know.      Relevant Orders   VITAMIN D 25  Hydroxy (Vit-D Deficiency, Fractures) (Completed)   CBC With Differential (Completed)   Comprehensive metabolic panel (Completed)   TSH (Completed)   Primary insomnia    Refill on Lunesta 97m23moday- dose increase may be required based on historical administration.  F/U if symptoms are not well controlled.       RESOLVED: Hypoglycemic reaction    History of hypoglycemic episodes with symptoms Recommend intermittent monitoring of CBG with symptoms and when food intake is limited Recommend increased protein and light healthy snacking throughout the day as opposed to  three large meals to prevent fluctuation of blood glucose levels.  If symptoms not well controlled with dietary changes , endocrinology evaluation recommended      Relevant Medications   Blood Glucose Monitoring Suppl (BLOOD GLUCOSE MONITOR SYSTEM) w/Device KIT   glucose blood (FREESTYLE LITE) test strip   Lancets (FREESTYLE) lancets   Other Relevant Orders   Comprehensive metabolic panel (Completed)   POCT glycosylated hemoglobin (Hb A1C) (Completed)   TSH (Completed)   RESOLVED: Motion sickness    Will try scopolamine patch for upcoming cruise.        Relevant Medications   scopolamine (TRANSDERM-SCOP, 1.5 MG,) 1 MG/3DAYS   RESOLVED: Endometrial cancer ([place])    Historical- post hysterectomy Followed by Dr. Sabra Heck OB/GYN Will monitor labs today      Relevant Medications   scopolamine (TRANSDERM-SCOP, 1.5 MG,) 1 MG/3DAYS   Other Relevant Orders   Fe+TIBC+Fer (Completed)   CBC With Differential (Completed)   Comprehensive metabolic panel (Completed)   Lipid panel (Completed)   POCT glycosylated hemoglobin (Hb A1C) (Completed)   TSH (Completed)   Status post gastric bypass for obesity 2011    Will obtain labs for evaluation of nutritional and metabolic deficiencies.       Encounter for medical examination to establish care - Primary    CPE today for new patient. No concerning physical findings present.   Labs today.       Relevant Medications   Blood Glucose Monitoring Suppl (BLOOD GLUCOSE MONITOR SYSTEM) w/Device KIT   Other Relevant Orders   VITAMIN D 25 Hydroxy (Vit-D Deficiency, Fractures) (Completed)   Fe+TIBC+Fer (Completed)   CBC With Differential (Completed)   Comprehensive metabolic panel (Completed)   Lipid panel (Completed)   POCT glycosylated hemoglobin (Hb A1C) (Completed)   TSH (Completed)   Parathyroid abnormality (HCC)    Historical- will monitor thyroid levels and CMP today for evaluation.       Relevant Orders   TSH (Completed)   Other Visit Diagnoses     Need for shingles vaccine       Vitamin D deficiency       Relevant Orders   VITAMIN D 25 Hydroxy (Vit-D Deficiency, Fractures) (Completed)   Low serum ferritin level       Relevant Orders   Fe+TIBC+Fer (Completed)   TSH (Completed)   Screening for lipid disorders       Relevant Orders   Lipid panel (Completed)   Screening for endocrine, nutritional, metabolic and immunity disorder       Relevant Orders   VITAMIN D 25 Hydroxy (Vit-D Deficiency, Fractures) (Completed)   Fe+TIBC+Fer (Completed)   CBC With Differential (Completed)   Comprehensive metabolic panel (Completed)   Lipid panel (Completed)   POCT glycosylated hemoglobin (Hb A1C) (Completed)   TSH (Completed)   Body mass index (BMI) of 40.1-44.9 in adult [facility])       Relevant Orders   VITAMIN D 25 Hydroxy (Vit-D Deficiency, Fractures) (Completed)   CBC With Differential (Completed)   Comprehensive metabolic panel (Completed)   Lipid panel (Completed)   POCT glycosylated hemoglobin (Hb A1C) (Completed)   TSH (Completed)       Education provided today during visit and on AVS for patient to review at home.  Diet and Exercise recommendations provided.  Current diagnoses and recommendations discussed. HM recommendations reviewed with recommendations.    Outpatient Encounter Medications as of 04/07/2021  Medication Sig   Blood Glucose  Monitoring Suppl (BLOOD GLUCOSE MONITOR SYSTEM) w/Device KIT Use as  directed up to 4 times daily   fexofenadine (ALLEGRA) 180 MG tablet    glucose blood (FREESTYLE LITE) test strip Use as directed 4 times daily   Lancets (FREESTYLE) lancets Use as instructed 4 times daily   Multiple Vitamins-Minerals (MULTIVITAMIN PO) Take 1 tablet by mouth 2 (two) times a week.    scopolamine (TRANSDERM-SCOP, 1.5 MG,) 1 MG/3DAYS Place 1 patch (1.5 mg total) onto the skin every 3 (three) days.   [EXPIRED] Zoster Vaccine Adjuvanted [facility]) injection Inject 0.5 mLs into the muscle once for 1 dose. Repeat in 2-6 months. Please fax confirmation of vaccination to Jacolyn Reedy, DNP at [phone]   [DISCONTINUED] ALPRAZolam Duanne Moron) 0.25 MG tablet    [DISCONTINUED] blood glucose meter kit and supplies KIT Dispense based on patient and insurance preference. Use up to four times daily as directed. (FOR ICD-9 250.00, 250.01).   [DISCONTINUED] buPROPion (WELLBUTRIN XL) 300 MG 24 hr tablet TAKE 1 TABLET BY MOUTH DAILY.   [DISCONTINUED] buPROPion HCl (WELLBUTRIN PO)    [DISCONTINUED] desvenlafaxine (PRISTIQ) 100 MG 24 hr tablet TAKE 1 TABLET BY MOUTH ONCE A DAY   [DISCONTINUED] desvenlafaxine (PRISTIQ) 100 MG 24 hr tablet    [DISCONTINUED] estradiol (ESTRACE) 0.1 MG/GM vaginal cream APPLY VAGINALLY 1 GRAM TWICE WEEKLY AS DIRECTED   [DISCONTINUED] estradiol (VIVELLE-DOT) 0.025 MG/24HR    [DISCONTINUED] estradiol (VIVELLE-DOT) 0.1 MG/24HR patch PLACE 1 PATCH ONTO THE SKIN 2 TIMES A WEEK.   [DISCONTINUED] eszopiclone (LUNESTA) 1 MG TABS tablet    [DISCONTINUED] Eszopiclone 3 MG TABS Take 1 tablet (3 mg total) by mouth immediately before bedtime.   [DISCONTINUED] Eszopiclone 3 MG TABS Take 1 tablet (3 mg total) by mouth immediately before bedtime.   [DISCONTINUED] glucose blood (FREESTYLE INSULINX TEST) test strip Use as instructed   [DISCONTINUED] Lancets (FREESTYLE) lancets Use as instructed   [DISCONTINUED] promethazine  (PHENERGAN) 12.5 MG tablet Take 1 tablet (12.5 mg total) by mouth every 4 (four) hours as needed.   [DISCONTINUED] propranolol (INDERAL) 20 MG tablet TAKE 1 TABLET BY MOUTH 3 TIMES DAILY   [DISCONTINUED] spironolactone (ALDACTONE) 100 MG tablet TAKE 1 TABLET BY MOUTH 2 TIMES DAILY   [DISCONTINUED] spironolactone (ALDACTONE) 100 MG tablet    [DISCONTINUED] tiZANidine (ZANAFLEX) 2 MG tablet TAKE 1 TABLET BY MOUTH AT BEDTIME AND 1 TABLET FOR HEADACHE IF NEEDED   [DISCONTINUED] ALPRAZolam (XANAX) 0.25 MG tablet Take 1 tablet (0.25 mg total) by mouth daily as needed for panic symptoms   [DISCONTINUED] ALPRAZolam (XANAX) 0.5 MG tablet TAKE 1 TABLET BY MOUTH ONCE A DAY AS NEEDED FOR ANXIETY   [DISCONTINUED] amoxicillin-clavulanate (AUGMENTIN) 875-125 MG tablet Take 1 tablet by mouth 2 (two) times daily.   [DISCONTINUED] eszopiclone (LUNESTA) 1 MG TABS tablet Take 1 tablet (1 mg total) by mouth at bedtime as needed for sleep.   [DISCONTINUED] topiramate ER (QUDEXY XR) 150 MG CS24 sprinkle capsule TAKE 1 CAPSULE BY MOUTH ONCE A DAY   No facility-administered encounter medications on file as of 04/07/2021.    Return for TBD- CPE today.  Called to discuss treatment options with patient- VM left on phone. Will retry tomorrow.   Time: 85 minutes, >50% spent counseling, care coordination, chart review, and documentation.   Orma Render, DNP, AGNP-c

## 2021-04-07 NOTE — Patient Instructions (Addendum)
Recommendations from today's visit: I have given you a prescription for the shingles vaccine. You can have this done at your convenience.   We will information you of your lab results on MyChart- if labs are abnormal or need further evaluation we will call you  I will call you with recommendations for medication change- we can discuss follow-up at that time   Information on diet, exercise, and health maintenance recommendations are listed below. This is information to help you be sure you are on track for optimal health and monitoring.   Please look over this and let us know if you have any questions or if you have completed any of the health maintenance outside of [place] so that we can be sure your records are up to date.  ___________________________________________________________  Thank you for choosing [place] at [facility] for your Primary Care needs. I am excited for the opportunity to partner with you to meet your health care goals. It was a pleasure meeting you today!  I am an Adult-Geriatric Nurse Practitioner with a background in caring for patients for more than 20 years. I received my Paediatric nurse in Nursing and my Doctor of Nursing Practice degrees at [employer]. I received additional fellowship training in primary care and sports medicine after receiving my doctorate degree. I provide primary care and sports medicine services to patients age 55 and older within this office. I am also a provider with the [facility] and the director of the APP Fellowship with [facility].  I am a [place] native, but have called the [place] area home for nearly 20 years and am proud to be a member of this community.   I am passionate about providing the best service to you through preventive medicine and supportive care. I consider you a part of the medical team and value your input. I work diligently to ensure that you  are heard and your needs are met in a safe and effective manner. I want you to feel comfortable with me as your provider and want you to know that your health concerns are important to me.   For your information, our office hours are Monday- Friday 8:00 AM - 5:00 PM At this time I am not in the office on Wednesdays.  If you have questions or concerns, please call our office at [phone] or send [place] a MyChart message and we will respond as quickly as possible.   For all urgent or time sensitive needs we ask that you please call the office to avoid delays. MyChart is not constantly monitored and replies may take up to 72 business hours.  MyChart Policy: MyChart allows for you to see your visit notes, after visit summary, provider recommendations, lab and tests results, make an appointment, request refills, and contact your provider or the office for non-urgent questions or concerns.  Providers are seeing patients during normal business hours and do not have built in time to review MyChart messages. We ask that you allow a minimum of 72 business hours for MyChart message responses.  Complex MyChart concerns may require a visit. Your provider may request you schedule a virtual or in person visit to ensure we are providing the best care possible. MyChart messages sent after 4:00 PM on Friday will not be received by the provider until Monday morning.    Lab and Test Results: You will receive your lab and test results on MyChart as soon as  they are completed and results have been sent by the lab or testing facility. Due to this service, you will receive your results BEFORE your provider.  Please allow a minimum of 72 business hours for your provider to receive and review lab and test results and contact you about.   Most lab and test result comments from the provider will be sent through [place]. Your provider may recommend changes to the plan of care, follow-up visits, repeat testing, ask questions,  or request an office visit to discuss these results. You may reply directly to this message or call the office at [phone] to provide information for the provider or set up an appointment. In some instances, you will be called with test results and recommendations. Please let us know if this is preferred and we will make note of this in your chart to provide this for you.    If you have not heard a response to your lab or test results in 72 business hours, please call the office to let us know.   After Hours: For all non-emergency after hours needs, please call the office at [phone] and select the option to reach the on-call provider service. On-call services are shared between multiple [place] offices and therefore it will not be possible to speak directly with your provider. On-call providers may provide medical advice and recommendations, but are unable to provide refills for maintenance medications.  For all emergency or urgent medical needs after normal business hours, we recommend that you seek care at the closest Urgent Care or Emergency Department to ensure appropriate treatment in a timely manner.  [facility] at [place] has a 24 hour emergency room located on the ground floor for your convenience.    Please do not hesitate to reach out to [place] with concerns.   Thank you, again, for choosing me as your health care partner. I appreciate your trust and look forward to learning more about you.   Worthy Keeler, DNP, AGNP-c ___________________________________________________________  Health Maintenance Recommendations Screening Testing Mammogram Every 1 -2 years based on history and risk factors Starting at age 28 Pap Smear Ages 21-39 every 3 years Ages 61-65 every 5 years with HPV testing More frequent testing may be required based on results and history Colon Cancer Screening Every 1-10 years based on test performed, risk factors, and history Starting at age  11 Bone Density Screening Every 2-10 years based on history Starting at age 77 for women Recommendations for men differ based on medication usage, history, and risk factors AAA Screening One time ultrasound Men 97-90 years old who have every smoked Lung Cancer Screening Low Dose Lung CT every 12 months Age 49-80 years with a 30 pack-year smoking history who still smoke or who have quit within the last 15 years  Screening Labs Routine  Labs: Complete Blood Count (CBC), Complete Metabolic Panel (CMP), Cholesterol (Lipid Panel) Every 6-12 months based on history and medications May be recommended more frequently based on current conditions or previous results Hemoglobin A1c Lab Every 3-12 months based on history and previous results Starting at age 61 or earlier with diagnosis of diabetes, high cholesterol, BMI >26, and/or risk factors Frequent monitoring for patients with diabetes to ensure blood sugar control Thyroid Panel (TSH w/ T3 & T4) Every 6 months based on history, symptoms, and risk factors May be repeated more often if on medication HIV One time testing for all patients 87 and older May be repeated more frequently for patients with  increased risk factors or exposure Hepatitis C One time testing for all patients 18 and older May be repeated more frequently for patients with increased risk factors or exposure Gonorrhea, Chlamydia Every 12 months for all sexually active persons 13-24 years Additional monitoring may be recommended for those who are considered high risk or who have symptoms PSA Men 60-29 years old with risk factors Additional screening may be recommended from age 38-69 based on risk factors, symptoms, and history  Vaccine Recommendations Tetanus Booster All adults every 10 years Flu Vaccine All patients 6 months and older every year COVID Vaccine All patients 12 years and older Initial dosing with booster May recommend additional booster based on age  and health history HPV Vaccine 2 doses all patients age 72-26 Dosing may be considered for patients over 26 Shingles Vaccine (Shingrix) 2 doses all adults 52 years and older Pneumonia (Pneumovax 23) All adults 73 years and older May recommend earlier dosing based on health history Pneumonia (Prevnar 43) All adults 59 years and older Dosed 1 year after Pneumovax 23  Additional Screening, Testing, and Vaccinations may be recommended on an individualized basis based on family history, health history, risk factors, and/or exposure.  __________________________________________________________  Diet Recommendations for All Patients  I recommend that all patients maintain a diet low in saturated fats, carbohydrates, and cholesterol. While this can be challenging at first, it is not impossible and small changes can make big differences.  Things to try: Decreasing the amount of soda, sweet tea, and/or juice to one or less per day and replace with water While water is always the first choice, if you do not like water you may consider adding a water additive without sugar to improve the taste other sugar free drinks Replace potatoes with a brightly colored vegetable at dinner Use healthy oils, such as canola oil or olive oil, instead of butter or hard margarine Limit your bread intake to two pieces or less a day Replace regular pasta with low carb pasta options Bake, broil, or grill foods instead of frying Monitor portion sizes  Eat smaller, more frequent meals throughout the day instead of large meals  An important thing to remember is, if you love foods that are not great for your health, you don't have to give them up completely. Instead, allow these foods to be a reward when you have done well. Allowing yourself to still have special treats every once in a while is a nice way to tell yourself thank you for working hard to keep yourself healthy.   Also remember that every day is a new day. If  you have a bad day and "fall off the wagon", you can still climb right back up and keep moving along on your journey!  We have resources available to help you!  Some websites that may be helpful include: www.http://carter.biz/  Www.VeryWellFit.com _____________________________________________________________  Activity Recommendations for All Patients  I recommend that all adults get at least 20 minutes of moderate physical activity that elevates your heart rate at least 5 days out of the week.  Some examples include: Walking or jogging at a pace that allows you to carry on a conversation Cycling (stationary bike or outdoors) Water aerobics Yoga Weight lifting Dancing If physical limitations prevent you from putting stress on your joints, exercise in a pool or seated in a chair are excellent options.  Do determine your MAXIMUM heart rate for activity: YOUR AGE - 220 = MAX HeartRate   Remember! Do not push  yourself too hard.  Start slowly and build up your pace, speed, weight, time in exercise, etc.  Allow your body to rest between exercise and get good sleep. You will need more water than normal when you are exerting yourself. Do not wait until you are thirsty to drink. Drink with a purpose of getting in at least 8, 8 ounce glasses of water a day plus more depending on how much you exercise and sweat.    If you begin to develop dizziness, chest pain, abdominal pain, jaw pain, shortness of breath, headache, vision changes, lightheadedness, or other concerning symptoms, stop the activity and allow your body to rest. If your symptoms are severe, seek emergency evaluation immediately. If your symptoms are concerning, but not severe, please let us know so that we can recommend further evaluation.   ________________________________________________________________

## 2021-04-07 NOTE — Assessment & Plan Note (Signed)
Historical- post hysterectomy Followed by Dr. Sabra Heck OB/GYN Will monitor labs today

## 2021-04-07 NOTE — Assessment & Plan Note (Signed)
CPE today for new patient. No concerning physical findings present.  Labs today.

## 2021-04-07 NOTE — Progress Notes (Signed)
A1c normal

## 2021-04-07 NOTE — Assessment & Plan Note (Signed)
Refill on Lunesta 1mg  today- dose increase may be required based on historical administration.  F/U if symptoms are not well controlled.

## 2021-04-07 NOTE — Assessment & Plan Note (Signed)
Will try scopolamine patch for upcoming cruise.

## 2021-04-07 NOTE — Assessment & Plan Note (Signed)
Ongoing condition with exacerbation. Has tried sertraline, fluoxetine, cymbalta + bupropion, and now on pristique + bupropion. Not well controlled. PRN xanax- only taking on rare occassions with panic symptoms.  Will review medical history and make determination based on symptoms and previous medications.  Consider re-starting therapy with Helane Gunther- if referral needed, please let me know.

## 2021-04-07 NOTE — Assessment & Plan Note (Signed)
History of hypoglycemic episodes with symptoms Recommend intermittent monitoring of CBG with symptoms and when food intake is limited Recommend increased protein and light healthy snacking throughout the day as opposed to three large meals to prevent fluctuation of blood glucose levels.  If symptoms not well controlled with dietary changes , endocrinology evaluation recommended

## 2021-04-08 ENCOUNTER — Other Ambulatory Visit (HOSPITAL_COMMUNITY): Payer: Self-pay

## 2021-04-08 LAB — CBC WITH DIFFERENTIAL
Basophils Absolute: 0 10*3/uL (ref 0.0–0.2)
Basos: 0 %
EOS (ABSOLUTE): 0.1 10*3/uL (ref 0.0–0.4)
Eos: 3 %
Hematocrit: 43.6 % (ref 34.0–46.6)
Hemoglobin: 14.8 g/dL (ref 11.1–15.9)
Immature Grans (Abs): 0 10*3/uL (ref 0.0–0.1)
Immature Granulocytes: 0 %
Lymphocytes Absolute: 1.2 10*3/uL (ref 0.7–3.1)
Lymphs: 25 %
MCH: 32.1 pg (ref 26.6–33.0)
MCHC: 33.9 g/dL (ref 31.5–35.7)
MCV: 95 fL (ref 79–97)
Monocytes Absolute: 0.5 10*3/uL (ref 0.1–0.9)
Monocytes: 10 %
Neutrophils Absolute: 2.9 10*3/uL (ref 1.4–7.0)
Neutrophils: 62 %
RBC: 4.61 x10E6/uL (ref 3.77–5.28)
RDW: 12.4 % (ref 11.7–15.4)
WBC: 4.7 10*3/uL (ref 3.4–10.8)

## 2021-04-08 LAB — LIPID PANEL
Chol/HDL Ratio: 3.1 ratio (ref 0.0–4.4)
Cholesterol, Total: 201 mg/dL — ABNORMAL HIGH (ref 100–199)
HDL: 65 mg/dL (ref >39)
LDL Chol Calc (NIH): 117 mg/dL — ABNORMAL HIGH (ref 0–99)
Triglycerides: 106 mg/dL (ref 0–149)
VLDL Cholesterol Cal: 19 mg/dL (ref 5–40)

## 2021-04-08 LAB — COMPREHENSIVE METABOLIC PANEL
ALT: 19 IU/L (ref 0–32)
AST: 25 IU/L (ref 0–40)
Albumin/Globulin Ratio: 2.2 (ref 1.2–2.2)
Albumin: 4.6 g/dL (ref 3.8–4.9)
Alkaline Phosphatase: 77 IU/L (ref 44–121)
BUN/Creatinine Ratio: 11 (ref 9–23)
BUN: 10 mg/dL (ref 6–24)
Bilirubin Total: 0.5 mg/dL (ref 0.0–1.2)
CO2: 26 mmol/L (ref 20–29)
Calcium: 9.6 mg/dL (ref 8.7–10.2)
Chloride: 103 mmol/L (ref 96–106)
Creatinine, Ser: 0.94 mg/dL (ref 0.57–1.00)
Globulin, Total: 2.1 g/dL (ref 1.5–4.5)
Glucose: 99 mg/dL (ref 65–99)
Potassium: 4.3 mmol/L (ref 3.5–5.2)
Sodium: 139 mmol/L (ref 134–144)
Total Protein: 6.7 g/dL (ref 6.0–8.5)
eGFR: 72 mL/min/{1.73_m2} (ref >59)

## 2021-04-08 LAB — SPECIMEN STATUS REPORT

## 2021-04-08 LAB — VITAMIN D 25 HYDROXY (VIT D DEFICIENCY, FRACTURES): Vit D, 25-Hydroxy: 32.4 ng/mL (ref 30.0–100.0)

## 2021-04-08 LAB — IRON,TIBC AND FERRITIN PANEL
Ferritin: 29 ng/mL (ref 15–150)
Iron Saturation: 27 % (ref 15–55)
Iron: 128 ug/dL (ref 27–159)
Total Iron Binding Capacity: 466 ug/dL — ABNORMAL HIGH (ref 250–450)
UIBC: 338 ug/dL (ref 131–425)

## 2021-04-08 LAB — TSH: TSH: 2.09 u[IU]/mL (ref 0.450–4.500)

## 2021-04-11 NOTE — Progress Notes (Signed)
Vitamin D improved- still on the lower end of normal.   TIBC is elevated but no indication of anemia.   LDL a little elevated, but HDL looks great so no concerns at this time.   All other labs WNL

## 2021-04-15 ENCOUNTER — Ambulatory Visit (HOSPITAL_BASED_OUTPATIENT_CLINIC_OR_DEPARTMENT_OTHER): Payer: Self-pay

## 2021-04-15 ENCOUNTER — Ambulatory Visit (HOSPITAL_BASED_OUTPATIENT_CLINIC_OR_DEPARTMENT_OTHER): Payer: Self-pay | Admitting: Radiology

## 2021-04-16 ENCOUNTER — Encounter (HOSPITAL_BASED_OUTPATIENT_CLINIC_OR_DEPARTMENT_OTHER): Payer: Self-pay | Admitting: Nurse Practitioner

## 2021-04-24 ENCOUNTER — Other Ambulatory Visit (HOSPITAL_COMMUNITY): Payer: Self-pay

## 2021-04-25 ENCOUNTER — Other Ambulatory Visit (HOSPITAL_COMMUNITY): Payer: Self-pay

## 2021-04-25 ENCOUNTER — Encounter: Payer: Self-pay | Admitting: Family

## 2021-04-25 ENCOUNTER — Other Ambulatory Visit (HOSPITAL_BASED_OUTPATIENT_CLINIC_OR_DEPARTMENT_OTHER): Payer: Self-pay | Admitting: Nurse Practitioner

## 2021-04-25 DIAGNOSIS — F339 Major depressive disorder, recurrent, unspecified: Secondary | ICD-10-CM

## 2021-04-25 DIAGNOSIS — Z20822 Contact with and (suspected) exposure to covid-19: Secondary | ICD-10-CM | POA: Diagnosis not present

## 2021-04-25 DIAGNOSIS — F411 Generalized anxiety disorder: Secondary | ICD-10-CM

## 2021-04-25 MED ORDER — ESZOPICLONE 3 MG PO TABS
ORAL_TABLET | ORAL | 1 refills | Status: DC
  Filled 2021-04-25: qty 90, fill #0
  Filled 2021-04-25: qty 90, 90d supply, fill #0
  Filled 2021-07-27: qty 90, 90d supply, fill #1

## 2021-04-25 MED ORDER — ALPRAZOLAM 0.5 MG PO TABS
0.5000 mg | ORAL_TABLET | Freq: Three times a day (TID) | ORAL | 0 refills | Status: DC | PRN
Start: 2021-04-25 — End: 2021-08-07
  Filled 2021-04-25: qty 90, 30d supply, fill #0

## 2021-04-25 MED ORDER — DESVENLAFAXINE SUCCINATE ER 100 MG PO TB24
ORAL_TABLET | Freq: Every day | ORAL | 0 refills | Status: DC
  Filled 2021-04-25: qty 90, 90d supply, fill #0

## 2021-04-25 MED FILL — Tizanidine HCl Tab 2 MG (Base Equivalent): ORAL | 30 days supply | Qty: 60 | Fill #2 | Status: AC

## 2021-05-06 ENCOUNTER — Telehealth (HOSPITAL_BASED_OUTPATIENT_CLINIC_OR_DEPARTMENT_OTHER): Payer: Self-pay

## 2021-05-06 NOTE — Telephone Encounter (Signed)
Patient left a voicemail stating she has Covid with symptoms of nasal congestion, chills, coughing, body soreness and headache with no fever.   Called patient back and encouraged her per provider to treat the symptoms with Mucinex, alternate Tylenol and Advil, stay hydrated and rest.

## 2021-05-26 ENCOUNTER — Encounter (HOSPITAL_BASED_OUTPATIENT_CLINIC_OR_DEPARTMENT_OTHER): Payer: Self-pay | Admitting: Nurse Practitioner

## 2021-06-02 MED FILL — Tizanidine HCl Tab 2 MG (Base Equivalent): ORAL | 30 days supply | Qty: 60 | Fill #3 | Status: AC

## 2021-06-03 ENCOUNTER — Other Ambulatory Visit (HOSPITAL_COMMUNITY): Payer: Self-pay

## 2021-06-03 ENCOUNTER — Ambulatory Visit (HOSPITAL_BASED_OUTPATIENT_CLINIC_OR_DEPARTMENT_OTHER): Payer: 59

## 2021-06-04 ENCOUNTER — Ambulatory Visit (HOSPITAL_BASED_OUTPATIENT_CLINIC_OR_DEPARTMENT_OTHER): Payer: 59 | Attending: Obstetrics & Gynecology | Admitting: Radiology

## 2021-06-04 ENCOUNTER — Ambulatory Visit (HOSPITAL_BASED_OUTPATIENT_CLINIC_OR_DEPARTMENT_OTHER): Admission: RE | Admit: 2021-06-04 | Payer: 59 | Source: Ambulatory Visit

## 2021-06-17 ENCOUNTER — Telehealth (HOSPITAL_BASED_OUTPATIENT_CLINIC_OR_DEPARTMENT_OTHER): Payer: Self-pay | Admitting: Obstetrics & Gynecology

## 2021-06-17 NOTE — Telephone Encounter (Signed)
Patient is scheduled for 07/10/2021 '@4'$ :35 pm with you .  She haven't had her MMG and BMD

## 2021-06-18 ENCOUNTER — Other Ambulatory Visit (HOSPITAL_BASED_OUTPATIENT_CLINIC_OR_DEPARTMENT_OTHER): Payer: Self-pay | Admitting: Obstetrics & Gynecology

## 2021-06-18 DIAGNOSIS — E2839 Other primary ovarian failure: Secondary | ICD-10-CM

## 2021-06-18 DIAGNOSIS — Z1231 Encounter for screening mammogram for malignant neoplasm of breast: Secondary | ICD-10-CM

## 2021-07-10 ENCOUNTER — Ambulatory Visit (HOSPITAL_BASED_OUTPATIENT_CLINIC_OR_DEPARTMENT_OTHER): Payer: 59

## 2021-07-10 ENCOUNTER — Ambulatory Visit (HOSPITAL_BASED_OUTPATIENT_CLINIC_OR_DEPARTMENT_OTHER): Payer: Self-pay | Admitting: Radiology

## 2021-07-10 ENCOUNTER — Ambulatory Visit (HOSPITAL_BASED_OUTPATIENT_CLINIC_OR_DEPARTMENT_OTHER): Payer: 59 | Admitting: Obstetrics & Gynecology

## 2021-07-10 DIAGNOSIS — H43393 Other vitreous opacities, bilateral: Secondary | ICD-10-CM | POA: Diagnosis not present

## 2021-07-10 DIAGNOSIS — H43813 Vitreous degeneration, bilateral: Secondary | ICD-10-CM | POA: Diagnosis not present

## 2021-07-10 DIAGNOSIS — H35412 Lattice degeneration of retina, left eye: Secondary | ICD-10-CM | POA: Diagnosis not present

## 2021-07-10 DIAGNOSIS — H2513 Age-related nuclear cataract, bilateral: Secondary | ICD-10-CM | POA: Diagnosis not present

## 2021-07-28 ENCOUNTER — Other Ambulatory Visit (HOSPITAL_COMMUNITY): Payer: Self-pay

## 2021-07-31 ENCOUNTER — Encounter (HOSPITAL_BASED_OUTPATIENT_CLINIC_OR_DEPARTMENT_OTHER): Payer: Self-pay | Admitting: Nurse Practitioner

## 2021-07-31 ENCOUNTER — Encounter (HOSPITAL_BASED_OUTPATIENT_CLINIC_OR_DEPARTMENT_OTHER): Payer: Self-pay

## 2021-08-07 ENCOUNTER — Encounter (HOSPITAL_BASED_OUTPATIENT_CLINIC_OR_DEPARTMENT_OTHER): Payer: Self-pay | Admitting: Nurse Practitioner

## 2021-08-07 ENCOUNTER — Other Ambulatory Visit (HOSPITAL_COMMUNITY): Payer: Self-pay

## 2021-08-07 ENCOUNTER — Ambulatory Visit (INDEPENDENT_AMBULATORY_CARE_PROVIDER_SITE_OTHER): Payer: 59 | Admitting: Nurse Practitioner

## 2021-08-07 ENCOUNTER — Other Ambulatory Visit: Payer: Self-pay

## 2021-08-07 VITALS — Ht 67.0 in | Wt 260.0 lb

## 2021-08-07 DIAGNOSIS — F339 Major depressive disorder, recurrent, unspecified: Secondary | ICD-10-CM

## 2021-08-07 DIAGNOSIS — G4489 Other headache syndrome: Secondary | ICD-10-CM | POA: Diagnosis not present

## 2021-08-07 DIAGNOSIS — F411 Generalized anxiety disorder: Secondary | ICD-10-CM

## 2021-08-07 DIAGNOSIS — E2839 Other primary ovarian failure: Secondary | ICD-10-CM | POA: Insufficient documentation

## 2021-08-07 DIAGNOSIS — G43009 Migraine without aura, not intractable, without status migrainosus: Secondary | ICD-10-CM | POA: Diagnosis not present

## 2021-08-07 DIAGNOSIS — F5101 Primary insomnia: Secondary | ICD-10-CM

## 2021-08-07 HISTORY — DX: Other headache syndrome: G44.89

## 2021-08-07 MED ORDER — TIZANIDINE HCL 2 MG PO TABS
ORAL_TABLET | ORAL | 6 refills | Status: DC
  Filled 2021-08-07: qty 60, 30d supply, fill #0
  Filled 2021-09-12: qty 60, 30d supply, fill #1
  Filled 2021-10-17: qty 60, 30d supply, fill #2
  Filled 2021-11-18: qty 60, 30d supply, fill #3

## 2021-08-07 MED ORDER — DESVENLAFAXINE SUCCINATE ER 100 MG PO TB24
100.0000 mg | ORAL_TABLET | Freq: Every day | ORAL | 0 refills | Status: DC
  Filled 2021-08-07: qty 90, fill #0
  Filled 2021-08-08: qty 90, 90d supply, fill #0

## 2021-08-07 MED ORDER — ESTRADIOL 0.1 MG/GM VA CREA
TOPICAL_CREAM | VAGINAL | 3 refills | Status: DC
Start: 2021-08-07 — End: 2022-01-16
  Filled 2021-08-07: qty 42.5, fill #0

## 2021-08-07 MED ORDER — ESZOPICLONE 3 MG PO TABS
3.0000 mg | ORAL_TABLET | Freq: Every day | ORAL | 3 refills | Status: DC
  Filled 2021-08-07: qty 90, fill #0
  Filled 2021-10-24: qty 90, 90d supply, fill #0

## 2021-08-07 MED ORDER — BUPROPION HCL ER (XL) 150 MG PO TB24
ORAL_TABLET | ORAL | 3 refills | Status: DC
  Filled 2021-08-07: qty 90, 45d supply, fill #0

## 2021-08-07 MED ORDER — ESTRADIOL 0.1 MG/24HR TD PTTW
1.0000 | MEDICATED_PATCH | TRANSDERMAL | 3 refills | Status: DC
Start: 2021-08-07 — End: 2022-01-16
  Filled 2021-08-07: qty 24, fill #0
  Filled 2021-10-10: qty 24, 84d supply, fill #0

## 2021-08-07 MED ORDER — PROPRANOLOL HCL 20 MG PO TABS
20.0000 mg | ORAL_TABLET | Freq: Three times a day (TID) | ORAL | 3 refills | Status: DC
  Filled 2021-08-07: qty 270, 90d supply, fill #0

## 2021-08-07 MED ORDER — ALPRAZOLAM 0.5 MG PO TABS
0.5000 mg | ORAL_TABLET | Freq: Three times a day (TID) | ORAL | 0 refills | Status: DC | PRN
  Filled 2021-08-07 – 2021-08-08 (×2): qty 90, 30d supply, fill #0

## 2021-08-07 NOTE — Assessment & Plan Note (Signed)
Severe exacerbation with recent stop of Wellbutrin.  Will restart wellbutrin and titrate up to previous dose. Consider increase to 450mg  if previous dose not effective.  Once more stable, will consider transition from [place] to see if we can get better control. Prior to stopping wellbutrin her control was sub optimal.  Discussed recommendation to take time off of work to allow medication adjustments and recovery time. I do feel her current work situation is increasing her anxiety and depression symptoms.  Letter for 3 week leave sent to patient via [facility]. She will send FMLA paperwork if required.  F/U in 7-10 days

## 2021-08-07 NOTE — Assessment & Plan Note (Signed)
Exacerbation of insomnia with recent change in mood.  Will send refill of Lunesta today and work to get her back on her other medications and her mood stabilized.  If this is not effective, will consider alternative medications to help with sleep.

## 2021-08-07 NOTE — Assessment & Plan Note (Signed)
Well controlled with propranolol. Refills sent today

## 2021-08-07 NOTE — Progress Notes (Signed)
Virtual Visit Encounter telephone visit.   I connected with  Huey Romans on 08/07/21 at  3:30 PM EDT by secure audio and/or video enabled telemedicine application. I verified that I am speaking with the correct person using two identifiers.   I introduced myself as a Designer, jewellery with the practice. The limitations of evaluation and management by telemedicine discussed with the patient and the availability of in person appointments. The patient expressed verbal understanding and consent to proceed.  Participating parties in this visit include: Myself and patient  The patient is: Patient Location: Home I am: Provider Location: Office/Clinic Subjective:    CC and HPI: Judith Holland is a 55 y.o. year old female presenting for follow up of depression and anxiety. Gay Filler tells me that she ran out of her wellbutrin a few months ago and decided to trial time off from the medication to see if she needed it. She was not sure the dose was doing much for her depression and anxiety as she felt it was not under optimal control.  At the time that she stopped taking the medication, she also changed jobs with the same employer.  She tells me initially her symptoms were well managed, but over the past month or two they have escalated exponentially.  She endorses debilitating anxiety to the point that she is fearful of riding in the car. She endorses inability to control her worrying, sleep interruption, and ruminating thoughts. She also endorses depression symptoms of not wanting to leave her home, lack of motivation, constant crying and tearfulness, and dysphoric mood.  Her symptoms have affected both her home and work environment.  She reports "I am a mess".   She feels that her symptoms are exacerbated by the recent change in positions and the expectations placed before her without the compensation or appreciation. She endorses feeling devalued and that her work environment has increased her  symptoms.   She denies any thoughts of self harm or harm to others at this time.   Past medical history, Surgical history, Family history not pertinant except as noted below, Social history, Allergies, and medications have been entered into the medical record, reviewed, and corrections made.   Review of Systems:  All review of systems negative except what is listed in the HPI  Objective:    Alert and oriented x 4 Speaking in clear sentences with no shortness of breath. Crying and tearful throughout the visit. Emotional distress present.  Impression and Recommendations:    Problem List Items Addressed This Visit     Primary insomnia    Exacerbation of insomnia with recent change in mood.  Will send refill of Lunesta today and work to get her back on her other medications and her mood stabilized.  If this is not effective, will consider alternative medications to help with sleep.       Relevant Medications   Eszopiclone 3 MG TABS   Migraine headache without aura    Well controlled with propranolol. Refills sent today      Relevant Medications   buPROPion (WELLBUTRIN XL) 150 MG 24 hr tablet   desvenlafaxine (PRISTIQ) 100 MG 24 hr tablet   propranolol (INDERAL) 20 MG tablet   tiZANidine (ZANAFLEX) 2 MG tablet   Depression, recurrent (HCC) - Primary    Recent self stop of wellbutrin when prescription ran out causing severe relapse of symptoms. Will restart Wellbutrin at 150mg  with dose increase to 300mg  in 3 days. Consider 450mg  dosing after 2 weeks  if this is not effective.  May also consider transition from [place] to alternative medication that may be more beneficial for her. She has tried multiple medications without success in the past. Consider Viibryd as an option to trial.  Will wait to get her more stable prior to considering changing Pristiq as this may trigger further depressive symptoms.  No signs of self harm present at this time. I do feel she is safe to restart  outpatient.  She will follow-up in 7-10 days to see how she is doing.       Relevant Medications   buPROPion (WELLBUTRIN XL) 150 MG 24 hr tablet   desvenlafaxine (PRISTIQ) 100 MG 24 hr tablet   ALPRAZolam (XANAX) 0.5 MG tablet   Generalized anxiety disorder    Severe exacerbation with recent stop of Wellbutrin.  Will restart wellbutrin and titrate up to previous dose. Consider increase to 450mg  if previous dose not effective.  Once more stable, will consider transition from [place] to see if we can get better control. Prior to stopping wellbutrin her control was sub optimal.  Discussed recommendation to take time off of work to allow medication adjustments and recovery time. I do feel her current work situation is increasing her anxiety and depression symptoms.  Letter for 3 week leave sent to patient via [facility]. She will send FMLA paperwork if required.  F/U in 7-10 days      Relevant Medications   buPROPion (WELLBUTRIN XL) 150 MG 24 hr tablet   desvenlafaxine (PRISTIQ) 100 MG 24 hr tablet   ALPRAZolam (XANAX) 0.5 MG tablet   Estrogen deficiency    Refill of medications today      Relevant Medications   estradiol (ESTRACE) 0.1 MG/GM vaginal cream   estradiol (VIVELLE-DOT) 0.1 MG/24HR patch   Other headache syndrome    Refill of medications today      Relevant Medications   buPROPion (WELLBUTRIN XL) 150 MG 24 hr tablet   desvenlafaxine (PRISTIQ) 100 MG 24 hr tablet   propranolol (INDERAL) 20 MG tablet   tiZANidine (ZANAFLEX) 2 MG tablet    orders and follow up as documented in EMR I discussed the assessment and treatment plan with the patient. The patient was provided an opportunity to ask questions and all were answered. The patient agreed with the plan and demonstrated an understanding of the instructions.   The patient was advised to call back or seek an in-person evaluation if the symptoms worsen or if the condition fails to improve as anticipated.  Follow-Up: in a few  days  I provided 35 minutes of non-face-to-face interaction with this non face-to-face encounter including intake, same-day documentation, and chart review.   Orma Render, NP , DNP, AGNP-c [place] at [facility] [phone] (fax)

## 2021-08-07 NOTE — Assessment & Plan Note (Signed)
Refill of medications today

## 2021-08-07 NOTE — Assessment & Plan Note (Signed)
Recent self stop of wellbutrin when prescription ran out causing severe relapse of symptoms. Will restart Wellbutrin at 150mg  with dose increase to 300mg  in 3 days. Consider 450mg  dosing after 2 weeks if this is not effective.  May also consider transition from [place] to alternative medication that may be more beneficial for her. She has tried multiple medications without success in the past. Consider Viibryd as an option to trial.  Will wait to get her more stable prior to considering changing Pristiq as this may trigger further depressive symptoms.  No signs of self harm present at this time. I do feel she is safe to restart outpatient.  She will follow-up in 7-10 days to see how she is doing.

## 2021-08-08 ENCOUNTER — Other Ambulatory Visit (HOSPITAL_COMMUNITY): Payer: Self-pay

## 2021-08-18 ENCOUNTER — Encounter (HOSPITAL_BASED_OUTPATIENT_CLINIC_OR_DEPARTMENT_OTHER): Payer: Self-pay | Admitting: Nurse Practitioner

## 2021-08-19 ENCOUNTER — Other Ambulatory Visit (HOSPITAL_COMMUNITY): Payer: Self-pay

## 2021-08-19 ENCOUNTER — Other Ambulatory Visit (HOSPITAL_BASED_OUTPATIENT_CLINIC_OR_DEPARTMENT_OTHER): Payer: Self-pay | Admitting: Nurse Practitioner

## 2021-08-19 DIAGNOSIS — F411 Generalized anxiety disorder: Secondary | ICD-10-CM

## 2021-08-19 DIAGNOSIS — F332 Major depressive disorder, recurrent severe without psychotic features: Secondary | ICD-10-CM

## 2021-08-19 MED ORDER — BUPROPION HCL ER (XL) 150 MG PO TB24
ORAL_TABLET | ORAL | 3 refills | Status: DC
Start: 2021-08-19 — End: 2022-01-16
  Filled 2021-08-19 – 2021-09-12 (×3): qty 270, 90d supply, fill #0
  Filled 2021-12-12: qty 270, 90d supply, fill #1

## 2021-08-19 MED ORDER — GABAPENTIN 250 MG/5ML PO SOLN
ORAL | 0 refills | Status: DC
  Filled 2021-08-19: qty 470, 40d supply, fill #0

## 2021-08-20 ENCOUNTER — Other Ambulatory Visit (HOSPITAL_COMMUNITY): Payer: Self-pay

## 2021-08-21 ENCOUNTER — Other Ambulatory Visit (HOSPITAL_COMMUNITY): Payer: Self-pay

## 2021-09-04 ENCOUNTER — Encounter (HOSPITAL_BASED_OUTPATIENT_CLINIC_OR_DEPARTMENT_OTHER): Payer: Self-pay | Admitting: Nurse Practitioner

## 2021-09-08 ENCOUNTER — Encounter (HOSPITAL_BASED_OUTPATIENT_CLINIC_OR_DEPARTMENT_OTHER): Payer: Self-pay | Admitting: Nurse Practitioner

## 2021-09-12 ENCOUNTER — Other Ambulatory Visit (HOSPITAL_COMMUNITY): Payer: Self-pay

## 2021-09-26 ENCOUNTER — Other Ambulatory Visit (HOSPITAL_BASED_OUTPATIENT_CLINIC_OR_DEPARTMENT_OTHER): Payer: Self-pay | Admitting: Nurse Practitioner

## 2021-09-26 ENCOUNTER — Other Ambulatory Visit (HOSPITAL_COMMUNITY): Payer: Self-pay

## 2021-09-26 DIAGNOSIS — L719 Rosacea, unspecified: Secondary | ICD-10-CM

## 2021-09-26 DIAGNOSIS — F411 Generalized anxiety disorder: Secondary | ICD-10-CM

## 2021-09-26 DIAGNOSIS — G43009 Migraine without aura, not intractable, without status migrainosus: Secondary | ICD-10-CM

## 2021-09-26 DIAGNOSIS — F332 Major depressive disorder, recurrent severe without psychotic features: Secondary | ICD-10-CM

## 2021-09-26 MED ORDER — GABAPENTIN 250 MG/5ML PO SOLN
ORAL | 6 refills | Status: DC
  Filled 2021-09-26: qty 200, 7d supply, fill #0
  Filled 2021-09-26: qty 700, 23d supply, fill #0
  Filled 2021-10-28: qty 900, 30d supply, fill #1
  Filled 2021-12-03: qty 900, 30d supply, fill #2

## 2021-09-26 MED ORDER — PROMETHAZINE HCL 12.5 MG PO TABS
12.5000 mg | ORAL_TABLET | ORAL | 11 refills | Status: DC | PRN
  Filled 2021-09-26: qty 60, 10d supply, fill #0

## 2021-09-26 MED ORDER — METRONIDAZOLE 0.75 % EX GEL
1.0000 "application " | Freq: Two times a day (BID) | CUTANEOUS | 11 refills | Status: DC
  Filled 2021-09-26: qty 45, 30d supply, fill #0

## 2021-09-30 ENCOUNTER — Other Ambulatory Visit (HOSPITAL_COMMUNITY): Payer: Self-pay

## 2021-10-10 ENCOUNTER — Other Ambulatory Visit (HOSPITAL_COMMUNITY): Payer: Self-pay

## 2021-10-17 ENCOUNTER — Other Ambulatory Visit (HOSPITAL_COMMUNITY): Payer: Self-pay

## 2021-10-24 ENCOUNTER — Other Ambulatory Visit (HOSPITAL_COMMUNITY): Payer: Self-pay

## 2021-10-30 ENCOUNTER — Other Ambulatory Visit (HOSPITAL_COMMUNITY): Payer: Self-pay

## 2021-11-10 ENCOUNTER — Other Ambulatory Visit (HOSPITAL_COMMUNITY): Payer: Self-pay

## 2021-11-10 ENCOUNTER — Other Ambulatory Visit (HOSPITAL_BASED_OUTPATIENT_CLINIC_OR_DEPARTMENT_OTHER): Payer: Self-pay | Admitting: Nurse Practitioner

## 2021-11-10 DIAGNOSIS — F411 Generalized anxiety disorder: Secondary | ICD-10-CM

## 2021-11-10 DIAGNOSIS — F339 Major depressive disorder, recurrent, unspecified: Secondary | ICD-10-CM

## 2021-11-10 MED ORDER — DESVENLAFAXINE SUCCINATE ER 100 MG PO TB24
100.0000 mg | ORAL_TABLET | Freq: Every day | ORAL | 0 refills | Status: DC
  Filled 2021-11-10: qty 30, 30d supply, fill #0

## 2021-11-18 ENCOUNTER — Other Ambulatory Visit (HOSPITAL_COMMUNITY): Payer: Self-pay

## 2021-12-03 ENCOUNTER — Other Ambulatory Visit (HOSPITAL_BASED_OUTPATIENT_CLINIC_OR_DEPARTMENT_OTHER): Payer: Self-pay | Admitting: Nurse Practitioner

## 2021-12-03 ENCOUNTER — Other Ambulatory Visit (HOSPITAL_COMMUNITY): Payer: Self-pay

## 2021-12-03 DIAGNOSIS — F332 Major depressive disorder, recurrent severe without psychotic features: Secondary | ICD-10-CM

## 2021-12-03 DIAGNOSIS — F411 Generalized anxiety disorder: Secondary | ICD-10-CM

## 2021-12-03 DIAGNOSIS — F339 Major depressive disorder, recurrent, unspecified: Secondary | ICD-10-CM

## 2021-12-03 MED ORDER — ALPRAZOLAM 0.5 MG PO TABS
0.5000 mg | ORAL_TABLET | Freq: Three times a day (TID) | ORAL | 0 refills | Status: DC | PRN
  Filled 2021-12-03: qty 90, 30d supply, fill #0

## 2021-12-03 MED ORDER — GABAPENTIN 250 MG/5ML PO SOLN
ORAL | 6 refills | Status: DC
  Filled 2021-12-03: qty 900, 30d supply, fill #0

## 2021-12-04 ENCOUNTER — Encounter (HOSPITAL_BASED_OUTPATIENT_CLINIC_OR_DEPARTMENT_OTHER): Payer: Self-pay

## 2021-12-04 ENCOUNTER — Other Ambulatory Visit (HOSPITAL_BASED_OUTPATIENT_CLINIC_OR_DEPARTMENT_OTHER): Payer: Self-pay | Admitting: Nurse Practitioner

## 2021-12-04 ENCOUNTER — Other Ambulatory Visit (HOSPITAL_COMMUNITY): Payer: Self-pay

## 2021-12-04 DIAGNOSIS — F411 Generalized anxiety disorder: Secondary | ICD-10-CM

## 2021-12-04 DIAGNOSIS — F339 Major depressive disorder, recurrent, unspecified: Secondary | ICD-10-CM

## 2021-12-04 DIAGNOSIS — G43009 Migraine without aura, not intractable, without status migrainosus: Secondary | ICD-10-CM

## 2021-12-04 MED ORDER — GABAPENTIN 300 MG PO CAPS
ORAL_CAPSULE | ORAL | 11 refills | Status: DC
  Filled 2021-12-04: qty 150, 30d supply, fill #0

## 2021-12-04 MED ORDER — GABAPENTIN 300 MG PO CAPS
ORAL_CAPSULE | ORAL | 3 refills | Status: DC
Start: 2021-12-04 — End: 2021-12-11
  Filled 2021-12-04 – 2021-12-11 (×2): qty 90, 18d supply, fill #0

## 2021-12-04 MED ORDER — GABAPENTIN 250 MG/5ML PO SOLN
ORAL | 6 refills | Status: DC
  Filled 2021-12-04: qty 900, 30d supply, fill #0

## 2021-12-04 NOTE — Telephone Encounter (Signed)
This encounter was created in error - please disregard.

## 2021-12-04 NOTE — Telephone Encounter (Signed)
Drug shortage

## 2021-12-11 ENCOUNTER — Other Ambulatory Visit: Payer: Self-pay

## 2021-12-11 ENCOUNTER — Telehealth (INDEPENDENT_AMBULATORY_CARE_PROVIDER_SITE_OTHER): Payer: 59 | Admitting: Nurse Practitioner

## 2021-12-11 ENCOUNTER — Encounter (HOSPITAL_BASED_OUTPATIENT_CLINIC_OR_DEPARTMENT_OTHER): Payer: Self-pay | Admitting: Nurse Practitioner

## 2021-12-11 ENCOUNTER — Encounter: Payer: Self-pay | Admitting: Family

## 2021-12-11 ENCOUNTER — Other Ambulatory Visit (HOSPITAL_COMMUNITY): Payer: Self-pay

## 2021-12-11 DIAGNOSIS — G43009 Migraine without aura, not intractable, without status migrainosus: Secondary | ICD-10-CM

## 2021-12-11 DIAGNOSIS — F339 Major depressive disorder, recurrent, unspecified: Secondary | ICD-10-CM

## 2021-12-11 DIAGNOSIS — G4489 Other headache syndrome: Secondary | ICD-10-CM

## 2021-12-11 DIAGNOSIS — Z800 Family history of malignant neoplasm of digestive organs: Secondary | ICD-10-CM | POA: Insufficient documentation

## 2021-12-11 DIAGNOSIS — F411 Generalized anxiety disorder: Secondary | ICD-10-CM | POA: Diagnosis not present

## 2021-12-11 MED ORDER — ALPRAZOLAM 0.5 MG PO TABS
0.5000 mg | ORAL_TABLET | Freq: Three times a day (TID) | ORAL | 3 refills | Status: DC | PRN
  Filled 2021-12-11: qty 90, 30d supply, fill #0

## 2021-12-11 MED ORDER — TIZANIDINE HCL 2 MG PO TABS
2.0000 mg | ORAL_TABLET | Freq: Four times a day (QID) | ORAL | 3 refills | Status: DC | PRN
  Filled 2021-12-11: qty 254, 64d supply, fill #0
  Filled 2021-12-11: qty 16, 4d supply, fill #0

## 2021-12-11 MED ORDER — GABAPENTIN 300 MG PO CAPS
ORAL_CAPSULE | ORAL | 3 refills | Status: DC
  Filled 2021-12-11 (×2): qty 450, 90d supply, fill #0

## 2021-12-11 NOTE — Patient Instructions (Signed)
Call the insurance company and ask them if they cover GeneSite testing. Your diagnosis

## 2021-12-11 NOTE — Assessment & Plan Note (Signed)
See Depression, recurrent note from same day.
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
## 2021-12-11 NOTE — Assessment & Plan Note (Addendum)
Chronic.  Severe. Severe depressive disorder significantly impacting patient's daily life and ability to function and perform activities of daily living including working, shopping, home care, leaving the home. She is currently on multidrug therapy and seeking counseling services for 90 minutes once a week however still experiencing worsening symptoms due to revelation of suppressed memories.  No alarm symptoms revealed today however her symptoms are significantly concerning to me. Adding additional stressors with financial concerns could have a serious negative impact on her overall mental wellbeing and recovery.  Will plan to contact [facility] to discuss treatment plans and options. Plan to restart gabapentin immediately. Patient will send long term disability paperwork through [employer] and we will work to [employer].  Will send order for Genesite testing to ensure that medication options are appropriate for patient. Discussed option of EMT with patient and recommend that she look into this. We will be happy to send referral to psychiatry for evaluation and recommendations for this.

## 2021-12-12 ENCOUNTER — Other Ambulatory Visit (HOSPITAL_COMMUNITY): Payer: Self-pay

## 2021-12-12 ENCOUNTER — Other Ambulatory Visit (HOSPITAL_BASED_OUTPATIENT_CLINIC_OR_DEPARTMENT_OTHER): Payer: Self-pay | Admitting: Nurse Practitioner

## 2021-12-12 DIAGNOSIS — F411 Generalized anxiety disorder: Secondary | ICD-10-CM

## 2021-12-12 DIAGNOSIS — F339 Major depressive disorder, recurrent, unspecified: Secondary | ICD-10-CM

## 2021-12-14 MED ORDER — DESVENLAFAXINE SUCCINATE ER 100 MG PO TB24
100.0000 mg | ORAL_TABLET | Freq: Every day | ORAL | 0 refills | Status: DC
  Filled 2021-12-14: qty 30, 30d supply, fill #0

## 2021-12-15 ENCOUNTER — Other Ambulatory Visit (HOSPITAL_COMMUNITY): Payer: Self-pay

## 2021-12-17 ENCOUNTER — Encounter (HOSPITAL_BASED_OUTPATIENT_CLINIC_OR_DEPARTMENT_OTHER): Payer: Self-pay | Admitting: Nurse Practitioner

## 2021-12-24 ENCOUNTER — Telehealth (HOSPITAL_BASED_OUTPATIENT_CLINIC_OR_DEPARTMENT_OTHER): Payer: Self-pay | Admitting: Nurse Practitioner

## 2021-12-24 DIAGNOSIS — F411 Generalized anxiety disorder: Secondary | ICD-10-CM

## 2021-12-24 DIAGNOSIS — F332 Major depressive disorder, recurrent severe without psychotic features: Secondary | ICD-10-CM

## 2021-12-24 DIAGNOSIS — F4000 Agoraphobia, unspecified: Secondary | ICD-10-CM

## 2021-12-24 DIAGNOSIS — F5101 Primary insomnia: Secondary | ICD-10-CM

## 2021-12-24 DIAGNOSIS — F4312 Post-traumatic stress disorder, chronic: Secondary | ICD-10-CM

## 2021-12-24 DIAGNOSIS — F449 Dissociative and conversion disorder, unspecified: Secondary | ICD-10-CM
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
## 2022-01-15 ENCOUNTER — Other Ambulatory Visit (HOSPITAL_COMMUNITY): Payer: Self-pay

## 2022-01-15 ENCOUNTER — Encounter (HOSPITAL_BASED_OUTPATIENT_CLINIC_OR_DEPARTMENT_OTHER): Payer: Self-pay | Admitting: Nurse Practitioner

## 2022-01-15 DIAGNOSIS — F339 Major depressive disorder, recurrent, unspecified: Secondary | ICD-10-CM

## 2022-01-15 DIAGNOSIS — E2839 Other primary ovarian failure: Secondary | ICD-10-CM

## 2022-01-15 DIAGNOSIS — L719 Rosacea, unspecified: Secondary | ICD-10-CM

## 2022-01-15 DIAGNOSIS — F411 Generalized anxiety disorder: Secondary | ICD-10-CM

## 2022-01-15 DIAGNOSIS — F5101 Primary insomnia: Secondary | ICD-10-CM

## 2022-01-15 DIAGNOSIS — G43009 Migraine without aura, not intractable, without status migrainosus: Secondary | ICD-10-CM

## 2022-01-15 DIAGNOSIS — G4489 Other headache syndrome: Secondary | ICD-10-CM

## 2022-01-15 DIAGNOSIS — F332 Major depressive disorder, recurrent severe without psychotic features: Secondary | ICD-10-CM

## 2022-01-16 ENCOUNTER — Encounter (HOSPITAL_BASED_OUTPATIENT_CLINIC_OR_DEPARTMENT_OTHER): Payer: Self-pay | Admitting: Nurse Practitioner

## 2022-01-16 MED ORDER — TIZANIDINE HCL 2 MG PO TABS: 2.0000 mg | ORAL_TABLET | Freq: Four times a day (QID) | ORAL | 3 refills | Status: DC | PRN

## 2022-01-16 MED ORDER — ESZOPICLONE 3 MG PO TABS: 3.0000 mg | ORAL_TABLET | Freq: Every day | ORAL | 3 refills | Status: DC

## 2022-01-16 MED ORDER — ALPRAZOLAM 0.5 MG PO TABS: 0.5000 mg | ORAL_TABLET | Freq: Three times a day (TID) | ORAL | 3 refills | Status: DC | PRN

## 2022-01-16 MED ORDER — DESVENLAFAXINE SUCCINATE ER 100 MG PO TB24: 100.0000 mg | ORAL_TABLET | Freq: Every day | ORAL | 0 refills | Status: DC

## 2022-01-16 MED ORDER — METRONIDAZOLE 0.75 % EX GEL: 1.0000 "application " | Freq: Two times a day (BID) | CUTANEOUS | 11 refills | Status: DC

## 2022-01-16 MED ORDER — GABAPENTIN 300 MG PO CAPS: ORAL_CAPSULE | ORAL | 3 refills | Status: DC

## 2022-01-16 MED ORDER — ESTRADIOL 0.1 MG/GM VA CREA: TOPICAL_CREAM | VAGINAL | 3 refills | Status: AC

## 2022-01-16 MED ORDER — PROMETHAZINE HCL 12.5 MG PO TABS: 12.5000 mg | ORAL_TABLET | ORAL | 11 refills | Status: DC | PRN

## 2022-01-16 MED ORDER — PROPRANOLOL HCL 20 MG PO TABS: 20.0000 mg | ORAL_TABLET | Freq: Three times a day (TID) | ORAL | 3 refills | Status: DC

## 2022-01-16 MED ORDER — ESTRADIOL 0.1 MG/24HR TD PTTW: 1.0000 | MEDICATED_PATCH | TRANSDERMAL | 3 refills | Status: DC

## 2022-01-16 MED ORDER — BUPROPION HCL ER (XL) 150 MG PO TB24: ORAL_TABLET | ORAL | 3 refills | Status: DC

## 2022-01-29 ENCOUNTER — Other Ambulatory Visit (HOSPITAL_BASED_OUTPATIENT_CLINIC_OR_DEPARTMENT_OTHER): Payer: Self-pay

## 2022-01-29 DIAGNOSIS — F411 Generalized anxiety disorder: Secondary | ICD-10-CM

## 2022-01-29 DIAGNOSIS — F339 Major depressive disorder, recurrent, unspecified: Secondary | ICD-10-CM

## 2022-01-29 MED ORDER — DESVENLAFAXINE SUCCINATE ER 100 MG PO TB24: 100.0000 mg | ORAL_TABLET | Freq: Every day | ORAL | 3 refills | Status: DC

## 2022-02-12 ENCOUNTER — Other Ambulatory Visit (HOSPITAL_BASED_OUTPATIENT_CLINIC_OR_DEPARTMENT_OTHER): Payer: Self-pay | Admitting: Nurse Practitioner

## 2022-02-12 DIAGNOSIS — F339 Major depressive disorder, recurrent, unspecified: Secondary | ICD-10-CM

## 2022-02-12 DIAGNOSIS — F411 Generalized anxiety disorder: Secondary | ICD-10-CM

## 2022-04-09 ENCOUNTER — Encounter (HOSPITAL_BASED_OUTPATIENT_CLINIC_OR_DEPARTMENT_OTHER): Payer: Self-pay | Admitting: Nurse Practitioner

## 2022-04-09 DIAGNOSIS — F4000 Agoraphobia, unspecified: Secondary | ICD-10-CM

## 2022-04-09 DIAGNOSIS — F449 Dissociative and conversion disorder, unspecified: Secondary | ICD-10-CM

## 2022-04-09 DIAGNOSIS — F5101 Primary insomnia: Secondary | ICD-10-CM

## 2022-04-09 DIAGNOSIS — F411 Generalized anxiety disorder: Secondary | ICD-10-CM

## 2022-04-09 DIAGNOSIS — F332 Major depressive disorder, recurrent severe without psychotic features: Secondary | ICD-10-CM

## 2022-04-09 DIAGNOSIS — F4312 Post-traumatic stress disorder, chronic: Secondary | ICD-10-CM

## 2022-04-09 DIAGNOSIS — F339 Major depressive disorder, recurrent, unspecified: Secondary | ICD-10-CM

## 2022-04-14 ENCOUNTER — Other Ambulatory Visit (HOSPITAL_BASED_OUTPATIENT_CLINIC_OR_DEPARTMENT_OTHER): Payer: Self-pay | Admitting: Nurse Practitioner

## 2022-04-17 ENCOUNTER — Encounter: Payer: Self-pay | Admitting: Family

## 2022-04-17 ENCOUNTER — Other Ambulatory Visit (HOSPITAL_BASED_OUTPATIENT_CLINIC_OR_DEPARTMENT_OTHER): Payer: Self-pay | Admitting: Nurse Practitioner

## 2022-04-17 ENCOUNTER — Other Ambulatory Visit (HOSPITAL_COMMUNITY): Payer: Self-pay

## 2022-04-17 DIAGNOSIS — F332 Major depressive disorder, recurrent severe without psychotic features: Secondary | ICD-10-CM

## 2022-04-17 DIAGNOSIS — F339 Major depressive disorder, recurrent, unspecified: Secondary | ICD-10-CM

## 2022-04-17 DIAGNOSIS — F4312 Post-traumatic stress disorder, chronic: Secondary | ICD-10-CM

## 2022-04-17 DIAGNOSIS — F449 Dissociative and conversion disorder, unspecified: Secondary | ICD-10-CM

## 2022-04-17 DIAGNOSIS — F4000 Agoraphobia, unspecified: Secondary | ICD-10-CM

## 2022-04-17 DIAGNOSIS — F411 Generalized anxiety disorder: Secondary | ICD-10-CM

## 2022-04-17 MED ORDER — CARIPRAZINE HCL 1.5 MG PO CAPS
1.5000 mg | ORAL_CAPSULE | Freq: Every day | ORAL | 2 refills | Status: DC
  Filled 2022-04-17: qty 30, 30d supply, fill #0

## 2022-05-29 ENCOUNTER — Other Ambulatory Visit (HOSPITAL_COMMUNITY): Payer: Self-pay

## 2022-05-29 MED ORDER — ZOLPIDEM TARTRATE 5 MG PO TABS
5.0000 mg | ORAL_TABLET | Freq: Every evening | ORAL | 1 refills | Status: DC | PRN
  Filled 2022-05-29: qty 20, 25d supply, fill #0
  Filled 2022-07-16: qty 10, 10d supply, fill #1

## 2022-06-01 ENCOUNTER — Other Ambulatory Visit (HOSPITAL_COMMUNITY): Payer: Self-pay

## 2022-07-16 ENCOUNTER — Other Ambulatory Visit (HOSPITAL_COMMUNITY): Payer: Self-pay

## 2022-07-16 ENCOUNTER — Encounter: Payer: Self-pay | Admitting: Family

## 2022-07-21 ENCOUNTER — Encounter (HOSPITAL_BASED_OUTPATIENT_CLINIC_OR_DEPARTMENT_OTHER): Payer: Self-pay | Admitting: Nurse Practitioner

## 2022-07-23 ENCOUNTER — Other Ambulatory Visit (HOSPITAL_BASED_OUTPATIENT_CLINIC_OR_DEPARTMENT_OTHER): Payer: Self-pay | Admitting: Nurse Practitioner

## 2022-07-23 ENCOUNTER — Other Ambulatory Visit (HOSPITAL_COMMUNITY): Payer: Self-pay

## 2022-07-23 DIAGNOSIS — F4312 Post-traumatic stress disorder, chronic: Secondary | ICD-10-CM

## 2022-07-23 DIAGNOSIS — F332 Major depressive disorder, recurrent severe without psychotic features: Secondary | ICD-10-CM

## 2022-07-23 DIAGNOSIS — F449 Dissociative and conversion disorder, unspecified: Secondary | ICD-10-CM

## 2022-07-23 DIAGNOSIS — F411 Generalized anxiety disorder: Secondary | ICD-10-CM

## 2022-07-23 DIAGNOSIS — F4000 Agoraphobia, unspecified: Secondary | ICD-10-CM

## 2022-07-23 DIAGNOSIS — F5101 Primary insomnia: Secondary | ICD-10-CM

## 2022-07-23 DIAGNOSIS — F339 Major depressive disorder, recurrent, unspecified: Secondary | ICD-10-CM

## 2022-07-23 MED ORDER — ZOLPIDEM TARTRATE 10 MG PO TABS
10.0000 mg | ORAL_TABLET | Freq: Every evening | ORAL | 2 refills | Status: DC | PRN
  Filled 2022-07-23 (×2): qty 20, 25d supply, fill #0
  Filled 2022-08-17: qty 10, 10d supply, fill #1
  Filled 2022-08-28: qty 30, 30d supply, fill #2
  Filled 2022-09-26: qty 30, 30d supply, fill #3

## 2022-08-17 ENCOUNTER — Encounter: Payer: Self-pay | Admitting: Family

## 2022-08-17 ENCOUNTER — Other Ambulatory Visit (HOSPITAL_COMMUNITY): Payer: Self-pay

## 2022-08-22 ENCOUNTER — Other Ambulatory Visit (HOSPITAL_BASED_OUTPATIENT_CLINIC_OR_DEPARTMENT_OTHER): Payer: Self-pay | Admitting: Nurse Practitioner

## 2022-08-22 DIAGNOSIS — F339 Major depressive disorder, recurrent, unspecified: Secondary | ICD-10-CM

## 2022-08-22 DIAGNOSIS — F411 Generalized anxiety disorder: Secondary | ICD-10-CM

## 2022-08-28 ENCOUNTER — Other Ambulatory Visit (HOSPITAL_COMMUNITY): Payer: Self-pay

## 2022-08-28 ENCOUNTER — Encounter: Payer: Self-pay | Admitting: Family

## 2022-08-30 ENCOUNTER — Encounter (HOSPITAL_BASED_OUTPATIENT_CLINIC_OR_DEPARTMENT_OTHER): Payer: Self-pay | Admitting: Nurse Practitioner

## 2022-09-16 ENCOUNTER — Other Ambulatory Visit (HOSPITAL_BASED_OUTPATIENT_CLINIC_OR_DEPARTMENT_OTHER): Payer: Self-pay | Admitting: Nurse Practitioner

## 2022-09-16 DIAGNOSIS — E2839 Other primary ovarian failure: Secondary | ICD-10-CM

## 2022-09-26 ENCOUNTER — Other Ambulatory Visit (HOSPITAL_COMMUNITY): Payer: Self-pay

## 2022-09-26 ENCOUNTER — Encounter: Payer: Self-pay | Admitting: Family

## 2022-09-29 ENCOUNTER — Other Ambulatory Visit (HOSPITAL_COMMUNITY): Payer: Self-pay

## 2022-10-01 ENCOUNTER — Encounter: Payer: Self-pay | Admitting: Family

## 2022-10-03 ENCOUNTER — Encounter: Payer: Self-pay | Admitting: Family

## 2022-10-13 ENCOUNTER — Encounter: Payer: Self-pay | Admitting: Nurse Practitioner

## 2022-10-13 ENCOUNTER — Telehealth (INDEPENDENT_AMBULATORY_CARE_PROVIDER_SITE_OTHER): Payer: BC Managed Care – PPO | Admitting: Nurse Practitioner

## 2022-10-13 DIAGNOSIS — F332 Major depressive disorder, recurrent severe without psychotic features: Secondary | ICD-10-CM

## 2022-10-13 DIAGNOSIS — F4312 Post-traumatic stress disorder, chronic: Secondary | ICD-10-CM

## 2022-10-13 DIAGNOSIS — G43009 Migraine without aura, not intractable, without status migrainosus: Secondary | ICD-10-CM

## 2022-10-13 DIAGNOSIS — F449 Dissociative and conversion disorder, unspecified: Secondary | ICD-10-CM

## 2022-10-13 DIAGNOSIS — F5101 Primary insomnia: Secondary | ICD-10-CM | POA: Diagnosis not present

## 2022-10-13 DIAGNOSIS — F4000 Agoraphobia, unspecified: Secondary | ICD-10-CM | POA: Diagnosis not present

## 2022-10-13 DIAGNOSIS — F411 Generalized anxiety disorder: Secondary | ICD-10-CM | POA: Diagnosis not present

## 2022-10-13 DIAGNOSIS — F339 Major depressive disorder, recurrent, unspecified: Secondary | ICD-10-CM

## 2022-10-13 DIAGNOSIS — G4489 Other headache syndrome: Secondary | ICD-10-CM

## 2022-10-13 DIAGNOSIS — L719 Rosacea, unspecified: Secondary | ICD-10-CM

## 2022-10-13 MED ORDER — BUPROPION HCL ER (XL) 150 MG PO TB24: ORAL_TABLET | ORAL | 3 refills | Status: DC

## 2022-10-13 MED ORDER — ZOLPIDEM TARTRATE 10 MG PO TABS: 10.0000 mg | ORAL_TABLET | Freq: Every evening | ORAL | 2 refills | Status: DC | PRN

## 2022-10-13 MED ORDER — ALPRAZOLAM 0.5 MG PO TABS: 0.5000 mg | ORAL_TABLET | Freq: Three times a day (TID) | ORAL | 2 refills | Status: DC | PRN

## 2022-10-13 MED ORDER — DESVENLAFAXINE SUCCINATE ER 100 MG PO TB24: 100.0000 mg | ORAL_TABLET | Freq: Every day | ORAL | 11 refills | Status: DC

## 2022-10-13 MED ORDER — CARIPRAZINE HCL 1.5 MG PO CAPS: 1.5000 mg | ORAL_CAPSULE | Freq: Every day | ORAL | 2 refills | Status: DC

## 2022-10-13 MED ORDER — GABAPENTIN 300 MG PO CAPS: ORAL_CAPSULE | ORAL | 3 refills | Status: DC

## 2022-10-13 MED ORDER — PROPRANOLOL HCL 20 MG PO TABS: 20.0000 mg | ORAL_TABLET | Freq: Three times a day (TID) | ORAL | 3 refills | Status: AC

## 2022-10-13 MED ORDER — METRONIDAZOLE 0.75 % EX GEL: 1.0000 | Freq: Two times a day (BID) | CUTANEOUS | 11 refills | Status: AC
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
## 2022-10-14 ENCOUNTER — Encounter: Payer: Self-pay | Admitting: Nurse Practitioner

## 2022-10-14 NOTE — Assessment & Plan Note (Signed)
Her depression does appear to be improving with time however she is quite tearful throughout the interview today.  I am reassured that she feels ready to return to the workplace and discussed with her today the benefits associated with the delay in restarting her job.  I am hopeful that she is able to focus on the positives as opposed to negatives primarily.  I do feel that this will significantly help her mental health state.  I also feel that getting started back with her counselor we will be very beneficial for her as she was making excellent strides previously when working with her.  Fortunately it does not appear that she has had any setbacks which is fantastic!  I have encouraged her to continue to think positively about her new position get herself excited about starting in her new role.  Refills have been provided today.  We will need to get labs once she is back on her regular insurance.  No alarm symptoms present at this time.

## 2022-10-14 NOTE — Assessment & Plan Note (Signed)
Significant improvement in migraine headache symptoms with improved control of her depression and anxiety.  No alarm symptoms present today.  No changes to medication.

## 2022-10-14 NOTE — Assessment & Plan Note (Signed)
Chronic.  She is doing quite well on Ambien for sleep.  No alarm symptoms are present at this time.  Refills have been provided.  PDMP reviewed today.

## 2022-10-14 NOTE — Assessment & Plan Note (Signed)
She is still experiencing some significant social anxiety symptoms however, I am encouraged that she will be returning to work in the near future and she is looking forward to this. This is significant progress.  I do feel that this job will suit her very well as she will not have complete patient facing activities which may be initially difficult for her in the transition.  I have encouraged her to start leaving the house for short periods to get used to being out of the home again.  I do feel that she will benefit from restarting therapy and working with Chelle again. Will refill her medications today.  No alarm symptoms present at this time.  Will plan to follow-up in approximate 3 3 months to see how she is doing once she has started back on her new position.

## 2022-10-30 ENCOUNTER — Encounter: Payer: Self-pay | Admitting: Internal Medicine

## 2022-12-21 ENCOUNTER — Other Ambulatory Visit (HOSPITAL_BASED_OUTPATIENT_CLINIC_OR_DEPARTMENT_OTHER): Payer: Self-pay | Admitting: Nurse Practitioner

## 2022-12-21 DIAGNOSIS — E2839 Other primary ovarian failure: Secondary | ICD-10-CM

## 2022-12-22 ENCOUNTER — Encounter: Payer: Self-pay | Admitting: Nurse Practitioner

## 2022-12-22 DIAGNOSIS — F449 Dissociative and conversion disorder, unspecified: Secondary | ICD-10-CM

## 2022-12-22 DIAGNOSIS — F411 Generalized anxiety disorder: Secondary | ICD-10-CM

## 2022-12-22 DIAGNOSIS — F4000 Agoraphobia, unspecified: Secondary | ICD-10-CM

## 2022-12-22 DIAGNOSIS — F339 Major depressive disorder, recurrent, unspecified: Secondary | ICD-10-CM

## 2022-12-22 DIAGNOSIS — F332 Major depressive disorder, recurrent severe without psychotic features: Secondary | ICD-10-CM

## 2022-12-22 DIAGNOSIS — F4312 Post-traumatic stress disorder, chronic: Secondary | ICD-10-CM

## 2022-12-23 MED ORDER — CARIPRAZINE HCL 3 MG PO CAPS: 3.0000 mg | ORAL_CAPSULE | Freq: Every day | ORAL | 3 refills | Status: DC

## 2022-12-23 MED ORDER — ALPRAZOLAM 0.5 MG PO TABS: 0.5000 mg | ORAL_TABLET | Freq: Three times a day (TID) | ORAL | 2 refills | Status: DC | PRN

## 2023-01-17 ENCOUNTER — Other Ambulatory Visit: Payer: Self-pay | Admitting: Nurse Practitioner

## 2023-01-17 DIAGNOSIS — G4489 Other headache syndrome: Secondary | ICD-10-CM

## 2023-01-17 DIAGNOSIS — F339 Major depressive disorder, recurrent, unspecified: Secondary | ICD-10-CM

## 2023-01-17 DIAGNOSIS — F332 Major depressive disorder, recurrent severe without psychotic features: Secondary | ICD-10-CM

## 2023-01-17 DIAGNOSIS — F411 Generalized anxiety disorder: Secondary | ICD-10-CM

## 2023-01-17 DIAGNOSIS — F4000 Agoraphobia, unspecified: Secondary | ICD-10-CM

## 2023-01-17 DIAGNOSIS — F449 Dissociative and conversion disorder, unspecified: Secondary | ICD-10-CM

## 2023-01-17 DIAGNOSIS — F5101 Primary insomnia: Secondary | ICD-10-CM

## 2023-01-17 DIAGNOSIS — F4312 Post-traumatic stress disorder, chronic: Secondary | ICD-10-CM

## 2023-04-16 ENCOUNTER — Encounter: Payer: Self-pay | Admitting: Nurse Practitioner

## 2023-04-16 DIAGNOSIS — F4000 Agoraphobia, unspecified: Secondary | ICD-10-CM

## 2023-04-16 DIAGNOSIS — F332 Major depressive disorder, recurrent severe without psychotic features: Secondary | ICD-10-CM

## 2023-04-16 DIAGNOSIS — F4312 Post-traumatic stress disorder, chronic: Secondary | ICD-10-CM

## 2023-04-16 DIAGNOSIS — F411 Generalized anxiety disorder: Secondary | ICD-10-CM

## 2023-04-16 DIAGNOSIS — F339 Major depressive disorder, recurrent, unspecified: Secondary | ICD-10-CM

## 2023-04-16 DIAGNOSIS — F449 Dissociative and conversion disorder, unspecified: Secondary | ICD-10-CM

## 2023-04-16 DIAGNOSIS — F5101 Primary insomnia: Secondary | ICD-10-CM

## 2023-04-21 ENCOUNTER — Other Ambulatory Visit: Payer: Self-pay | Admitting: Nurse Practitioner

## 2023-04-21 DIAGNOSIS — F4312 Post-traumatic stress disorder, chronic: Secondary | ICD-10-CM

## 2023-04-21 DIAGNOSIS — F332 Major depressive disorder, recurrent severe without psychotic features: Secondary | ICD-10-CM

## 2023-04-21 DIAGNOSIS — F411 Generalized anxiety disorder: Secondary | ICD-10-CM

## 2023-04-21 DIAGNOSIS — F5101 Primary insomnia: Secondary | ICD-10-CM

## 2023-04-21 DIAGNOSIS — F339 Major depressive disorder, recurrent, unspecified: Secondary | ICD-10-CM

## 2023-04-21 DIAGNOSIS — F449 Dissociative and conversion disorder, unspecified: Secondary | ICD-10-CM

## 2023-04-21 DIAGNOSIS — F4000 Agoraphobia, unspecified: Secondary | ICD-10-CM

## 2023-04-21 MED ORDER — ALPRAZOLAM 0.5 MG PO TABS: 0.5000 mg | ORAL_TABLET | Freq: Three times a day (TID) | ORAL | 2 refills | Status: DC | PRN

## 2023-04-21 MED ORDER — ZOLPIDEM TARTRATE 10 MG PO TABS: 10.0000 mg | ORAL_TABLET | Freq: Every evening | ORAL | 2 refills | Status: DC | PRN

## 2023-04-27 LAB — HM MAMMOGRAPHY

## 2023-05-04 ENCOUNTER — Encounter: Payer: Self-pay | Admitting: Nurse Practitioner

## 2023-07-18 ENCOUNTER — Other Ambulatory Visit: Payer: Self-pay | Admitting: Nurse Practitioner

## 2023-07-18 DIAGNOSIS — F4000 Agoraphobia, unspecified: Secondary | ICD-10-CM

## 2023-07-18 DIAGNOSIS — F5101 Primary insomnia: Secondary | ICD-10-CM

## 2023-07-18 DIAGNOSIS — F411 Generalized anxiety disorder: Secondary | ICD-10-CM

## 2023-07-18 DIAGNOSIS — F332 Major depressive disorder, recurrent severe without psychotic features: Secondary | ICD-10-CM

## 2023-07-18 DIAGNOSIS — F4312 Post-traumatic stress disorder, chronic: Secondary | ICD-10-CM

## 2023-07-18 DIAGNOSIS — F449 Dissociative and conversion disorder, unspecified: Secondary | ICD-10-CM

## 2023-07-18 DIAGNOSIS — F339 Major depressive disorder, recurrent, unspecified: Secondary | ICD-10-CM

## 2023-07-19 NOTE — Telephone Encounter (Signed)
Last apt 10/13/22

## 2023-08-26 ENCOUNTER — Encounter: Payer: Self-pay | Admitting: Nurse Practitioner

## 2023-09-03 ENCOUNTER — Telehealth (INDEPENDENT_AMBULATORY_CARE_PROVIDER_SITE_OTHER): Payer: BC Managed Care – PPO | Admitting: Nurse Practitioner

## 2023-09-03 ENCOUNTER — Encounter: Payer: Self-pay | Admitting: Nurse Practitioner

## 2023-09-03 VITALS — Wt 310.0 lb

## 2023-09-03 DIAGNOSIS — E8940 Asymptomatic postprocedural ovarian failure: Secondary | ICD-10-CM

## 2023-09-03 DIAGNOSIS — D508 Other iron deficiency anemias: Secondary | ICD-10-CM

## 2023-09-03 DIAGNOSIS — Z79890 Hormone replacement therapy (postmenopausal): Secondary | ICD-10-CM

## 2023-09-03 DIAGNOSIS — F4000 Agoraphobia, unspecified: Secondary | ICD-10-CM

## 2023-09-03 DIAGNOSIS — Z9884 Bariatric surgery status: Secondary | ICD-10-CM

## 2023-09-03 DIAGNOSIS — F4312 Post-traumatic stress disorder, chronic: Secondary | ICD-10-CM | POA: Diagnosis not present

## 2023-09-03 DIAGNOSIS — Z6841 Body Mass Index (BMI) 40.0 and over, adult: Secondary | ICD-10-CM

## 2023-09-03 DIAGNOSIS — F449 Dissociative and conversion disorder, unspecified: Secondary | ICD-10-CM

## 2023-09-03 DIAGNOSIS — F50812 Binge eating disorder, severe: Secondary | ICD-10-CM

## 2023-09-03 DIAGNOSIS — F332 Major depressive disorder, recurrent severe without psychotic features: Secondary | ICD-10-CM | POA: Diagnosis not present

## 2023-09-03 DIAGNOSIS — E2839 Other primary ovarian failure: Secondary | ICD-10-CM

## 2023-09-03 DIAGNOSIS — F5101 Primary insomnia: Secondary | ICD-10-CM

## 2023-09-03 DIAGNOSIS — R7989 Other specified abnormal findings of blood chemistry: Secondary | ICD-10-CM

## 2023-09-03 DIAGNOSIS — E215 Disorder of parathyroid gland, unspecified: Secondary | ICD-10-CM

## 2023-09-03 MED ORDER — LISDEXAMFETAMINE DIMESYLATE 20 MG PO CAPS: 20.0000 mg | ORAL_CAPSULE | Freq: Every day | ORAL | 0 refills | Status: DC

## 2023-09-03 NOTE — Progress Notes (Signed)
Virtual Visit Encounter mychart visit.   I connected with  Judith Holland on 09/13/23 at 11:15 AM EST by secure video and audio telemedicine application. I verified that I am speaking with the correct person using two identifiers.   I introduced myself as a Publishing rights manager with the practice. The limitations of evaluation and management by telemedicine discussed with the patient and the availability of in person appointments. The patient expressed verbal understanding and consent to proceed.  Participating parties in this visit include: Myself and patient  The patient is: Patient Location: Home I am: Provider Location: Office/Clinic Subjective:    CC and HPI: Judith Holland is a 57 y.o. year old female presenting for follow up of mood.  History of Present Illness Judith Holland, with a history of depression and binge eating, reports a persistent low mood despite ongoing therapy and medication. She recently acknowledged to her new therapist, Judith Holland, that she has been binge eating when alone, a behavior she had not previously admitted to. In response to this, her dietitian, Judith Holland, recommended increasing her regular food intake to prevent feelings of deprivation that may trigger binges. She also discussed the patient's inadequate protein intake through an email sent to me earlier.   The patient's sleep is disturbed, often restless with vivid dreams, and she frequently wakes up Judith Holland in the morning. She takes Ambien to help initiate sleep and occasionally uses half a Xanax to return to sleep after waking Judith Holland. Despite this, she often feels tired during the day and usually takes a nap. She acknowledges that this could be a form of avoidance, but also feels physically tired.  The patient has a history of gastric bypass surgery, which she believes may be affecting the absorption of her medications. She is currently on a significant amount of medication but does not feel as well as she believes she should.  She has previously had low ferritin levels requiring IV iron supplementation, but had an allergic reaction to [facility], causing airway irritation. She now receives a different form of IV iron in the hospital, the name of which she does not recall.  The patient has not had screening labs for a couple of years and is open to having these done. She has previously had low vitamin D levels and is unsure if her current supplement is sufficient. She also requested a thyroid test to rule out any issues there. She is open to trying new medications or treatments, including Vyvanse for impulse control and potentially Spravato for treatment-resistant depression.  She mentions that her therapist suggested considering a medication, such as Vyvanse, for adjunctive treatment of her depressive symptoms to improve energy levels. She is interested in considering this.   Past medical history, Surgical history, Family history not pertinant except as noted below, Social history, Allergies, and medications have been entered into the medical record, reviewed, and corrections made.   Review of Systems:  All review of systems negative except what is listed in the HPI  Objective:    Alert and oriented x 4 Tearful, but calm Speaking in clear sentences with no shortness of breath. No distress.  Impression and Recommendations:    Problem List Items Addressed This Visit     Iron deficiency anemia secondary to inadequate dietary iron intake    Gastric bypass surgery with potential absorption issues affecting medication efficacy. Previous low ferritin levels requiring IV iron supplementation. Allergy to [facility] causing airway swelling and irritation. Discussed alternative IV iron supplementation in a hospital setting. 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    Repeat labs      Relevant Orders   TSH + free T4   Hemoglobin A1c   Vitamin B12   CBC with Differential/Platelet   Comprehensive metabolic panel   Iron, TIBC and Ferritin Panel   Lipid panel   VITAMIN D 25 Hydroxy (Vit-D Deficiency, Fractures)   Estradiol   Ambulatory referral to Psychiatry   Status post gastric bypass for obesity 2011    Monitor labs      Relevant Medications   lisdexamfetamine (VYVANSE) 20 MG capsule   Other Relevant Orders   TSH + free T4   Hemoglobin A1c   Vitamin B12   CBC with Differential/Platelet   Comprehensive metabolic panel   Iron, TIBC and Ferritin Panel   Lipid panel   VITAMIN D 25 Hydroxy (Vit-D Deficiency, Fractures)   Estradiol   Ambulatory referral to Psychiatry   Parathyroid abnormality (HCC)    Repeat labs      Relevant Orders   TSH + free T4   Hemoglobin A1c   Vitamin B12   CBC with Differential/Platelet   Comprehensive metabolic panel   Iron, TIBC and Ferritin Panel   Lipid panel   VITAMIN D 25 Hydroxy (Vit-D Deficiency, Fractures)   Estradiol   Ambulatory referral to Psychiatry   Abnormal liver function tests    Repeat labs ordered.       Relevant Orders   TSH + free T4   Hemoglobin A1c   Vitamin B12   CBC with Differential/Platelet   Comprehensive metabolic panel   Iron, TIBC and Ferritin Panel   Lipid panel   VITAMIN D 25 Hydroxy (Vit-D Deficiency, Fractures)   Estradiol   Ambulatory referral to Psychiatry   Estrogen deficiency    Repeat labs      Relevant Orders   TSH + free T4   Hemoglobin A1c   Vitamin B12   CBC with Differential/Platelet   Comprehensive metabolic panel   Iron, TIBC and Ferritin Panel   Lipid panel   VITAMIN D 25 Hydroxy  (Vit-D Deficiency, Fractures)   Estradiol   Ambulatory referral to Psychiatry   Dissociative episodes    See depression      Relevant Orders   TSH + free T4   Hemoglobin A1c   Vitamin B12   CBC with Differential/Platelet   Comprehensive metabolic panel   Iron, TIBC and Ferritin Panel   Lipid panel   VITAMIN D 25 Hydroxy (Vit-D Deficiency, Fractures)   Estradiol   Ambulatory referral to Psychiatry   Chronic post-traumatic stress disorder (PTSD)    See depression      Relevant Orders   TSH + free T4   Hemoglobin A1c   Vitamin B12   CBC with Differential/Platelet   Comprehensive metabolic panel   Iron, TIBC and Ferritin Panel   Lipid panel   VITAMIN D 25 Hydroxy (Vit-D Deficiency, Fractures)   Estradiol   Ambulatory referral to Psychiatry   Severe episode of recurrent major depressive disorder,  without psychotic features (HCC)    Chronic depression with worsening mood despite current medication. Treatment-resistant depression with increased fatigue, daytime napping, and restless sleep. Ongoing therapy with Lysle Rubens. Discussed potential use of Vyvanse for impulse control and Spravato (esketamine) for treatment-resistant depression, administered nasally in a controlled setting by trained psychiatrists, showing significant improvements in treatment-resistant cases. - Prescribe Vyvanse, take first thing in the morning - Refer to psychiatrist for Spravato (esketamine) evaluation - Continue therapy with Lysle Rubens, next appointment on September 14, 2023      Relevant Medications   lisdexamfetamine (VYVANSE) 20 MG capsule   Other Relevant Orders   TSH + free T4   Hemoglobin A1c   Vitamin B12   CBC with Differential/Platelet   Comprehensive metabolic panel   Iron, TIBC and Ferritin Panel   Lipid panel   VITAMIN D 25 Hydroxy (Vit-D Deficiency, Fractures)   Estradiol   Ambulatory referral to Psychiatry   Agoraphobia    See depression      Relevant Orders   TSH + free  T4   Hemoglobin A1c   Vitamin B12   CBC with Differential/Platelet   Comprehensive metabolic panel   Iron, TIBC and Ferritin Panel   Lipid panel   VITAMIN D 25 Hydroxy (Vit-D Deficiency, Fractures)   Estradiol   Ambulatory referral to Psychiatry   Severe binge-eating disorder    Binge eating when home alone, previously unaddressed. Current dietary management with dietician Clydene Fake includes increasing regular food intake to prevent binging and ensuring adequate protein intake. - Continue dietary management with Judith Holland next appointment on September 10, 2023      Relevant Medications   lisdexamfetamine (VYVANSE) 20 MG capsule   Other Relevant Orders   TSH + free T4   Hemoglobin A1c   Vitamin B12   CBC with Differential/Platelet   Comprehensive metabolic panel   Iron, TIBC and Ferritin Panel   Lipid panel   VITAMIN D 25 Hydroxy (Vit-D Deficiency, Fractures)   Estradiol   Ambulatory referral to Psychiatry   Primary insomnia - Primary   Relevant Orders   TSH + free T4   Hemoglobin A1c   Vitamin B12   CBC with Differential/Platelet   Comprehensive metabolic panel   Iron, TIBC and Ferritin Panel   Lipid panel   VITAMIN D 25 Hydroxy (Vit-D Deficiency, Fractures)   Estradiol   Ambulatory referral to Psychiatry   Other Visit Diagnoses     Body mass index (BMI) of 40.1-44.9 in adult [facility])   (Chronic)         orders and follow up as documented in EMR I discussed the assessment and treatment plan with the patient. The patient was provided an opportunity to ask questions and all were answered. The patient agreed with the plan and demonstrated an understanding of the instructions.   The patient was advised to call back or seek an in-person evaluation if the symptoms worsen or if the condition fails to improve as anticipated.  Follow-Up: in 3 months  I provided 29 minutes of non-face-to-face interaction with this non face-to-face encounter including intake, same-day  documentation, and chart review.   Tollie Eth, NP , DNP, AGNP-c [facility]

## 2023-09-07 ENCOUNTER — Other Ambulatory Visit: Payer: Self-pay | Admitting: Nurse Practitioner

## 2023-09-07 ENCOUNTER — Other Ambulatory Visit: Payer: BC Managed Care – PPO

## 2023-09-07 DIAGNOSIS — E559 Vitamin D deficiency, unspecified: Secondary | ICD-10-CM

## 2023-09-07 DIAGNOSIS — Z130 Encounter for screening for diseases of the blood and blood-forming organs and certain disorders involving the immune mechanism: Secondary | ICD-10-CM

## 2023-09-07 DIAGNOSIS — Z13228 Encounter for screening for other metabolic disorders: Secondary | ICD-10-CM

## 2023-09-07 DIAGNOSIS — R7303 Prediabetes: Secondary | ICD-10-CM

## 2023-09-13 ENCOUNTER — Encounter: Payer: Self-pay | Admitting: Nurse Practitioner

## 2023-09-13 DIAGNOSIS — F50812 Binge eating disorder, severe: Secondary | ICD-10-CM | POA: Insufficient documentation

## 2023-09-13 NOTE — Assessment & Plan Note (Signed)
See depression

## 2023-09-13 NOTE — Assessment & Plan Note (Signed)
Monitor labs

## 2023-09-13 NOTE — Assessment & Plan Note (Addendum)
Binge eating when home alone, previously unaddressed. Current dietary management with dietician Clydene Fake includes increasing regular food intake to prevent binging and ensuring adequate protein intake. - Continue dietary management with Mervyn Gay next appointment on September 10, 2023

## 2023-09-13 NOTE — Assessment & Plan Note (Signed)
Repeat labs:

## 2023-09-13 NOTE — Assessment & Plan Note (Signed)
See depression

## 2023-09-13 NOTE — Assessment & Plan Note (Signed)
Chronic depression with worsening mood despite current medication. Treatment-resistant depression with increased fatigue, daytime napping, and restless sleep. Ongoing therapy with Lysle Rubens. Discussed potential use of Vyvanse for impulse control and Spravato (esketamine) for treatment-resistant depression, administered nasally in a controlled setting by trained psychiatrists, showing significant improvements in treatment-resistant cases. - Prescribe Vyvanse, take first thing in the morning - Refer to psychiatrist for Spravato (esketamine) evaluation - Continue therapy with Lysle Rubens, next appointment on September 14, 2023

## 2023-09-13 NOTE — Assessment & Plan Note (Signed)
Gastric bypass surgery with potential absorption issues affecting medication efficacy. Previous low ferritin levels requiring IV iron supplementation. Allergy to [facility] causing airway swelling and irritation. Discussed alternative IV iron supplementation in a hospital setting. - Monitor for deficiencies with recommended lab tests - Avoid Ferraheme; use alternative IV iron supplementation in hospital setting

## 2023-09-13 NOTE — Assessment & Plan Note (Signed)
Repeat labs ordered

## 2023-09-22 LAB — CBC WITH DIFFERENTIAL/PLATELET
Basophils Absolute: 0 10*3/uL (ref 0.0–0.2)
Basos: 1 %
EOS (ABSOLUTE): 0 10*3/uL (ref 0.0–0.4)
Eos: 1 %
Hematocrit: 39.4 % (ref 34.0–46.6)
Hemoglobin: 12.8 g/dL (ref 11.1–15.9)
Immature Grans (Abs): 0 10*3/uL (ref 0.0–0.1)
Immature Granulocytes: 0 %
Lymphocytes Absolute: 1.1 10*3/uL (ref 0.7–3.1)
Lymphs: 25 %
MCH: 29.2 pg (ref 26.6–33.0)
MCHC: 32.5 g/dL (ref 31.5–35.7)
MCV: 90 fL (ref 79–97)
Monocytes Absolute: 0.6 10*3/uL (ref 0.1–0.9)
Monocytes: 14 %
Neutrophils Absolute: 2.6 10*3/uL (ref 1.4–7.0)
Neutrophils: 59 %
Platelets: 252 10*3/uL (ref 150–450)
RBC: 4.38 x10E6/uL (ref 3.77–5.28)
RDW: 13.6 % (ref 11.7–15.4)
WBC: 4.4 10*3/uL (ref 3.4–10.8)

## 2023-09-22 LAB — COMPREHENSIVE METABOLIC PANEL
ALT: 14 [IU]/L (ref 0–32)
AST: 28 [IU]/L (ref 0–40)
Albumin: 4.4 g/dL (ref 3.8–4.9)
Alkaline Phosphatase: 82 [IU]/L (ref 44–121)
BUN/Creatinine Ratio: 16 (ref 9–23)
BUN: 14 mg/dL (ref 6–24)
Bilirubin Total: 0.5 mg/dL (ref 0.0–1.2)
CO2: 25 mmol/L (ref 20–29)
Calcium: 9.2 mg/dL (ref 8.7–10.2)
Chloride: 103 mmol/L (ref 96–106)
Creatinine, Ser: 0.89 mg/dL (ref 0.57–1.00)
Globulin, Total: 2.2 g/dL (ref 1.5–4.5)
Glucose: 74 mg/dL (ref 70–99)
Potassium: 4.4 mmol/L (ref 3.5–5.2)
Sodium: 140 mmol/L (ref 134–144)
Total Protein: 6.6 g/dL (ref 6.0–8.5)
eGFR: 76 mL/min/{1.73_m2} (ref >59)

## 2023-09-22 LAB — INSULIN, FREE AND TOTAL
Free Insulin: 7.9 uU/mL
Total Insulin: 7.9 uU/mL

## 2023-09-22 LAB — IRON,TIBC AND FERRITIN PANEL
Ferritin: 13 ng/mL — ABNORMAL LOW (ref 15–150)
Iron Saturation: 18 % (ref 15–55)
Iron: 89 ug/dL (ref 27–159)
Total Iron Binding Capacity: 495 ug/dL — ABNORMAL HIGH (ref 250–450)
UIBC: 406 ug/dL (ref 131–425)

## 2023-09-22 LAB — FSH/LH
FSH: 11.2 m[IU]/mL
LH: 10 m[IU]/mL

## 2023-09-22 LAB — ESTRADIOL: Estradiol: 35.6 pg/mL

## 2023-09-22 LAB — TSH: TSH: 1.62 u[IU]/mL (ref 0.450–4.500)

## 2023-09-22 LAB — VITAMIN B12: Vitamin B-12: 1103 pg/mL (ref 232–1245)

## 2023-09-22 LAB — HEMOGLOBIN A1C
Est. average glucose Bld gHb Est-mCnc: 123 mg/dL
Hgb A1c MFr Bld: 5.9 % — ABNORMAL HIGH (ref 4.8–5.6)

## 2023-09-22 LAB — T4, FREE: Free T4: 1.07 ng/dL (ref 0.82–1.77)

## 2023-09-22 LAB — LIPID PANEL
Chol/HDL Ratio: 3.3 ratio (ref 0.0–4.4)
Cholesterol, Total: 200 mg/dL — ABNORMAL HIGH (ref 100–199)
HDL: 61 mg/dL (ref >39)
LDL Chol Calc (NIH): 118 mg/dL — ABNORMAL HIGH (ref 0–99)
Triglycerides: 119 mg/dL (ref 0–149)
VLDL Cholesterol Cal: 21 mg/dL (ref 5–40)

## 2023-09-22 LAB — VITAMIN D 25 HYDROXY (VIT D DEFICIENCY, FRACTURES): Vit D, 25-Hydroxy: 25.4 ng/mL — ABNORMAL LOW (ref 30.0–100.0)

## 2023-09-24 ENCOUNTER — Other Ambulatory Visit: Payer: Self-pay | Admitting: Nurse Practitioner

## 2023-09-24 DIAGNOSIS — F339 Major depressive disorder, recurrent, unspecified: Secondary | ICD-10-CM

## 2023-09-24 DIAGNOSIS — F411 Generalized anxiety disorder: Secondary | ICD-10-CM

## 2023-09-24 DIAGNOSIS — F332 Major depressive disorder, recurrent severe without psychotic features: Secondary | ICD-10-CM

## 2023-09-24 DIAGNOSIS — F50812 Binge eating disorder, severe: Secondary | ICD-10-CM

## 2023-09-27 NOTE — Telephone Encounter (Signed)
 Last apt 09/03/23.

## 2023-09-28 MED ORDER — LISDEXAMFETAMINE DIMESYLATE 20 MG PO CAPS: 20.0000 mg | ORAL_CAPSULE | Freq: Every day | ORAL | 0 refills | Status: DC

## 2023-09-28 MED ORDER — LISDEXAMFETAMINE DIMESYLATE 20 MG PO CAPS
20.0000 mg | ORAL_CAPSULE | Freq: Every day | ORAL | 0 refills | Status: DC
Start: 2023-09-28 — End: 2023-11-01

## 2023-09-28 MED ORDER — VITAMIN D (ERGOCALCIFEROL) 1.25 MG (50000 UNIT) PO CAPS: 50000.0000 [IU] | ORAL_CAPSULE | ORAL | 3 refills | Status: DC

## 2023-10-10 ENCOUNTER — Other Ambulatory Visit: Payer: Self-pay | Admitting: Nurse Practitioner

## 2023-10-10 DIAGNOSIS — E2839 Other primary ovarian failure: Secondary | ICD-10-CM

## 2023-10-11 ENCOUNTER — Encounter: Payer: Self-pay | Admitting: Nurse Practitioner

## 2023-10-11 ENCOUNTER — Other Ambulatory Visit: Payer: Self-pay | Admitting: Nurse Practitioner

## 2023-10-11 DIAGNOSIS — F4312 Post-traumatic stress disorder, chronic: Secondary | ICD-10-CM

## 2023-10-11 DIAGNOSIS — F332 Major depressive disorder, recurrent severe without psychotic features: Secondary | ICD-10-CM

## 2023-10-11 DIAGNOSIS — F5101 Primary insomnia: Secondary | ICD-10-CM

## 2023-10-11 DIAGNOSIS — F411 Generalized anxiety disorder: Secondary | ICD-10-CM

## 2023-10-11 DIAGNOSIS — F339 Major depressive disorder, recurrent, unspecified: Secondary | ICD-10-CM

## 2023-10-11 DIAGNOSIS — F4000 Agoraphobia, unspecified: Secondary | ICD-10-CM

## 2023-10-11 DIAGNOSIS — F449 Dissociative and conversion disorder, unspecified: Secondary | ICD-10-CM

## 2023-10-11 NOTE — Telephone Encounter (Signed)
Is this okay to refill? 

## 2023-10-12 MED ORDER — ZOLPIDEM TARTRATE 10 MG PO TABS: 10.0000 mg | ORAL_TABLET | Freq: Every evening | ORAL | 3 refills | Status: DC | PRN

## 2023-10-14 ENCOUNTER — Other Ambulatory Visit: Payer: Self-pay | Admitting: Nurse Practitioner

## 2023-10-14 DIAGNOSIS — F332 Major depressive disorder, recurrent severe without psychotic features: Secondary | ICD-10-CM

## 2023-10-14 DIAGNOSIS — F411 Generalized anxiety disorder: Secondary | ICD-10-CM

## 2023-10-19 MED ORDER — ZOLPIDEM TARTRATE 10 MG PO TABS: 10.0000 mg | ORAL_TABLET | Freq: Every day | ORAL | 2 refills | Status: DC

## 2023-10-31 ENCOUNTER — Encounter: Payer: Self-pay | Admitting: Nurse Practitioner

## 2023-10-31 DIAGNOSIS — F332 Major depressive disorder, recurrent severe without psychotic features: Secondary | ICD-10-CM

## 2023-10-31 DIAGNOSIS — F50812 Binge eating disorder, severe: Secondary | ICD-10-CM

## 2023-11-01 MED ORDER — LISDEXAMFETAMINE DIMESYLATE 30 MG PO CAPS: 30.0000 mg | ORAL_CAPSULE | Freq: Every day | ORAL | 0 refills | Status: DC

## 2023-11-11 ENCOUNTER — Other Ambulatory Visit: Payer: Self-pay | Admitting: Nurse Practitioner

## 2023-11-11 DIAGNOSIS — F339 Major depressive disorder, recurrent, unspecified: Secondary | ICD-10-CM

## 2023-11-11 DIAGNOSIS — F411 Generalized anxiety disorder: Secondary | ICD-10-CM

## 2023-11-11 NOTE — Telephone Encounter (Signed)
 Last apt 09/03/23.

## 2023-12-02 ENCOUNTER — Encounter: Payer: Self-pay | Admitting: Nurse Practitioner

## 2023-12-02 ENCOUNTER — Other Ambulatory Visit: Payer: Self-pay | Admitting: Nurse Practitioner

## 2023-12-02 DIAGNOSIS — F50812 Binge eating disorder, severe: Secondary | ICD-10-CM

## 2023-12-02 DIAGNOSIS — G43009 Migraine without aura, not intractable, without status migrainosus: Secondary | ICD-10-CM

## 2023-12-02 DIAGNOSIS — F339 Major depressive disorder, recurrent, unspecified: Secondary | ICD-10-CM

## 2023-12-02 DIAGNOSIS — F332 Major depressive disorder, recurrent severe without psychotic features: Secondary | ICD-10-CM

## 2023-12-02 DIAGNOSIS — F411 Generalized anxiety disorder: Secondary | ICD-10-CM

## 2023-12-02 DIAGNOSIS — F4312 Post-traumatic stress disorder, chronic: Secondary | ICD-10-CM

## 2023-12-02 DIAGNOSIS — F449 Dissociative and conversion disorder, unspecified: Secondary | ICD-10-CM

## 2023-12-02 DIAGNOSIS — F4000 Agoraphobia, unspecified: Secondary | ICD-10-CM

## 2023-12-03 MED ORDER — LISDEXAMFETAMINE DIMESYLATE 30 MG PO CAPS
30.0000 mg | ORAL_CAPSULE | Freq: Every day | ORAL | 0 refills | Status: DC
Start: 2023-12-31 — End: 2024-01-21

## 2023-12-03 MED ORDER — CARIPRAZINE HCL 3 MG PO CAPS
3.0000 mg | ORAL_CAPSULE | Freq: Every day | ORAL | 3 refills | Status: AC
Start: 2023-12-03 — End: ?

## 2023-12-03 MED ORDER — LISDEXAMFETAMINE DIMESYLATE 30 MG PO CAPS: 30.0000 mg | ORAL_CAPSULE | Freq: Every day | ORAL | 0 refills | Status: DC

## 2023-12-03 MED ORDER — BUPROPION HCL ER (XL) 150 MG PO TB24: ORAL_TABLET | ORAL | 0 refills | Status: DC

## 2023-12-03 MED ORDER — GABAPENTIN 300 MG PO CAPS: ORAL_CAPSULE | ORAL | 3 refills | Status: AC

## 2023-12-10 ENCOUNTER — Other Ambulatory Visit: Payer: Self-pay | Admitting: Nurse Practitioner

## 2023-12-10 DIAGNOSIS — G4489 Other headache syndrome: Secondary | ICD-10-CM

## 2023-12-10 NOTE — Telephone Encounter (Signed)
 Last apt 09/03/23.

## 2024-01-18 ENCOUNTER — Encounter: Payer: Self-pay | Admitting: Nurse Practitioner

## 2024-01-21 ENCOUNTER — Other Ambulatory Visit: Payer: Self-pay | Admitting: Nurse Practitioner

## 2024-01-21 DIAGNOSIS — F339 Major depressive disorder, recurrent, unspecified: Secondary | ICD-10-CM

## 2024-01-21 DIAGNOSIS — F332 Major depressive disorder, recurrent severe without psychotic features: Secondary | ICD-10-CM

## 2024-01-21 DIAGNOSIS — F4000 Agoraphobia, unspecified: Secondary | ICD-10-CM

## 2024-01-21 DIAGNOSIS — F5101 Primary insomnia: Secondary | ICD-10-CM

## 2024-01-21 DIAGNOSIS — F4312 Post-traumatic stress disorder, chronic: Secondary | ICD-10-CM

## 2024-01-21 DIAGNOSIS — F449 Dissociative and conversion disorder, unspecified: Secondary | ICD-10-CM

## 2024-01-21 DIAGNOSIS — F50812 Binge eating disorder, severe: Secondary | ICD-10-CM

## 2024-01-21 DIAGNOSIS — F411 Generalized anxiety disorder: Secondary | ICD-10-CM

## 2024-01-21 MED ORDER — ZOLPIDEM TARTRATE 10 MG PO TABS: 10.0000 mg | ORAL_TABLET | Freq: Every evening | ORAL | 3 refills | Status: DC | PRN

## 2024-01-21 MED ORDER — LISDEXAMFETAMINE DIMESYLATE 30 MG PO CAPS: 30.0000 mg | ORAL_CAPSULE | Freq: Every day | ORAL | 0 refills | Status: DC

## 2024-01-24 ENCOUNTER — Other Ambulatory Visit: Payer: Self-pay

## 2024-01-24 DIAGNOSIS — F332 Major depressive disorder, recurrent severe without psychotic features: Secondary | ICD-10-CM

## 2024-01-24 DIAGNOSIS — F50812 Binge eating disorder, severe: Secondary | ICD-10-CM

## 2024-01-24 DIAGNOSIS — F4312 Post-traumatic stress disorder, chronic: Secondary | ICD-10-CM

## 2024-01-24 DIAGNOSIS — F5101 Primary insomnia: Secondary | ICD-10-CM

## 2024-01-24 DIAGNOSIS — F411 Generalized anxiety disorder: Secondary | ICD-10-CM

## 2024-01-24 DIAGNOSIS — F4000 Agoraphobia, unspecified: Secondary | ICD-10-CM

## 2024-01-24 DIAGNOSIS — F449 Dissociative and conversion disorder, unspecified: Secondary | ICD-10-CM

## 2024-01-24 DIAGNOSIS — F339 Major depressive disorder, recurrent, unspecified: Secondary | ICD-10-CM

## 2024-03-21 ENCOUNTER — Other Ambulatory Visit: Payer: Self-pay | Admitting: Nurse Practitioner

## 2024-03-21 ENCOUNTER — Encounter: Payer: Self-pay | Admitting: Nurse Practitioner

## 2024-03-21 DIAGNOSIS — F411 Generalized anxiety disorder: Secondary | ICD-10-CM

## 2024-03-21 DIAGNOSIS — F339 Major depressive disorder, recurrent, unspecified: Secondary | ICD-10-CM

## 2024-03-21 NOTE — Telephone Encounter (Signed)
 Is this okay to refill?

## 2024-04-14 ENCOUNTER — Other Ambulatory Visit: Payer: Self-pay | Admitting: Nurse Practitioner

## 2024-04-14 DIAGNOSIS — F332 Major depressive disorder, recurrent severe without psychotic features: Secondary | ICD-10-CM

## 2024-04-14 DIAGNOSIS — F411 Generalized anxiety disorder: Secondary | ICD-10-CM

## 2024-04-14 NOTE — Telephone Encounter (Signed)
 Last apt 09/13/23

## 2024-04-25 ENCOUNTER — Other Ambulatory Visit: Payer: Self-pay | Admitting: Nurse Practitioner

## 2024-04-25 DIAGNOSIS — F449 Dissociative and conversion disorder, unspecified: Secondary | ICD-10-CM

## 2024-04-25 DIAGNOSIS — F5101 Primary insomnia: Secondary | ICD-10-CM

## 2024-04-25 DIAGNOSIS — F339 Major depressive disorder, recurrent, unspecified: Secondary | ICD-10-CM

## 2024-04-25 DIAGNOSIS — F332 Major depressive disorder, recurrent severe without psychotic features: Secondary | ICD-10-CM

## 2024-04-25 DIAGNOSIS — F4312 Post-traumatic stress disorder, chronic: Secondary | ICD-10-CM

## 2024-04-25 DIAGNOSIS — F4000 Agoraphobia, unspecified: Secondary | ICD-10-CM

## 2024-04-25 DIAGNOSIS — F411 Generalized anxiety disorder: Secondary | ICD-10-CM

## 2024-04-25 NOTE — Telephone Encounter (Signed)
 Last apt 09/03/23.

## 2024-06-07 ENCOUNTER — Encounter: Payer: Self-pay | Admitting: Nurse Practitioner

## 2024-06-07 DIAGNOSIS — F332 Major depressive disorder, recurrent severe without psychotic features: Secondary | ICD-10-CM

## 2024-06-07 DIAGNOSIS — F50812 Binge eating disorder, severe: Secondary | ICD-10-CM

## 2024-06-09 MED ORDER — LISDEXAMFETAMINE DIMESYLATE 40 MG PO CAPS: 40.0000 mg | ORAL_CAPSULE | Freq: Every day | ORAL | 0 refills | Status: DC

## 2024-06-09 NOTE — Telephone Encounter (Signed)
 Patient returned missed call. Called [facility] - Melissa out. Scheduled virtual appt.

## 2024-06-26 ENCOUNTER — Telehealth: Payer: Self-pay

## 2024-06-26 NOTE — Telephone Encounter (Signed)
 New RX request : Ambien  @ [facility] [address] [place]

## 2024-06-27 ENCOUNTER — Other Ambulatory Visit: Payer: Self-pay | Admitting: Nurse Practitioner

## 2024-06-27 ENCOUNTER — Telehealth: Payer: Self-pay

## 2024-06-27 DIAGNOSIS — F332 Major depressive disorder, recurrent severe without psychotic features: Secondary | ICD-10-CM

## 2024-06-27 DIAGNOSIS — F339 Major depressive disorder, recurrent, unspecified: Secondary | ICD-10-CM

## 2024-06-27 DIAGNOSIS — F5101 Primary insomnia: Secondary | ICD-10-CM

## 2024-06-27 DIAGNOSIS — F411 Generalized anxiety disorder: Secondary | ICD-10-CM

## 2024-06-27 DIAGNOSIS — F449 Dissociative and conversion disorder, unspecified: Secondary | ICD-10-CM

## 2024-06-27 DIAGNOSIS — F4000 Agoraphobia, unspecified: Secondary | ICD-10-CM

## 2024-06-27 DIAGNOSIS — F4312 Post-traumatic stress disorder, chronic: Secondary | ICD-10-CM

## 2024-06-27 NOTE — Telephone Encounter (Signed)
 It looks like the pt. Should still ave refills left on this

## 2024-06-27 NOTE — Telephone Encounter (Signed)
 RX request  for Ambien  @ [facility] [address] in [place]

## 2024-06-28 MED ORDER — ZOLPIDEM TARTRATE 10 MG PO TABS: 10.0000 mg | ORAL_TABLET | Freq: Every evening | ORAL | 5 refills | Status: AC | PRN

## 2024-07-20 ENCOUNTER — Other Ambulatory Visit: Payer: Self-pay | Admitting: Nurse Practitioner

## 2024-07-20 DIAGNOSIS — F332 Major depressive disorder, recurrent severe without psychotic features: Secondary | ICD-10-CM

## 2024-07-20 DIAGNOSIS — F411 Generalized anxiety disorder: Secondary | ICD-10-CM

## 2024-07-20 NOTE — Telephone Encounter (Signed)
 Last apt 08/24/23 next apt 08/24/24.

## 2024-08-10 LAB — OPHTHALMOLOGY REPORT-SCANNED

## 2024-08-17 ENCOUNTER — Encounter: Payer: Self-pay | Admitting: Family

## 2024-08-24 ENCOUNTER — Telehealth (INDEPENDENT_AMBULATORY_CARE_PROVIDER_SITE_OTHER): Payer: Self-pay | Admitting: Nurse Practitioner

## 2024-08-24 VITALS — Wt 285.0 lb

## 2024-08-24 DIAGNOSIS — Z0000 Encounter for general adult medical examination without abnormal findings: Secondary | ICD-10-CM

## 2024-08-24 DIAGNOSIS — F411 Generalized anxiety disorder: Secondary | ICD-10-CM | POA: Diagnosis not present

## 2024-08-24 DIAGNOSIS — G43009 Migraine without aura, not intractable, without status migrainosus: Secondary | ICD-10-CM

## 2024-08-24 DIAGNOSIS — F339 Major depressive disorder, recurrent, unspecified: Secondary | ICD-10-CM

## 2024-08-24 DIAGNOSIS — F4000 Agoraphobia, unspecified: Secondary | ICD-10-CM | POA: Diagnosis not present

## 2024-08-24 DIAGNOSIS — F50812 Binge eating disorder, severe: Secondary | ICD-10-CM

## 2024-08-24 MED ORDER — ALPRAZOLAM 0.5 MG PO TABS: 0.5000 mg | ORAL_TABLET | Freq: Three times a day (TID) | ORAL | 2 refills | Status: AC | PRN

## 2024-08-24 MED ORDER — LISDEXAMFETAMINE DIMESYLATE 50 MG PO CAPS: 50.0000 mg | ORAL_CAPSULE | Freq: Every day | ORAL | 0 refills | Status: AC

## 2024-08-24 NOTE — Progress Notes (Signed)
 Virtual Visit Encounter mychart visit.   I connected with  Judith Holland on 09/04/24 at  1:30 PM EST by secure video and audio telemedicine application. I verified that I am speaking with the correct person using two identifiers.   I introduced myself as a Publishing Rights Manager with the practice. The limitations of evaluation and management by telemedicine discussed with the patient and the availability of in person appointments. The patient expressed verbal understanding and consent to proceed.  Participating parties in this visit include: Myself and patient  The patient is: Patient Location: Home I am: Provider Location: Office/Clinic Subjective:    CC and HPI: History of Present Illness Judith Holland is a 58 year old female who presents with ongoing vision issues and anxiety management.  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 Although the surgeries have been completed, her vision has not fully recovered, particularly in the central vision. She retains good peripheral vision and has good vision in the other eye, which had previously undergone PRK surgery.  She experiences significant anxiety, particularly in unexpected social settings, which makes her feel very anxious. She is currently not working and stays home to manage her anxiety better. She feels anxious when socializing, such as when her son brought his in-laws over unexpectedly. She also canceled a mammogram appointment due to anxiety from having too many outings in a row.  She is managing an eating disorder and is taking Vyvanse , which initially helped with appetite control but has become less effective over time. She lost about 20 pounds initially with a meal plan from her nutritionist but has since regained some weight. She struggles with  impulse eating, especially when home alone, and has adjusted her Vyvanse  intake to help manage this. She follows a meal plan of three meals and two snacks per day, focusing on protein intake in the morning to prevent overeating later.  She is currently taking several medications including Xanax , Wellbutrin , Vraylar , Pristiq , gabapentin , propranolol , and Ambien . She takes propranolol  once a day to prevent headaches and has extra on hand. Her repeat labs were delayed due to her eye surgery.  Past medical history, Surgical history, Family history not pertinant except as noted below, Social history, Allergies, and medications have been entered into the medical record, reviewed, and corrections made.   Review of Systems:  All review of systems negative except what is listed in the HPI  Objective:    Alert and oriented x 4 Flat affect.  Speaking in clear sentences with no shortness of breath. No distress.  Impression and Recommendations:    Problem List Items Addressed This Visit     Migraine headache without aura   Managed with propranolol , taken once daily with additional doses as needed. She has an adequate supply of medication. Seeing Dr. Skeet historically.  - Continue propranolol  as needed for headache management.      Depression, recurrent   She reports some improvement in mood and coping mechanisms. Managed with therapy services on routine basis with good rapport with counselor.  - Continue current antidepressant regimen.      Relevant Medications   ALPRAZolam  (XANAX ) 0.5 MG tablet   Healthcare maintenance   She is due for flu and shingles vaccines. Labs were ordered but not completed due to recent eye surgery. - Will administer  flu and second shingles vaccine at next office visit. - Will complete pending lab work at next office visit.      Generalized anxiety disorder   Exacerbated by unexpected social situations and stress. She manages anxiety by staying home and avoiding  triggers. Therapy has improved coping mechanisms. - Continue therapy with Emmalene for anxiety management. - Continue current medication regimen including Xanax  as needed.      Relevant Medications   ALPRAZolam  (XANAX ) 0.5 MG tablet   Agoraphobia   Exacerbated by unexpected social situations and stress. She manages anxiety by staying home and avoiding triggers. Therapy has improved coping mechanisms. Therapy is consistent and ongoing at this time.  - Continue therapy with Emmalene for anxiety management. - Continue current medication regimen including Xanax  as needed.      Relevant Medications   ALPRAZolam  (XANAX ) 0.5 MG tablet   Severe binge-eating disorder - Primary   Managed with Vyvanse , initially effective but now less so. She has lost 20 pounds with a meal plan but regained 5 pounds post-surgery. Stress and social situations exacerbate symptoms. Vyvanse  helps with impulse control, especially when taken closer to lunchtime. She is managed with weekly counseling and dietician services for complete care.  - Continue working with nutritionist Leita on meal planning. - Continue therapy with Emmalene for coping strategies.       Relevant Medications   lisdexamfetamine (VYVANSE ) 50 MG capsule (Start on 09/21/2024)   lisdexamfetamine (VYVANSE ) 50 MG capsule   lisdexamfetamine (VYVANSE ) 50 MG capsule (Start on 10/19/2024)    current treatment plan is effective, no change in therapy, orders and follow up as documented in EMR I discussed the assessment and treatment plan with the patient. The patient was provided an opportunity to ask questions and all were answered. The patient agreed with the plan and demonstrated an understanding of the instructions.   The patient was advised to call back or seek an in-person evaluation if the symptoms worsen or if the condition fails to improve as anticipated.  Follow-Up: in 6 months  I provided 32 minutes of non-face-to-face interaction with this non  face-to-face encounter including intake, same-day documentation, and chart review.   Camie CHARLENA Doing, NP , DNP, AGNP-c [facility]

## 2024-09-04 NOTE — Assessment & Plan Note (Signed)
 Managed with propranolol , taken once daily with additional doses as needed. She has an adequate supply of medication. Seeing Dr. Skeet historically.  - Continue propranolol  as needed for headache management.

## 2024-09-04 NOTE — Assessment & Plan Note (Signed)
 She reports some improvement in mood and coping mechanisms. Managed with therapy services on routine basis with good rapport with counselor.  - Continue current antidepressant regimen.

## 2024-09-04 NOTE — Assessment & Plan Note (Signed)
 She is due for flu and shingles vaccines. Labs were ordered but not completed due to recent eye surgery. - Will administer flu and second shingles vaccine at next office visit. - Will complete pending lab work at next office visit.

## 2024-09-04 NOTE — Assessment & Plan Note (Signed)
 Exacerbated by unexpected social situations and stress. She manages anxiety by staying home and avoiding triggers. Therapy has improved coping mechanisms. Therapy is consistent and ongoing at this time.  - Continue therapy with Judith Holland for anxiety management. - Continue current medication regimen including Xanax  as needed.

## 2024-09-04 NOTE — Assessment & Plan Note (Signed)
 Exacerbated by unexpected social situations and stress. She manages anxiety by staying home and avoiding triggers. Therapy has improved coping mechanisms. - Continue therapy with Judith Holland for anxiety management. - Continue current medication regimen including Xanax  as needed.

## 2024-09-04 NOTE — Assessment & Plan Note (Signed)
 Managed with Vyvanse , initially effective but now less so. She has lost 20 pounds with a meal plan but regained 5 pounds post-surgery. Stress and social situations exacerbate symptoms. Vyvanse  helps with impulse control, especially when taken closer to lunchtime. She is managed with weekly counseling and dietician services for complete care.  - Continue working with nutritionist Leita on meal planning. - Continue therapy with Emmalene for coping strategies.

## 2024-09-13 ENCOUNTER — Other Ambulatory Visit: Payer: Self-pay | Admitting: Nurse Practitioner

## 2024-09-13 DIAGNOSIS — E559 Vitamin D deficiency, unspecified: Secondary | ICD-10-CM

## 2024-09-13 NOTE — Telephone Encounter (Signed)
 Will refill after she comes in for labs next week.

## 2024-09-17 ENCOUNTER — Other Ambulatory Visit: Payer: Self-pay | Admitting: Nurse Practitioner

## 2024-09-17 DIAGNOSIS — G4489 Other headache syndrome: Secondary | ICD-10-CM

## 2024-09-19 ENCOUNTER — Other Ambulatory Visit: Payer: Self-pay

## 2024-09-26 ENCOUNTER — Ambulatory Visit: Payer: Self-pay

## 2024-09-26 DIAGNOSIS — Z23 Encounter for immunization: Secondary | ICD-10-CM

## 2024-09-27 ENCOUNTER — Other Ambulatory Visit: Payer: Self-pay

## 2024-09-27 ENCOUNTER — Ambulatory Visit: Payer: Self-pay | Admitting: Nurse Practitioner

## 2024-09-27 DIAGNOSIS — K909 Intestinal malabsorption, unspecified: Secondary | ICD-10-CM

## 2024-09-27 DIAGNOSIS — R7989 Other specified abnormal findings of blood chemistry: Secondary | ICD-10-CM

## 2024-09-27 DIAGNOSIS — E559 Vitamin D deficiency, unspecified: Secondary | ICD-10-CM

## 2024-09-27 LAB — COMPREHENSIVE METABOLIC PANEL WITH GFR
ALT: 20 IU/L (ref 0–32)
AST: 22 IU/L (ref 0–40)
Albumin: 4.2 g/dL (ref 3.8–4.9)
Alkaline Phosphatase: 84 IU/L (ref 49–135)
BUN/Creatinine Ratio: 17 (ref 9–23)
BUN: 17 mg/dL (ref 6–24)
Bilirubin Total: 0.6 mg/dL (ref 0.0–1.2)
CO2: 26 mmol/L (ref 20–29)
Calcium: 9.4 mg/dL (ref 8.7–10.2)
Chloride: 103 mmol/L (ref 96–106)
Creatinine, Ser: 1.01 mg/dL — AB (ref 0.57–1.00)
Globulin, Total: 2.3 g/dL (ref 1.5–4.5)
Glucose: 101 mg/dL — AB (ref 70–99)
Potassium: 4.4 mmol/L (ref 3.5–5.2)
Sodium: 140 mmol/L (ref 134–144)
Total Protein: 6.5 g/dL (ref 6.0–8.5)
eGFR: 65 mL/min/1.73 (ref >59)

## 2024-09-27 LAB — CBC WITH DIFFERENTIAL/PLATELET
Basophils Absolute: 0 x10E3/uL (ref 0.0–0.2)
Basos: 0 %
EOS (ABSOLUTE): 0 x10E3/uL (ref 0.0–0.4)
Eos: 1 %
Hematocrit: 41.6 % (ref 34.0–46.6)
Hemoglobin: 13.9 g/dL (ref 11.1–15.9)
Immature Grans (Abs): 0 x10E3/uL (ref 0.0–0.1)
Immature Granulocytes: 0 %
Lymphocytes Absolute: 1 x10E3/uL (ref 0.7–3.1)
Lymphs: 28 %
MCH: 31.5 pg (ref 26.6–33.0)
MCHC: 33.4 g/dL (ref 31.5–35.7)
MCV: 94 fL (ref 79–97)
Monocytes Absolute: 0.4 x10E3/uL (ref 0.1–0.9)
Monocytes: 10 %
Neutrophils Absolute: 2.2 x10E3/uL (ref 1.4–7.0)
Neutrophils: 61 %
Platelets: 225 x10E3/uL (ref 150–450)
RBC: 4.41 x10E6/uL (ref 3.77–5.28)
RDW: 12.5 % (ref 11.7–15.4)
WBC: 3.6 x10E3/uL (ref 3.4–10.8)

## 2024-09-27 LAB — HEMOGLOBIN A1C
Est. average glucose Bld gHb Est-mCnc: 111 mg/dL
Hgb A1c MFr Bld: 5.5 % (ref 4.8–5.6)

## 2024-09-27 LAB — IRON,TIBC AND FERRITIN PANEL
Ferritin: 19 ng/mL (ref 15–150)
Iron Saturation: 35 % (ref 15–55)
Iron: 145 ug/dL (ref 27–159)
Total Iron Binding Capacity: 418 ug/dL (ref 250–450)
UIBC: 273 ug/dL (ref 131–425)

## 2024-09-27 LAB — VITAMIN D 25 HYDROXY (VIT D DEFICIENCY, FRACTURES): Vit D, 25-Hydroxy: 26.8 ng/mL — AB (ref 30.0–100.0)

## 2024-09-27 MED ORDER — VITAMIN D (ERGOCALCIFEROL) 1.25 MG (50000 UNIT) PO CAPS: 50000.0000 [IU] | ORAL_CAPSULE | ORAL | 1 refills | Status: AC

## 2024-11-17 ENCOUNTER — Other Ambulatory Visit: Payer: Self-pay | Admitting: Nurse Practitioner

## 2024-11-17 DIAGNOSIS — F339 Major depressive disorder, recurrent, unspecified: Secondary | ICD-10-CM

## 2024-11-17 DIAGNOSIS — F411 Generalized anxiety disorder: Secondary | ICD-10-CM
